# Patient Record
Sex: Female | Born: 1935
Health system: Southern US, Community
[De-identification: ages and names within clinical notes are randomized; demographics above are authoritative.]

## PROBLEM LIST (undated history)

## (undated) DIAGNOSIS — T8149XA Infection following a procedure, other surgical site, initial encounter: Secondary | ICD-10-CM

## (undated) DIAGNOSIS — G309 Alzheimer's disease, unspecified: Secondary | ICD-10-CM

## (undated) DIAGNOSIS — E7800 Pure hypercholesterolemia, unspecified: Secondary | ICD-10-CM

## (undated) DIAGNOSIS — F0280 Dementia in other diseases classified elsewhere without behavioral disturbance: Secondary | ICD-10-CM

## (undated) DIAGNOSIS — T8859XA Other complications of anesthesia, initial encounter: Secondary | ICD-10-CM

## (undated) DIAGNOSIS — T4145XA Adverse effect of unspecified anesthetic, initial encounter: Secondary | ICD-10-CM

## (undated) DIAGNOSIS — Z9289 Personal history of other medical treatment: Secondary | ICD-10-CM

## (undated) DIAGNOSIS — E119 Type 2 diabetes mellitus without complications: Secondary | ICD-10-CM

## (undated) DIAGNOSIS — J449 Chronic obstructive pulmonary disease, unspecified: Secondary | ICD-10-CM

## (undated) HISTORY — PX: DILATION AND CURETTAGE OF UTERUS: SHX78

## (undated) HISTORY — PX: CATARACT EXTRACTION W/ INTRAOCULAR LENS  IMPLANT, BILATERAL: SHX1307

---

## 1976-07-25 HISTORY — PX: CHOLECYSTECTOMY: SHX55

## 1984-07-25 HISTORY — PX: APPENDECTOMY: SHX54

## 1994-07-25 DIAGNOSIS — Z9289 Personal history of other medical treatment: Secondary | ICD-10-CM

## 1994-07-25 HISTORY — DX: Personal history of other medical treatment: Z92.89

## 1996-07-25 DIAGNOSIS — E119 Type 2 diabetes mellitus without complications: Secondary | ICD-10-CM

## 1996-07-25 HISTORY — DX: Type 2 diabetes mellitus without complications: E11.9

## 2016-06-15 ENCOUNTER — Encounter (HOSPITAL_COMMUNITY): Payer: Self-pay | Admitting: Emergency Medicine

## 2016-06-15 ENCOUNTER — Inpatient Hospital Stay (HOSPITAL_COMMUNITY)
Admission: EM | Admit: 2016-06-15 | Discharge: 2016-06-17 | DRG: 638 | Disposition: A | Discharge status: Home / Self Care | Payer: Medicare (Managed Care) | Attending: Internal Medicine | Admitting: Internal Medicine

## 2016-06-15 DIAGNOSIS — R350 Frequency of micturition: Secondary | ICD-10-CM | POA: Diagnosis present

## 2016-06-15 DIAGNOSIS — G309 Alzheimer's disease, unspecified: Secondary | ICD-10-CM | POA: Diagnosis present

## 2016-06-15 DIAGNOSIS — Z9049 Acquired absence of other specified parts of digestive tract: Secondary | ICD-10-CM

## 2016-06-15 DIAGNOSIS — N390 Urinary tract infection, site not specified: Secondary | ICD-10-CM | POA: Diagnosis present

## 2016-06-15 DIAGNOSIS — E1165 Type 2 diabetes mellitus with hyperglycemia: Principal | ICD-10-CM | POA: Diagnosis present

## 2016-06-15 DIAGNOSIS — E11621 Type 2 diabetes mellitus with foot ulcer: Secondary | ICD-10-CM | POA: Diagnosis present

## 2016-06-15 DIAGNOSIS — E7800 Pure hypercholesterolemia, unspecified: Secondary | ICD-10-CM | POA: Diagnosis present

## 2016-06-15 DIAGNOSIS — J449 Chronic obstructive pulmonary disease, unspecified: Secondary | ICD-10-CM | POA: Diagnosis present

## 2016-06-15 DIAGNOSIS — N3000 Acute cystitis without hematuria: Secondary | ICD-10-CM | POA: Diagnosis not present

## 2016-06-15 DIAGNOSIS — Z91013 Allergy to seafood: Secondary | ICD-10-CM

## 2016-06-15 DIAGNOSIS — Z794 Long term (current) use of insulin: Secondary | ICD-10-CM

## 2016-06-15 DIAGNOSIS — Z9842 Cataract extraction status, left eye: Secondary | ICD-10-CM

## 2016-06-15 DIAGNOSIS — L89899 Pressure ulcer of other site, unspecified stage: Secondary | ICD-10-CM | POA: Diagnosis not present

## 2016-06-15 DIAGNOSIS — Z880 Allergy status to penicillin: Secondary | ICD-10-CM

## 2016-06-15 DIAGNOSIS — Z885 Allergy status to narcotic agent status: Secondary | ICD-10-CM

## 2016-06-15 DIAGNOSIS — R262 Difficulty in walking, not elsewhere classified: Secondary | ICD-10-CM

## 2016-06-15 DIAGNOSIS — Z820 Family history of epilepsy and other diseases of the nervous system: Secondary | ICD-10-CM

## 2016-06-15 DIAGNOSIS — Z1623 Resistance to quinolones and fluoroquinolones: Secondary | ICD-10-CM | POA: Diagnosis present

## 2016-06-15 DIAGNOSIS — L89612 Pressure ulcer of right heel, stage 2: Secondary | ICD-10-CM | POA: Diagnosis present

## 2016-06-15 DIAGNOSIS — R32 Unspecified urinary incontinence: Secondary | ICD-10-CM | POA: Diagnosis present

## 2016-06-15 DIAGNOSIS — E119 Type 2 diabetes mellitus without complications: Secondary | ICD-10-CM | POA: Diagnosis present

## 2016-06-15 DIAGNOSIS — L89892 Pressure ulcer of other site, stage 2: Secondary | ICD-10-CM | POA: Diagnosis not present

## 2016-06-15 DIAGNOSIS — F0280 Dementia in other diseases classified elsewhere without behavioral disturbance: Secondary | ICD-10-CM | POA: Diagnosis present

## 2016-06-15 DIAGNOSIS — E878 Other disorders of electrolyte and fluid balance, not elsewhere classified: Secondary | ICD-10-CM | POA: Diagnosis present

## 2016-06-15 DIAGNOSIS — L89891 Pressure ulcer of other site, stage 1: Secondary | ICD-10-CM | POA: Diagnosis present

## 2016-06-15 DIAGNOSIS — Z9841 Cataract extraction status, right eye: Secondary | ICD-10-CM

## 2016-06-15 DIAGNOSIS — L89152 Pressure ulcer of sacral region, stage 2: Secondary | ICD-10-CM | POA: Diagnosis present

## 2016-06-15 DIAGNOSIS — M6281 Muscle weakness (generalized): Secondary | ICD-10-CM

## 2016-06-15 DIAGNOSIS — R739 Hyperglycemia, unspecified: Secondary | ICD-10-CM | POA: Diagnosis not present

## 2016-06-15 DIAGNOSIS — Z961 Presence of intraocular lens: Secondary | ICD-10-CM | POA: Diagnosis present

## 2016-06-15 DIAGNOSIS — E875 Hyperkalemia: Secondary | ICD-10-CM | POA: Diagnosis present

## 2016-06-15 DIAGNOSIS — E871 Hypo-osmolality and hyponatremia: Secondary | ICD-10-CM | POA: Diagnosis present

## 2016-06-15 HISTORY — DX: Personal history of other medical treatment: Z92.89

## 2016-06-15 HISTORY — DX: Type 2 diabetes mellitus without complications: E11.9

## 2016-06-15 HISTORY — DX: Other complications of anesthesia, initial encounter: T88.59XA

## 2016-06-15 HISTORY — DX: Pure hypercholesterolemia, unspecified: E78.00

## 2016-06-15 HISTORY — DX: Adverse effect of unspecified anesthetic, initial encounter: T41.45XA

## 2016-06-15 HISTORY — DX: Chronic obstructive pulmonary disease, unspecified: J44.9

## 2016-06-15 HISTORY — DX: Alzheimer's disease, unspecified: G30.9

## 2016-06-15 HISTORY — DX: Dementia in other diseases classified elsewhere, unspecified severity, without behavioral disturbance, psychotic disturbance, mood disturbance, and anxiety: F02.80

## 2016-06-15 LAB — CBC WITH DIFFERENTIAL/PLATELET
Basophils Absolute: 0 K/uL (ref 0.0–0.1)
Basophils Relative: 0 %
Eosinophils Absolute: 0 K/uL (ref 0.0–0.7)
Eosinophils Relative: 1 %
HCT: 41.8 % (ref 36.0–46.0)
Hemoglobin: 14 g/dL (ref 12.0–15.0)
Lymphocytes Relative: 22 %
Lymphs Abs: 1.9 K/uL (ref 0.7–4.0)
MCH: 30.4 pg (ref 26.0–34.0)
MCHC: 33.5 g/dL (ref 30.0–36.0)
MCV: 90.9 fL (ref 78.0–100.0)
Monocytes Absolute: 1 K/uL (ref 0.1–1.0)
Monocytes Relative: 11 %
Neutro Abs: 5.7 K/uL (ref 1.7–7.7)
Neutrophils Relative %: 66 %
Platelets: 223 K/uL (ref 150–400)
RBC: 4.6 MIL/uL (ref 3.87–5.11)
RDW: 13.9 % (ref 11.5–15.5)
WBC: 8.6 K/uL (ref 4.0–10.5)

## 2016-06-15 LAB — BASIC METABOLIC PANEL WITH GFR
Anion gap: 11 (ref 5–15)
BUN: 22 mg/dL — ABNORMAL HIGH (ref 6–20)
CO2: 25 mmol/L (ref 22–32)
Calcium: 10 mg/dL (ref 8.9–10.3)
Chloride: 97 mmol/L — ABNORMAL LOW (ref 101–111)
Creatinine, Ser: 0.94 mg/dL (ref 0.44–1.00)
GFR calc Af Amer: >60 mL/min
GFR calc non Af Amer: 54 mL/min — ABNORMAL LOW
Glucose, Bld: 503 mg/dL (ref 65–99)
Potassium: 5.4 mmol/L — ABNORMAL HIGH (ref 3.5–5.1)
Sodium: 133 mmol/L — ABNORMAL LOW (ref 135–145)

## 2016-06-15 LAB — URINALYSIS, ROUTINE W REFLEX MICROSCOPIC
Bilirubin Urine: NEGATIVE
Glucose, UA: >1000 mg/dL — AB
Ketones, ur: NEGATIVE mg/dL
LEUKOCYTES UA: NEGATIVE
Nitrite: POSITIVE — AB
PROTEIN: NEGATIVE mg/dL
Specific Gravity, Urine: 1.029 (ref 1.005–1.030)
pH: 5.5 (ref 5.0–8.0)

## 2016-06-15 LAB — URINE MICROSCOPIC-ADD ON

## 2016-06-15 LAB — I-STAT VENOUS BLOOD GAS, ED
Acid-Base Excess: 4 mmol/L — ABNORMAL HIGH (ref 0.0–2.0)
Bicarbonate: 30.3 mmol/L — ABNORMAL HIGH (ref 20.0–28.0)
O2 Saturation: 26 %
TCO2: 32 mmol/L (ref 0–100)
pCO2, Ven: 50.3 mmHg (ref 44.0–60.0)
pH, Ven: 7.387 (ref 7.250–7.430)
pO2, Ven: 18 mmHg — CL (ref 32.0–45.0)

## 2016-06-15 LAB — GLUCOSE, CAPILLARY: Glucose-Capillary: 308 mg/dL — ABNORMAL HIGH (ref 65–99)

## 2016-06-15 LAB — CBG MONITORING, ED
Glucose-Capillary: 509 mg/dL (ref 65–99)
Glucose-Capillary: 346 mg/dL — ABNORMAL HIGH (ref 65–99)

## 2016-06-15 MED ORDER — ACETAMINOPHEN 650 MG RE SUPP: 650.0000 mg | Freq: Four times a day (QID) | RECTAL | Status: DC | PRN

## 2016-06-15 MED ORDER — INSULIN ASPART 100 UNIT/ML ~~LOC~~ SOLN
0.0000 [IU] | SUBCUTANEOUS | Status: DC
  Administered 2016-06-15: 7 [IU] via SUBCUTANEOUS
  Administered 2016-06-16: 3 [IU] via SUBCUTANEOUS
  Administered 2016-06-16: 1 [IU] via SUBCUTANEOUS
  Administered 2016-06-16: 5 [IU] via SUBCUTANEOUS
  Administered 2016-06-16 – 2016-06-17 (×3): 9 [IU] via SUBCUTANEOUS
  Administered 2016-06-17: 5 [IU] via SUBCUTANEOUS
  Administered 2016-06-17: 3 [IU] via SUBCUTANEOUS

## 2016-06-15 MED ORDER — ENSURE ENLIVE PO LIQD
237.0000 mL | Freq: Two times a day (BID) | ORAL | Status: DC
  Administered 2016-06-16: 237 mL via ORAL

## 2016-06-15 MED ORDER — ENOXAPARIN SODIUM 30 MG/0.3ML ~~LOC~~ SOLN
30.0000 mg | SUBCUTANEOUS | Status: DC
Start: 2016-06-15 — End: 2016-06-17
  Administered 2016-06-15: 30 mg via SUBCUTANEOUS
  Filled 2016-06-15 (×2): qty 0.3

## 2016-06-15 MED ORDER — ACETAMINOPHEN 325 MG PO TABS
650.0000 mg | ORAL_TABLET | Freq: Four times a day (QID) | ORAL | Status: DC | PRN
  Administered 2016-06-16: 650 mg via ORAL
  Filled 2016-06-15: qty 2

## 2016-06-15 MED ORDER — ONDANSETRON HCL 4 MG PO TABS: 4.0000 mg | ORAL_TABLET | Freq: Four times a day (QID) | ORAL | Status: DC | PRN

## 2016-06-15 MED ORDER — CLONAZEPAM 1 MG PO TABS
1.0000 mg | ORAL_TABLET | Freq: Every day | ORAL | Status: DC | PRN
  Administered 2016-06-15: 1 mg via ORAL
  Filled 2016-06-15: qty 1

## 2016-06-15 MED ORDER — SODIUM CHLORIDE 0.9 % IV BOLUS (SEPSIS)
1000.0000 mL | Freq: Once | INTRAVENOUS | Status: AC
  Administered 2016-06-15: 1000 mL via INTRAVENOUS

## 2016-06-15 MED ORDER — ONDANSETRON HCL 4 MG/2ML IJ SOLN: 4.0000 mg | Freq: Four times a day (QID) | INTRAMUSCULAR | Status: DC | PRN

## 2016-06-15 MED ORDER — SODIUM CHLORIDE 0.9 % IV SOLN: INTRAVENOUS | Status: AC

## 2016-06-15 MED ORDER — CIPROFLOXACIN HCL 500 MG PO TABS
250.0000 mg | ORAL_TABLET | Freq: Two times a day (BID) | ORAL | Status: DC
  Administered 2016-06-15 – 2016-06-17 (×3): 250 mg via ORAL
  Filled 2016-06-15 (×4): qty 1

## 2016-06-15 MED ORDER — SENNA 8.6 MG PO TABS
1.0000 | ORAL_TABLET | Freq: Two times a day (BID) | ORAL | Status: DC
  Administered 2016-06-15 – 2016-06-17 (×3): 8.6 mg via ORAL
  Filled 2016-06-15 (×4): qty 1
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
## 2016-06-16 DIAGNOSIS — L89152 Pressure ulcer of sacral region, stage 2: Secondary | ICD-10-CM | POA: Diagnosis present

## 2016-06-16 DIAGNOSIS — N3000 Acute cystitis without hematuria: Secondary | ICD-10-CM

## 2016-06-16 DIAGNOSIS — L8992 Pressure ulcer of unspecified site, stage 2: Secondary | ICD-10-CM

## 2016-06-16 DIAGNOSIS — L89899 Pressure ulcer of other site, unspecified stage: Secondary | ICD-10-CM

## 2016-06-16 DIAGNOSIS — L89892 Pressure ulcer of other site, stage 2: Secondary | ICD-10-CM

## 2016-06-16 DIAGNOSIS — L89891 Pressure ulcer of other site, stage 1: Secondary | ICD-10-CM | POA: Diagnosis present

## 2016-06-16 DIAGNOSIS — R739 Hyperglycemia, unspecified: Secondary | ICD-10-CM

## 2016-06-16 DIAGNOSIS — L89109 Pressure ulcer of unspecified part of back, unspecified stage: Secondary | ICD-10-CM

## 2016-06-16 DIAGNOSIS — R7309 Other abnormal glucose: Secondary | ICD-10-CM

## 2016-06-16 DIAGNOSIS — N390 Urinary tract infection, site not specified: Secondary | ICD-10-CM | POA: Diagnosis present

## 2016-06-16 LAB — GLUCOSE, CAPILLARY
GLUCOSE-CAPILLARY: 129 mg/dL — AB (ref 65–99)
GLUCOSE-CAPILLARY: 282 mg/dL — AB (ref 65–99)
Glucose-Capillary: 206 mg/dL — ABNORMAL HIGH (ref 65–99)
Glucose-Capillary: 456 mg/dL — ABNORMAL HIGH (ref 65–99)
Glucose-Capillary: 374 mg/dL — ABNORMAL HIGH (ref 65–99)

## 2016-06-16 LAB — BASIC METABOLIC PANEL
ANION GAP: 8 (ref 5–15)
BUN: 21 mg/dL — ABNORMAL HIGH (ref 6–20)
CALCIUM: 9 mg/dL (ref 8.9–10.3)
CO2: 23 mmol/L (ref 22–32)
Chloride: 105 mmol/L (ref 101–111)
Creatinine, Ser: 1.02 mg/dL — ABNORMAL HIGH (ref 0.44–1.00)
GFR calc non Af Amer: 49 mL/min — ABNORMAL LOW (ref >60)
GFR, EST AFRICAN AMERICAN: 56 mL/min — AB (ref >60)
GLUCOSE: 257 mg/dL — AB (ref 65–99)
POTASSIUM: 3.6 mmol/L (ref 3.5–5.1)
Sodium: 136 mmol/L (ref 135–145)

## 2016-06-16 LAB — CBC
HEMATOCRIT: 37.3 % (ref 36.0–46.0)
HEMOGLOBIN: 12.6 g/dL (ref 12.0–15.0)
MCH: 30.6 pg (ref 26.0–34.0)
MCHC: 33.8 g/dL (ref 30.0–36.0)
MCV: 90.5 fL (ref 78.0–100.0)
Platelets: 177 10*3/uL (ref 150–400)
RBC: 4.12 MIL/uL (ref 3.87–5.11)
RDW: 14.1 % (ref 11.5–15.5)
WBC: 6.6 10*3/uL (ref 4.0–10.5)

## 2016-06-16 MED ORDER — LORAZEPAM 2 MG/ML IJ SOLN: 1.0000 mg | Freq: Once | INTRAMUSCULAR | Status: DC

## 2016-06-16 MED ORDER — HALOPERIDOL LACTATE 5 MG/ML IJ SOLN
2.0000 mg | Freq: Once | INTRAMUSCULAR | Status: AC
  Administered 2016-06-16: 2 mg via INTRAMUSCULAR
  Filled 2016-06-16: qty 1

## 2016-06-16 MED ORDER — GLUCERNA SHAKE PO LIQD
237.0000 mL | Freq: Two times a day (BID) | ORAL | Status: DC
  Administered 2016-06-16 – 2016-06-17 (×2): 237 mL via ORAL
  Filled 2016-06-16: qty 237

## 2016-06-16 MED ORDER — INSULIN ASPART 100 UNIT/ML ~~LOC~~ SOLN
12.0000 [IU] | Freq: Once | SUBCUTANEOUS | Status: AC
Start: 2016-06-16 — End: 2016-06-16
  Administered 2016-06-16: 12 [IU] via SUBCUTANEOUS

## 2016-06-16 NOTE — Progress Notes (Signed)
Paged MD at [phone] and [phone] to inform of CBG of 456.   Dr. Samuella CotaSvalina up on the floor and new orders to be placed.  Will continue to monitor.  Forbes Cellarelcine Jeweline Reif, RN
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
## 2016-06-16 NOTE — Progress Notes (Signed)
   Subjective:  Patient sleeping - information gathered from daughter. She states that Ms. Herzberg has not complained about anything since admission. Daughter had questions about wound care, what to look out for when going home in terms of change in status of wounds, and having the patient sit up and ambulated. We explained that wound care was going to come by and give suggestions for care of her pressure ulcers, but that right now they don't appear infected and are healing well. PT was consulted to evaluate patient - though with holiday scheduling, unsure if they will come today; patient may be up with assistance with nursing and ambulated.  We began discussion on importance of diabetes management and taking insulin on a regular schedule. Daughter understands that we will keep monitoring her sugars to be able to evaluate her insulin need at discharge.   Objective:  Vital signs in last 24 hours: Vitals:   06/15/16 1246 06/15/16 1314 06/15/16 1700 06/16/16 0513  BP: 159/71 161/97 (!) 165/69 (!) 165/56  Pulse: 76 78 61 (!) 55  Resp: 17 17 17 18   Temp:  98.8 F (37.1 C) 99 F (37.2 C) 97.5 F (36.4 C)  TempSrc:  Oral Oral Oral  SpO2: 99% 99% 98% 98%  Weight:   48.4 kg (106 lb 11.2 oz)    Constitutional: asleep, vs reviewed CV: RRR, LUSB systolic murmur, no rubs or gallops appreciated Resp: CTAB, no increased work of breathing, no wheezing or crackles Abd: soft, NDNT, +BS Skin: dressings in place over R heel and sacrum  Assessment/Plan:  Active Problems:   Hyperglycemia   Pressure injury of skin  Hyperglycemia: Checking CBG's --CBG and SSI-S q4hr to establish insulin requirement --started discussion on management of diabetes in this patient with family who administer her meds and care for her; diabetes educator consulted for additional help --f/u AM BMP  UTI:  Patient with increased frequency of urination and a UA positive for nitrites and bacteria (also squamous cells)  consistent with UTI.  --cipro 250mg  BID x 7 days --f/u UCx  Pressure ulcers: Patient with well healing stage 2 pressure ulcers on sacrum and R heel that do not appear to be infected. Patient is afebrile and without leukocytosis.  --wound care consulted for suggestions  Dementia: Patient with dementia x 13 years, thought to be Alzheimer's by family. She has never had a stroke to their knowledge. Patient is dependent on family for ADL's but can assist with feeding. With severe dementia and uncontrolled diabetes likely due to lack of knowledge of management, patient [facility].  --monitor for delirium --PT/OT consult - appreciate their assistance  --will address resources family might need for caring of patient   Dispo: Anticipated discharge in approximately 1 day(s).   Nyra MarketGorica Johnny Latu, MD 06/16/2016, 12:06 PM Pager [phone]

## 2016-06-16 NOTE — Progress Notes (Signed)
Attempted CBG check x2. Pt educated and refused both times.

## 2016-06-16 NOTE — Plan of Care (Signed)
Problem: Activity: Goal: Risk for activity intolerance will decrease Outcome: Progressing Sat up in chair several times today

## 2016-06-17 LAB — BASIC METABOLIC PANEL
ANION GAP: 8 (ref 5–15)
BUN: 25 mg/dL — ABNORMAL HIGH (ref 6–20)
CHLORIDE: 104 mmol/L (ref 101–111)
CO2: 25 mmol/L (ref 22–32)
Calcium: 9.8 mg/dL (ref 8.9–10.3)
Creatinine, Ser: 0.94 mg/dL (ref 0.44–1.00)
GFR calc Af Amer: >60 mL/min (ref >60)
GFR, EST NON AFRICAN AMERICAN: 54 mL/min — AB (ref >60)
Glucose, Bld: 283 mg/dL — ABNORMAL HIGH (ref 65–99)
POTASSIUM: 3.9 mmol/L (ref 3.5–5.1)
SODIUM: 137 mmol/L (ref 135–145)

## 2016-06-17 LAB — URINE CULTURE: Culture: >=100000 — AB

## 2016-06-17 LAB — GLUCOSE, CAPILLARY
GLUCOSE-CAPILLARY: 208 mg/dL — AB (ref 65–99)
GLUCOSE-CAPILLARY: 362 mg/dL — AB (ref 65–99)
GLUCOSE-CAPILLARY: 369 mg/dL — AB (ref 65–99)
Glucose-Capillary: 282 mg/dL — ABNORMAL HIGH (ref 65–99)

## 2016-06-17 MED ORDER — NITROFURANTOIN MONOHYD MACRO 100 MG PO CAPS
100.0000 mg | ORAL_CAPSULE | Freq: Two times a day (BID) | ORAL | Status: DC
  Administered 2016-06-17: 100 mg via ORAL
  Filled 2016-06-17 (×2): qty 1

## 2016-06-17 MED ORDER — INSULIN ASPART PROT & ASPART (70-30 MIX) 100 UNIT/ML ~~LOC~~ SUSP: SUBCUTANEOUS | 11 refills | Status: DC

## 2016-06-17 MED ORDER — BACITRACIN-NEOMYCIN-POLYMYXIN OINTMENT TUBE: 1.0000 "application " | TOPICAL_OINTMENT | Freq: Every day | CUTANEOUS | 6 refills | Status: DC

## 2016-06-17 MED ORDER — BACITRACIN-NEOMYCIN-POLYMYXIN OINTMENT TUBE
TOPICAL_OINTMENT | Freq: Every day | CUTANEOUS | Status: DC
  Filled 2016-06-17: qty 15

## 2016-06-17 MED ORDER — ADULT MULTIVITAMIN W/MINERALS CH: 1.0000 | ORAL_TABLET | Freq: Every day | ORAL | Status: DC

## 2016-06-17 MED ORDER — ADULT MULTIVITAMIN W/MINERALS CH: 1.0000 | ORAL_TABLET | Freq: Every day | ORAL | 5 refills | Status: DC

## 2016-06-17 MED ORDER — NITROFURANTOIN MONOHYD MACRO 100 MG PO CAPS: 100.0000 mg | ORAL_CAPSULE | Freq: Two times a day (BID) | ORAL | 0 refills | Status: AC

## 2016-06-17 MED ORDER — INSULIN GLARGINE 100 UNIT/ML ~~LOC~~ SOLN
20.0000 [IU] | Freq: Every day | SUBCUTANEOUS | Status: DC
  Filled 2016-06-17: qty 0.2

## 2016-06-17 MED ORDER — GLUCERNA SHAKE PO LIQD: 237.0000 mL | Freq: Two times a day (BID) | ORAL | 2 refills | Status: AC

## 2016-06-17 MED ORDER — PRO-STAT SUGAR FREE PO LIQD: 30.0000 mL | Freq: Two times a day (BID) | ORAL | Status: DC

## 2016-06-17 NOTE — Consult Note (Addendum)
WOC Nurse wound consult note Reason for Consult: Consult requested for sacrum and right heel.  Family member states these developed in another hospital prior to admission.  They have been using Neosporin to the right heel and Dermagraph ointment to sacrum Q day to promote healing. This topical treatment is not available in the [facility] formulary; advised family they can bring it in from home for use every day if desired. Pt is very emaciated and has multiple systemic factors which can impair healing. Wound type: Sacrum with stage 3 pressure injury; 5X3.5X.1cm, 90% red and moist, 10% yellow.  Small amt yellow drainage, no odor Right heel with unstageable pressure injury; 5X2cm, 80% red, 20% yellow slough, small amt yellow drainage, no odor. Pressure Ulcer POA: Yes Dressing procedure/placement/frequency: Assessed wounds at the bedside with family member. Continue present plan of care as discussed above.  Float heels to reduce pressure and position off sacrum as often as possible.  Encouraged patient to increase protein intake.  Family denies further questions. Please re-consult if further assistance is needed.  Thank-you,  Cammie Mcgeeawn Kayline Sheer MSN, RN, CWOCN, [place]AP, CNS [phone]

## 2016-06-17 NOTE — Progress Notes (Signed)
   06/17/16 1045  Clinical Encounter Type  Visited With Patient and family together  Visit Type Other (Comment) ([place] consult)  Referral From Nurse  Consult/Referral To Chaplain  Spiritual Encounters  Spiritual Needs Emotional;Prayer  Stress Factors  Patient Stress Factors Health changes  Family Stress Factors Family relationships  Introductions made. Informed Pt and daughter that notary and volunteers unavailable to complete Ad. Provided copy and explained procedures. Offered prayer during visit.

## 2016-06-17 NOTE — Progress Notes (Signed)
Inpatient Diabetes Program Recommendations  AACE/ADA: New Consensus Statement on Inpatient Glycemic Control (2015)  Target Ranges:  Prepandial:   less than 140 mg/dL      Peak postprandial:   less than 180 mg/dL (1-2 hours)      Critically ill patients:  140 - 180 mg/dL   Lab Results  Component Value Date   GLUCAP 282 (H) 06/17/2016   HGBA1C 10.5 (H) 06/15/2016    Review of Glycemic Control:  Results for Sharon BeringGOSTO, Sharon (MRN [id number]) as of 06/17/2016 08:11  Ref. Range 06/16/2016 08:22 06/16/2016 12:25 06/16/2016 16:46 06/17/2016 00:23 06/17/2016 04:51  Glucose-Capillary Latest Ref Range: 65 - 99 mg/dL 409129 (H) 811456 (H) 914374 (H) 369 (H) 282 (H)    Diabetes history: Type 2 diabetes Outpatient Diabetes medications: Lantus 20 units daily-prn when on trips?, Humalog 15 units daily prn? Current orders for Inpatient glycemic control:  Novolog sensitive q 4 hours  Inpatient Diabetes Program Recommendations:   Please consider restarting a portion of Lantus.  Consider Lantus 12 units daily.  Will talk to family/patient today.   Thanks, Beryl MeagerJenny Amberlee Garvey, RN, BC-ADM Inpatient Diabetes Coordinator Pager [phone] (8a-5p)

## 2016-06-17 NOTE — Clinical Social Work Note (Signed)
CSW consulted for "Dispo" PT is not recommending any follow up. CSW updated RNCM. CSW is signing off as no further needs identified.   Dede QuerySarah Danyle Boening, MSW, LCSW Clinical Social Worker  [phone]
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
## 2016-06-17 NOTE — Progress Notes (Signed)
  Date: 06/17/2016  Patient name: Sharon Butler  Medical record number: [id number]  Date of birth: 09/29/1928   I have personally seen and evaluated this patient and the plan of care was discussed with the house staff. Please see Dr. Kandice HamsSvalina's note for complete details. I concur with her findings.  Inez CatalinaEmily B Mullen, MD 06/17/2016, 11:31 AM

## 2016-06-17 NOTE — Care Management Note (Signed)
Case Management Note  Patient Details  Name: Fabio BeringRamona Corradi MRN: [id number] Date of Birth: 09/29/1928  Subjective/Objective:                 Spoke with patients daughter at the bedside. Patient recently moved from [place] to live with daughter. Patient has "[employer] Medicare" per daughter in [place]. Insurance [facility]. Per clinical liaison, insurance may be approved Monday. CM discussed with daughter priorities g=for home health, as she may end up as self pay. Daughter stated she felt like patient only needed a few RN visits to address sacral decub, PT was not necessary. (CM witnessed pt get up OOB to straighten out sheets and return to bed, steady and unassisted.) Daughter states that the walker and rolator they have at home already would be sufficient and declined need for additional DME.  Spoke with resident, after she spoke with daughter in hallway. Plan is for daughter to call FM clinic to schedule appointment. Daughter also given clinical liaison for [facility] direct number to call Monday after she establishes her medicare so that [facility] either private pay or through insurance can be confirmed.   Action/Plan:  DC to home with daughter.   Expected Discharge Date:                  Expected Discharge Plan:  Home/Self Care  In-House Referral:  Clinical Social Work  Discharge planning Services  CM Consult  Post Acute Care Choice:    Choice offered to:  Adult Children  DME Arranged:    DME Agency:     HH Arranged:    HH Agency:     Status of Service:  Completed, signed off  If discussed at Long Length of Stay Meetings, dates discussed:    Additional Comments:  Lawerance SabalDebbie Yuchen Fedor, RN 06/17/2016, 4:48 PM

## 2016-06-17 NOTE — Progress Notes (Signed)
   Subjective:  Patient required 40U insulin over the last 24hrs. Patient is sleeping so history obtained from husband. He states that she has refused some medicines yesterday. This is frustrating to him as he does not think that she is getting anything here compared to at home.  Objective:  Vital signs in last 24 hours: Vitals:   06/15/16 1700 06/16/16 0513 06/16/16 1459 06/17/16 0455  BP: (!) 165/69 (!) 165/56 (!) 138/44 (!) 137/51  Pulse: 61 (!) 55 75 63  Resp: 17 18 20 18   Temp: 99 F (37.2 C) 97.5 F (36.4 C) 98.5 F (36.9 C) 98.1 F (36.7 C)  TempSrc: Oral Oral Oral   SpO2: 98% 98% 97% 96%  Weight: 48.4 kg (106 lb 11.2 oz)   48.2 kg (106 lb 3.2 oz)   Constitutional: asleep, vs reviewed CV: RRR, LUSB systolic murmur, no rubs or gallops appreciated Resp: CTAB, no increased work of breathing, no wheezing or crackles Abd: soft, NDNT, +BS Skin: dressings in place over R heel and sacrum  Assessment/Plan:  Active Problems:   Hyperglycemia   Pressure injury of skin   Decubitus ulcer of right foot, stage 2   Decubitus ulcer of sacral region, stage 2   UTI (urinary tract infection)  Hyperglycemia: Checking CBG's; patient refused a couple of CBG checks, but we were still able to get a good amount of info; She required 40U insulin over the last 24hrs. Will start Lantus 20U qhs; and meal time SSI. At discharge, plan on Lantus and metformin with close f/u with PCP for titration of meds --started discussion on management of diabetes in this patient with family who administer her meds and care for her; diabetes educator consulted for additional help --Lantus 20U, SSI-s TID wc --at discharge, Lantus 20U qhs, metformin 500mg  daily to be titrated up with PCP  UTI:  Patient with increased frequency of urination and a UA positive for nitrites and bacteria (also squamous cells) consistent with UTI. Culture positive for cipro and bactrim resistant E coli; patient had anaphylactic reaction  to penicillin so will start macrobid --macrobid 100mg  BID x 7days  Pressure ulcers: Patient with well healing stage 2 pressure ulcers on sacrum and R heel that do not appear to be infected. Patient is afebrile and without leukocytosis.  --wound care consulted for suggestions  Dementia: Patient with dementia x 13 years, thought to be Alzheimer's by family. She has never had a stroke to their knowledge. Patient is dependent on family for ADL's but can assist with feeding. With severe dementia and uncontrolled diabetes likely due to lack of knowledge of management, patient will likely [facility].  --monitor for delirium --PT/OT consult - appreciate their assistance  --will address resources family might need for caring of patient; they [facility]/RN  Dispo: Anticipated discharge today.   Nyra MarketGorica Joely Losier, MD 06/17/2016, 7:27 AM Pager [phone]

## 2016-06-17 NOTE — Evaluation (Signed)
Occupational Therapy Evaluation Patient Details Name: Sharon Butler MRN: [id number] DOB: 09/29/1928 Today's Date: 06/17/2016    History of Present Illness 82 yo F with a significant PMH of T2DM, currently on insulin, and Alzheimer's that presented to the ED with hyperglycemia.   Clinical Impression   Per family, pt has been receiving assist with sponge bathing and dressing PTA. Currently requires mod assist for functional mobility with cues for sequencing and safety. Pt able to perform ADL at overall min assist. Pt planning to d/c home with 24/7 supervision from family. Pt would benefit from continued skilled OT to address established goals.    Follow Up Recommendations  No OT follow up;Supervision/Assistance - 24 hour    Equipment Recommendations  None recommended by OT    Recommendations for Other Services       Precautions / Restrictions Precautions Precautions: Fall Restrictions Weight Bearing Restrictions: No      Mobility Bed Mobility Overal bed mobility: Needs Assistance Bed Mobility: Supine to Sit     Supine to sit: Min assist     General bed mobility comments: Min hand held assist to pull up into sitting position.  Transfers Overall transfer level: Needs assistance Equipment used: 1 person hand held assist Transfers: Sit to/from UGI CorporationStand;Stand Pivot Transfers Sit to Stand: Min assist Stand pivot transfers: Mod assist       General transfer comment: Cues for seqencing and safety.    Balance Overall balance assessment: Needs assistance Sitting-balance support: Feet supported;No upper extremity supported Sitting balance-Leahy Scale: Good     Standing balance support: Single extremity supported Standing balance-Leahy Scale: Fair                              ADL Overall ADL's : Needs assistance/impaired Eating/Feeding: Set up;Sitting   Grooming: Min guard;Sitting   Upper Body Bathing: Minimal assitance;Sitting   Lower Body Bathing:  Minimal assistance;Sit to/from stand Lower Body Bathing Details (indicate cue type and reason): Pt able to wash LB sitting on BSC then with min assist in standing for balance Upper Body Dressing : Min guard;Sitting Upper Body Dressing Details (indicate cue type and reason): to don gown Lower Body Dressing: Maximal assistance;Bed level Lower Body Dressing Details (indicate cue type and reason): to don/doff socks due to sore on R heel. Toilet Transfer: Moderate assistance;Stand-pivot;BSC;Cueing for sequencing   Toileting- Clothing Manipulation and Hygiene: Moderate assistance;Sit to/from stand Toileting - Clothing Manipulation Details (indicate cue type and reason): pt able to perform peri care in standing with min assist for balance. Assist to start underwear over feet; pt able to pull up in standing.     Functional mobility during ADLs: Moderate assistance;Rolling walker (hand held assist)       Vision Additional Comments: Appears WFL.   Perception     Praxis      Pertinent Vitals/Pain Pain Assessment: No/denies pain     Hand Dominance     Extremity/Trunk Assessment Upper Extremity Assessment Upper Extremity Assessment: Generalized weakness   Lower Extremity Assessment Lower Extremity Assessment: Defer to PT evaluation   Cervical / Trunk Assessment Cervical / Trunk Assessment: Kyphotic   Communication Communication Communication: No difficulties   Cognition Arousal/Alertness: Awake/alert Behavior During Therapy: WFL for tasks assessed/performed Overall Cognitive Status: History of cognitive impairments - at baseline                     General Comments  Exercises       Shoulder Instructions      Home Living Family/patient expects to be discharged to:: Private residence Living Arrangements: Children;Spouse/significant other Available Help at Discharge: Family;Available 24 hours/day Type of Home: House Home Access: Stairs to enter ITT IndustriesEntrance  Stairs-Number of Steps: 3 Entrance Stairs-Rails: None Home Layout: One level     Bathroom Shower/Tub: Chief Strategy OfficerTub/shower unit   Bathroom Toilet: Standard     Home Equipment: Environmental consultantWalker - 4 wheels;Cane - single point;Bedside commode;Shower seat          Prior Functioning/Environment Level of Independence: Needs assistance  Gait / Transfers Assistance Needed: Uses rollator for mobility. ADL's / Homemaking Assistance Needed: Family assists with sponge bathing and dressing. Has fallen when trying to use the commode so now only uses BSC; incontinent PTA.            OT Problem List: Decreased strength;Decreased activity tolerance;Impaired balance (sitting and/or standing);Decreased cognition;Decreased safety awareness;Decreased knowledge of use of DME or AE;Pain   OT Treatment/Interventions: Self-care/ADL training;Energy conservation;DME and/or AE instruction;Therapeutic activities;Patient/family education    OT Goals(Current goals can be found in the care plan section) Acute Rehab OT Goals Patient Stated Goal: none stated by pt OT Goal Formulation: With patient/family Time For Goal Achievement: 07/01/16 Potential to Achieve Goals: Good ADL Goals Pt Will Perform Grooming: with supervision;standing Pt Will Perform Upper Body Bathing: with supervision;sitting Pt Will Perform Lower Body Bathing: with supervision;sit to/from stand Pt Will Perform Upper Body Dressing: with supervision;sitting Pt Will Perform Lower Body Dressing: with supervision;sit to/from stand Pt Will Transfer to Toilet: with supervision;ambulating;bedside commode Pt Will Perform Toileting - Clothing Manipulation and hygiene: with supervision;sit to/from stand  OT Frequency: Min 2X/week   Barriers to D/C:            Co-evaluation PT/OT/SLP Co-Evaluation/Treatment: Yes Reason for Co-Treatment: For patient/therapist safety;Complexity of the patient's impairments (multi-system involvement)   OT goals addressed during  session: ADL's and self-care      End of Session Equipment Utilized During Treatment: Gait belt;Rolling walker Nurse Communication: Mobility status  Activity Tolerance: Patient tolerated treatment well Patient left: in chair;with call bell/phone within reach;with chair alarm set;with family/visitor present   Time: 1025-1055 OT Time Calculation (min): 30 min Charges:  OT General Charges $OT Visit: 1 Procedure OT Evaluation $OT Eval Moderate Complexity: 1 Procedure G-Codes:     Sharon AlkenBailey A Magdelene Butler M.S., OTR/L Pager: [phone]  06/17/2016, 11:21 AM

## 2016-06-17 NOTE — Discharge Instructions (Signed)
The case manager will follow up with you to set up home health, and also to set up with a new primary care doctor. The clinics are closed today so we are unable to schedule an appointment.

## 2016-06-17 NOTE — Progress Notes (Signed)
Initial Nutrition Assessment  DOCUMENTATION CODES:   Not applicable  INTERVENTION:   -Continue Glucerna Shake po BID, each supplement provides 220 kcal and 10 grams of protein -30 ml Prostat BID, each supplement provides 100 kcals and 15 grams protein -MVI daily  NUTRITION DIAGNOSIS:   Increased nutrient needs related to wound healing as evidenced by estimated needs.  GOAL:   Patient will meet greater than or equal to 90% of their needs  MONITOR:   PO intake, Supplement acceptance, Weight trends, Skin, I & O's  REASON FOR ASSESSMENT:   Consult, Malnutrition Screening Tool Assessment of nutrition requirement/status  ASSESSMENT:   Fabio BeringRamona Bow is a 82 yo F with a significant PMH of T2DM, currently on insulin, and Alzheimer's that presented to the ED with hyperglycemia, pressure ulcers and dirty UA.  Pt admitted with hyperglycemia, pressure ulcers, and UTI.  Pt unavailable at time of visit. Unable to complete Nutrition-Focused physical exam at this time.   Per chart review, pt was recently [place] in [place] for FTT and DM management, where she developed pressure ulcers. Pt with hx of weight loss, however, has apparently regained some lost weight. On driver's license, UBW noted as 130#. Pt appeared frail and thin at time of visit. RD suspects malnutrition, however, unable to confirm at this time.   Per RN notes, pt has been refusing some medications, but does better with encouragement from family and prayer.   Per [facility] note, pt with stage II pressure injury on sacrum and UN rt heel. RD will add protein modular and MVI to promote wound healing.   Labs reviewed: CBGS: [id number].  Diet Order:  Diet Carb Modified Fluid consistency: Thin; Room service appropriate? Yes Diet - low sodium heart healthy  Skin:  Wound (see comment) (st II sacrum, UN rt heel)  Last BM:  06/14/16  Height:   Ht Readings from Last 1 Encounters:  06/17/16 5\' 2"  (1.575 m)    Weight:    Wt Readings from Last 1 Encounters:  06/17/16 106 lb 3.2 oz (48.2 kg)    Ideal Body Weight:  50 kg  BMI:  Body mass index is 19.42 kg/m.  Estimated Nutritional Needs:   Kcal:  1450-1650  Protein:  65-80 grams  Fluid:  >1.4 L  EDUCATION NEEDS:   No education needs identified at this time  Javaughn Opdahl A. Mayford KnifeWilliams, RD, LDN, CDE Pager: [phone] After hours Pager: [phone]

## 2016-06-17 NOTE — Evaluation (Signed)
Physical Therapy Evaluation Patient Details Name: Fabio BeringRamona Whetsell MRN: [id number] DOB: 09/29/1928 Today's Date: 06/17/2016   History of Present Illness  82 yo F with a significant PMH of T2DM, currently on insulin, and Alzheimer's that presented to the ED with hyperglycemia, UTI, ongoing pressure ulcer issues.  Clinical Impression  Pt is able to get up and move about the room assisted, which sounds close to her baseline given her dementia.  Daughter is very hands on with her care.  Pt would benefit from PT to follow her acutely, but will likely not need it upon d/c given her significant care at home.   PT to follow acutely for deficits listed below.       Follow Up Recommendations No PT follow up    Equipment Recommendations  None recommended by PT    Recommendations for Other Services   NA    Precautions / Restrictions Precautions Precautions: Fall Precaution Comments: h/o falls PTA, uses rollator Restrictions Weight Bearing Restrictions: No      Mobility  Bed Mobility Overal bed mobility: Needs Assistance Bed Mobility: Supine to Sit     Supine to sit: Min assist     General bed mobility comments: Min hand held assist to give pt something to pull on to get to sitting EOB.    Transfers Overall transfer level: Needs assistance Equipment used: Rolling walker (2 wheeled);1 person hand held assist Transfers: Sit to/from Stand Sit to Stand: Min assist Stand pivot transfers: Mod assist       General transfer comment: Cues for seqencing and safety, assist at trunk for balance and stability as she wants to sit prematurely.  With repeated sit to stands and transfers pt became less assistance.   Ambulation/Gait Ambulation/Gait assistance: Min assist Ambulation Distance (Feet): 25 Feet Assistive device: Rolling walker (2 wheeled);1 person hand held assist Gait Pattern/deviations: Step-through pattern;Shuffle;Staggering left;Staggering right;Trunk flexed Gait velocity:  decreased   General Gait Details: Pt with flexed trunk gait started with RW and min assist and pt switched to hand held assist (we did not have a 4 wheeled walker and this may be why) 1/2 way through gait and Pt required min hand held assist and was reaching out for stability on furniture with free hand.          Balance Overall balance assessment: Needs assistance Sitting-balance support: Feet supported;No upper extremity supported;Bilateral upper extremity supported;Single extremity supported Sitting balance-Leahy Scale: Good Sitting balance - Comments: pt able to reach to her feet while cleaning herself up on BSC   Standing balance support: Single extremity supported;Bilateral upper extremity supported;No upper extremity supported Standing balance-Leahy Scale: Fair Standing balance comment: some imbalance, but with support is even able to stand on one foot (one hand on RW).                              Pertinent Vitals/Pain Pain Assessment: No/denies pain    Home Living Family/patient expects to be discharged to:: Private residence Living Arrangements: Children;Spouse/significant other Available Help at Discharge: Family;Available 24 hours/day Type of Home: Mobile home Home Access: Stairs to enter Entrance Stairs-Rails: None Entrance Stairs-Number of Steps: 3 Home Layout: One level Home Equipment: Walker - 4 wheels;Cane - single point;Bedside commode;Shower seat      Prior Function Level of Independence: Needs assistance   Gait / Transfers Assistance Needed: Uses rollator for mobility.  ADL's / Homemaking Assistance Needed: Family assists with sponge bathing and dressing. Has  fallen when trying to use the commode so now only uses BSC; incontinent PTA.           Extremity/Trunk Assessment   Upper Extremity Assessment: Defer to OT evaluation           Lower Extremity Assessment: Generalized weakness (right heel ulcer)      Cervical / Trunk  Assessment: Kyphotic  Communication   Communication: No difficulties  Cognition Arousal/Alertness: Awake/alert Behavior During Therapy: WFL for tasks assessed/performed Overall Cognitive Status: History of cognitive impairments - at baseline                      General Comments General comments (skin integrity, edema, etc.): Daughter is very helpful, but at times a bit too helpful as the pt can do a lot for herself with guidance and set up.         Assessment/Plan    PT Assessment Patient needs continued PT services  PT Problem List Decreased strength;Decreased balance;Decreased activity tolerance;Decreased mobility;Decreased cognition;Decreased knowledge of use of DME;Decreased safety awareness;Pain          PT Treatment Interventions DME instruction;Stair training;Gait training;Functional mobility training;Therapeutic activities;Therapeutic exercise;Balance training;Patient/family education;Cognitive remediation    PT Goals (Current goals can be found in the Care Plan section)  Acute Rehab PT Goals Patient Stated Goal: daughter would like to take her home at d/c PT Goal Formulation: With patient/family Time For Goal Achievement: 07/01/16 Potential to Achieve Goals: Good    Frequency Min 3X/week        Co-evaluation PT/OT/SLP Co-Evaluation/Treatment: Yes Reason for Co-Treatment: For patient/therapist safety;Necessary to address cognition/behavior during functional activity;Complexity of the patient's impairments (multi-system involvement) PT goals addressed during session: Mobility/safety with mobility;Balance;Proper use of DME;Strengthening/ROM OT goals addressed during session: ADL's and self-care       End of Session Equipment Utilized During Treatment: Gait belt Activity Tolerance: Patient tolerated treatment well Patient left: in chair;with call bell/phone within reach;with chair alarm set;with family/visitor present Nurse Communication: Mobility  status         Time: 1025-1055 PT Time Calculation (min) (ACUTE ONLY): 30 min   Charges:   PT Evaluation $PT Eval Moderate Complexity: 1 Procedure          Aleayah Chico B. Shirline Kendle, PT, DPT [phone]   06/17/2016, 11:43 AM

## 2016-06-17 NOTE — Progress Notes (Addendum)
Inpatient Diabetes Program Recommendations  AACE/ADA: New Consensus Statement on Inpatient Glycemic Control (2015)  Target Ranges:  Prepandial:   less than 140 mg/dL      Peak postprandial:   less than 180 mg/dL (1-2 hours)      Critically ill patients:  140 - 180 mg/dL   Lab Results  Component Value Date   GLUCAP 362 (H) 06/17/2016   HGBA1C 10.5 (H) 06/15/2016    Review of Glycemic ControlResults for Fabio BeringGOSTO, Shayda (MRN [id number]) as of 06/17/2016 13:47  Ref. Range 06/16/2016 08:22 06/16/2016 12:25 06/16/2016 16:46 06/17/2016 00:23 06/17/2016 04:51 06/17/2016 08:49 06/17/2016 11:56  Glucose-Capillary Latest Ref Range: 65 - 99 mg/dL 409129 (H) 811456 (H) 914374 (H) 369 (H) 282 (H) 208 (H) 362 (H)   Witnessed RN attempting to give patient insulin.  Patient adamantly refused stating, "No".  Once her daughter came in she was more agreeable after prayer with her daughter.  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  She has been in [place] less than a week.  ? Whether patient would do better with 70/30 regimen so that patient will only need shot twice a day. Discussed with MD and daughter. MD states that the may just do Lantus once a day?  Patient has not gotten any Lantus since coming to the hospital so unclear if this will be enough?  Explained to daughter that glucose goals are less than 250 and to avoid hypoglycemia.  Daughter very knowledgeable but states that Lantus does not seem to be working at home.    If 70/30 considered, may try 15 units 70/30 in the morning and 10 units 70/30 with supper.  Also encourage insulin injections in abdomen instead of arms due to lack of SQ fat in arms.  Will need f/u with PCP here in [place].   Thanks, Beryl MeagerJenny Akshaj Besancon, RN, BC-ADM Inpatient Diabetes Coordinator Pager [phone] (8a-5p)

## 2016-06-27 ENCOUNTER — Ambulatory Visit (INDEPENDENT_AMBULATORY_CARE_PROVIDER_SITE_OTHER): Payer: Medicare (Managed Care) | Admitting: Pulmonary Disease

## 2016-06-27 VITALS — BP 142/72 | HR 71 | Temp 98.1°F | Wt 108.8 lb

## 2016-06-27 DIAGNOSIS — L89152 Pressure ulcer of sacral region, stage 2: Secondary | ICD-10-CM

## 2016-06-27 DIAGNOSIS — N3000 Acute cystitis without hematuria: Secondary | ICD-10-CM

## 2016-06-27 DIAGNOSIS — F0390 Unspecified dementia without behavioral disturbance: Secondary | ICD-10-CM | POA: Diagnosis not present

## 2016-06-27 DIAGNOSIS — L89892 Pressure ulcer of other site, stage 2: Secondary | ICD-10-CM

## 2016-06-27 DIAGNOSIS — Z87440 Personal history of urinary (tract) infections: Secondary | ICD-10-CM

## 2016-06-27 DIAGNOSIS — E1165 Type 2 diabetes mellitus with hyperglycemia: Secondary | ICD-10-CM

## 2016-06-27 DIAGNOSIS — L89612 Pressure ulcer of right heel, stage 2: Secondary | ICD-10-CM | POA: Diagnosis not present

## 2016-06-27 DIAGNOSIS — E118 Type 2 diabetes mellitus with unspecified complications: Secondary | ICD-10-CM

## 2016-06-27 DIAGNOSIS — Z794 Long term (current) use of insulin: Secondary | ICD-10-CM

## 2016-06-27 MED ORDER — INSULIN ASPART PROT & ASPART (70-30 MIX) 100 UNIT/ML ~~LOC~~ SUSP: 18.0000 [IU] | Freq: Two times a day (BID) | SUBCUTANEOUS | 1 refills | Status: DC

## 2016-06-27 NOTE — Patient Instructions (Signed)
Please take Novolog 70/30 insulin 18 units twice a day with meals. Keep heel dressing on for next 5-7 days Follow up in 1 week.

## 2016-06-27 NOTE — Assessment & Plan Note (Signed)
Assessment: Improving overall since she first developed the pressure ulcer. It has healthy granulation tissue. Some eschar noted. Appreciate evaluation by Dorie RankSharon Powers, RN.  Plan:  -Hydrocellular foam heel dressing applied. Keep on for 5-7 days and follow up in 1 week.

## 2016-06-27 NOTE — Assessment & Plan Note (Signed)
Asymptomatic presently. She does not require more treatment.

## 2016-06-27 NOTE — Assessment & Plan Note (Signed)
Assessment: Healthy granulation tissue noted and no infection.   Plan: Continue current home wound care of neosporin and dry dressings changes daily.

## 2016-06-27 NOTE — Progress Notes (Signed)
Wound care provided for right heel wound. Pt's daughter showed pictures of wound healing over past 2+ months. Currently right heel exhibits approx 3 cm wide x 1 1/2 cm long, 1-572mm deep reddened area with yellow hardened matter around all the edges (approx 1-2 mm edge) and gray/white shallow solid cover approx 1 cm in diameter on right lateral/exterior side of wound. Silicone gel adhesive hydrocellular foam heel dressing applied after cleansing area with sterile saline.Pt and dtr advised to leave dressing in place and dry for next 5-7 days to allow dressing to help pt's own healing process to soften the yellow and white/gray escar and allow the healthy pink tissue to heal. Pt and dtr returned same explanation to one another. Dr. Isabella BowensKrall to have pt to return in one week to see if wound healing progressing. Dorie RankSharon Bryan Omura, RN, 06/27/16, 3:41 P

## 2016-06-27 NOTE — Progress Notes (Signed)
   CC: Follow up of UTI and hyperglycemia.  HPI:  [name] is a 82 y.o. woman with COPD, T2DM, HLD dementia presenting for hospital follow up of hyperglycemia. She is accompanied by her daughter who provides most of her history due to her dementia.  She was hospitalized 11/22 to 11/24. She was discharged with Novolog 70/30 15u BID with meals, Macrobid 100mg  BID. Her blood sugars run between 200s and 300s. Lowest is 224.   She did not complete her antibiotic. She took three days worth. Mental status depends on time of day. It has returned to her baseline however. No dysuria or hematuria. No fevers or chills. They are working on changing her insurance.   Ran out of dermagraph. Sacral wound improving. She is concerned about the right heel wound.   Past Medical History:  Diagnosis Date  . Alzheimer's dementia    "dx'd 05/2015; final stages" (06/15/2016)  . Complication of anesthesia    "problems waking up from gallbladder OR in 1978"  . COPD (chronic obstructive pulmonary disease) (HCC)   . High cholesterol   . History of blood transfusion 1996   "related to appendix OR"  . Type II diabetes mellitus (HCC)     Review of Systems:   No chest pain  No dyspnea No abdominal pain  Physical Exam:  Vitals:   06/27/16 1433  BP: (!) 142/72  Pulse: 71  Temp: 98.1 F (36.7 C)  TempSrc: Oral  SpO2: 100%  Weight: 108 lb 12.8 oz (49.4 kg)   General Apperance: NAD HEENT: Normocephalic, atraumatic, anicteric sclera Neck: Supple, trachea midline Lungs: Clear to auscultation bilaterally. No wheezes, rhonchi or rales. Breathing comfortably Heart: Regular rate and rhythm, no murmur/rub/gallop Abdomen: Soft, nontender, nondistended, no rebound/guarding Extremities: Warm and well perfused, no edema Skin: Sacral wound with red granulation tissue and clean edges, 5 x 3cm. No odor. Right heal cm by 2cm with yellow tissue around edges. Some serous drainage. No odor. Neurologic: Alert and  interactive. No gross deficits.   Assessment & Plan:   See Encounters Tab for problem based charting.  Patient discussed with Dr. Criselda PeachesMullen

## 2016-06-27 NOTE — Assessment & Plan Note (Signed)
Assessment: CBG at home in the 200s-300s with some in the 400s. No hypoglycemic episodes.   Plan:  -Increase Novolog 70/30 to 18 units twice a day with meals.

## 2016-07-04 ENCOUNTER — Telehealth: Payer: Self-pay | Admitting: Internal Medicine

## 2016-07-04 NOTE — Telephone Encounter (Signed)
APT. REMINDER CALL, LMTCB °

## 2016-07-05 ENCOUNTER — Inpatient Hospital Stay (HOSPITAL_COMMUNITY)
Admission: EM | Admit: 2016-07-05 | Discharge: 2016-07-09 | DRG: 470 | Disposition: A | Discharge status: Home Health | Payer: Medicare (Managed Care) | Attending: Internal Medicine | Admitting: Internal Medicine

## 2016-07-05 ENCOUNTER — Encounter (HOSPITAL_COMMUNITY): Payer: Self-pay | Admitting: Emergency Medicine

## 2016-07-05 ENCOUNTER — Emergency Department (HOSPITAL_COMMUNITY): Payer: Medicare (Managed Care)

## 2016-07-05 ENCOUNTER — Encounter: Payer: Self-pay | Admitting: Dietician

## 2016-07-05 ENCOUNTER — Ambulatory Visit (INDEPENDENT_AMBULATORY_CARE_PROVIDER_SITE_OTHER): Payer: Medicare (Managed Care) | Admitting: Dietician

## 2016-07-05 ENCOUNTER — Ambulatory Visit (INDEPENDENT_AMBULATORY_CARE_PROVIDER_SITE_OTHER): Payer: Medicare (Managed Care) | Admitting: Pulmonary Disease

## 2016-07-05 VITALS — BP 139/50 | HR 63 | Temp 98.0°F | Ht 61.0 in | Wt 107.6 lb

## 2016-07-05 DIAGNOSIS — R296 Repeated falls: Secondary | ICD-10-CM | POA: Diagnosis not present

## 2016-07-05 DIAGNOSIS — E118 Type 2 diabetes mellitus with unspecified complications: Secondary | ICD-10-CM

## 2016-07-05 DIAGNOSIS — Z91013 Allergy to seafood: Secondary | ICD-10-CM | POA: Diagnosis not present

## 2016-07-05 DIAGNOSIS — Z713 Dietary counseling and surveillance: Secondary | ICD-10-CM | POA: Diagnosis not present

## 2016-07-05 DIAGNOSIS — Z23 Encounter for immunization: Secondary | ICD-10-CM | POA: Diagnosis not present

## 2016-07-05 DIAGNOSIS — L89612 Pressure ulcer of right heel, stage 2: Secondary | ICD-10-CM

## 2016-07-05 DIAGNOSIS — Z880 Allergy status to penicillin: Secondary | ICD-10-CM

## 2016-07-05 DIAGNOSIS — Z9842 Cataract extraction status, left eye: Secondary | ICD-10-CM

## 2016-07-05 DIAGNOSIS — F0280 Dementia in other diseases classified elsewhere without behavioral disturbance: Secondary | ICD-10-CM | POA: Diagnosis present

## 2016-07-05 DIAGNOSIS — Z888 Allergy status to other drugs, medicaments and biological substances status: Secondary | ICD-10-CM | POA: Diagnosis not present

## 2016-07-05 DIAGNOSIS — L89891 Pressure ulcer of other site, stage 1: Secondary | ICD-10-CM | POA: Diagnosis present

## 2016-07-05 DIAGNOSIS — E7800 Pure hypercholesterolemia, unspecified: Secondary | ICD-10-CM | POA: Diagnosis present

## 2016-07-05 DIAGNOSIS — Z961 Presence of intraocular lens: Secondary | ICD-10-CM | POA: Diagnosis present

## 2016-07-05 DIAGNOSIS — Z9841 Cataract extraction status, right eye: Secondary | ICD-10-CM

## 2016-07-05 DIAGNOSIS — Z09 Encounter for follow-up examination after completed treatment for conditions other than malignant neoplasm: Secondary | ICD-10-CM

## 2016-07-05 DIAGNOSIS — J449 Chronic obstructive pulmonary disease, unspecified: Secondary | ICD-10-CM | POA: Diagnosis present

## 2016-07-05 DIAGNOSIS — M80051D Age-related osteoporosis with current pathological fracture, right femur, subsequent encounter for fracture with routine healing: Secondary | ICD-10-CM | POA: Diagnosis not present

## 2016-07-05 DIAGNOSIS — G309 Alzheimer's disease, unspecified: Secondary | ICD-10-CM | POA: Diagnosis present

## 2016-07-05 DIAGNOSIS — L89159 Pressure ulcer of sacral region, unspecified stage: Secondary | ICD-10-CM | POA: Diagnosis not present

## 2016-07-05 DIAGNOSIS — E11621 Type 2 diabetes mellitus with foot ulcer: Secondary | ICD-10-CM | POA: Diagnosis present

## 2016-07-05 DIAGNOSIS — S72001A Fracture of unspecified part of neck of right femur, initial encounter for closed fracture: Secondary | ICD-10-CM

## 2016-07-05 DIAGNOSIS — Z794 Long term (current) use of insulin: Secondary | ICD-10-CM

## 2016-07-05 DIAGNOSIS — F0390 Unspecified dementia without behavioral disturbance: Secondary | ICD-10-CM

## 2016-07-05 DIAGNOSIS — S72001D Fracture of unspecified part of neck of right femur, subsequent encounter for closed fracture with routine healing: Secondary | ICD-10-CM | POA: Diagnosis not present

## 2016-07-05 DIAGNOSIS — M80851A Other osteoporosis with current pathological fracture, right femur, initial encounter for fracture: Principal | ICD-10-CM | POA: Diagnosis present

## 2016-07-05 DIAGNOSIS — E119 Type 2 diabetes mellitus without complications: Secondary | ICD-10-CM

## 2016-07-05 DIAGNOSIS — Z419 Encounter for procedure for purposes other than remedying health state, unspecified: Secondary | ICD-10-CM

## 2016-07-05 DIAGNOSIS — M25551 Pain in right hip: Secondary | ICD-10-CM | POA: Diagnosis present

## 2016-07-05 DIAGNOSIS — E1165 Type 2 diabetes mellitus with hyperglycemia: Secondary | ICD-10-CM | POA: Diagnosis present

## 2016-07-05 DIAGNOSIS — L97519 Non-pressure chronic ulcer of other part of right foot with unspecified severity: Secondary | ICD-10-CM | POA: Diagnosis present

## 2016-07-05 DIAGNOSIS — Z885 Allergy status to narcotic agent status: Secondary | ICD-10-CM | POA: Diagnosis not present

## 2016-07-05 DIAGNOSIS — Y92009 Unspecified place in unspecified non-institutional (private) residence as the place of occurrence of the external cause: Secondary | ICD-10-CM | POA: Diagnosis not present

## 2016-07-05 DIAGNOSIS — L89152 Pressure ulcer of sacral region, stage 2: Secondary | ICD-10-CM | POA: Diagnosis present

## 2016-07-05 DIAGNOSIS — W08XXXA Fall from other furniture, initial encounter: Secondary | ICD-10-CM | POA: Diagnosis present

## 2016-07-05 DIAGNOSIS — L89892 Pressure ulcer of other site, stage 2: Secondary | ICD-10-CM

## 2016-07-05 DIAGNOSIS — X58XXXD Exposure to other specified factors, subsequent encounter: Secondary | ICD-10-CM | POA: Diagnosis not present

## 2016-07-05 LAB — CBC WITH DIFFERENTIAL/PLATELET
BASOS ABS: 0 10*3/uL (ref 0.0–0.1)
BASOS PCT: 0 %
Eosinophils Absolute: 0.1 10*3/uL (ref 0.0–0.7)
Eosinophils Relative: 1 %
HEMATOCRIT: 42.7 % (ref 36.0–46.0)
HEMOGLOBIN: 14.5 g/dL (ref 12.0–15.0)
LYMPHS PCT: 16 %
Lymphs Abs: 1.3 10*3/uL (ref 0.7–4.0)
MCH: 30.6 pg (ref 26.0–34.0)
MCHC: 34 g/dL (ref 30.0–36.0)
MCV: 90.1 fL (ref 78.0–100.0)
MONO ABS: 0.7 10*3/uL (ref 0.1–1.0)
MONOS PCT: 9 %
NEUTROS ABS: 6.1 10*3/uL (ref 1.7–7.7)
NEUTROS PCT: 74 %
Platelets: 173 10*3/uL (ref 150–400)
RBC: 4.74 MIL/uL (ref 3.87–5.11)
RDW: 13.3 % (ref 11.5–15.5)
WBC: 8.2 10*3/uL (ref 4.0–10.5)

## 2016-07-05 LAB — BASIC METABOLIC PANEL
ANION GAP: 8 (ref 5–15)
BUN: 30 mg/dL — ABNORMAL HIGH (ref 6–20)
CALCIUM: 9.7 mg/dL (ref 8.9–10.3)
CO2: 26 mmol/L (ref 22–32)
Chloride: 101 mmol/L (ref 101–111)
Creatinine, Ser: 0.8 mg/dL (ref 0.44–1.00)
GFR calc non Af Amer: >60 mL/min (ref >60)
Glucose, Bld: 405 mg/dL — ABNORMAL HIGH (ref 65–99)
POTASSIUM: 4.3 mmol/L (ref 3.5–5.1)
Sodium: 135 mmol/L (ref 135–145)

## 2016-07-05 LAB — PROTIME-INR
INR: 0.94
Prothrombin Time: 12.5 seconds (ref 11.4–15.2)

## 2016-07-05 LAB — HEMOGLOBIN A1C
Hgb A1c MFr Bld: 10.5 % — ABNORMAL HIGH (ref 4.8–5.6)
MEAN PLASMA GLUCOSE: 255 mg/dL

## 2016-07-05 LAB — ABO/RH: ABO/RH(D): B POS

## 2016-07-05 LAB — TYPE AND SCREEN
ABO/RH(D): B POS
Antibody Screen: NEGATIVE

## 2016-07-05 LAB — CBG MONITORING, ED: Glucose-Capillary: 371 mg/dL — ABNORMAL HIGH (ref 65–99)

## 2016-07-05 MED ORDER — POLYETHYLENE GLYCOL 3350 17 G PO PACK
17.0000 g | PACK | Freq: Every day | ORAL | Status: DC
  Administered 2016-07-06 – 2016-07-09 (×3): 17 g via ORAL
  Filled 2016-07-05 (×3): qty 1

## 2016-07-05 MED ORDER — SODIUM CHLORIDE 0.9 % IV SOLN
INTRAVENOUS | Status: AC
  Administered 2016-07-06: 02:00:00 via INTRAVENOUS

## 2016-07-05 MED ORDER — INSULIN ASPART 100 UNIT/ML ~~LOC~~ SOLN
10.0000 [IU] | Freq: Once | SUBCUTANEOUS | Status: DC
  Filled 2016-07-05: qty 1

## 2016-07-05 MED ORDER — ONDANSETRON HCL 4 MG/2ML IJ SOLN
4.0000 mg | Freq: Once | INTRAMUSCULAR | Status: AC
  Administered 2016-07-05: 4 mg via INTRAVENOUS
  Filled 2016-07-05: qty 2

## 2016-07-05 MED ORDER — ENOXAPARIN SODIUM 40 MG/0.4ML ~~LOC~~ SOLN
40.0000 mg | Freq: Every day | SUBCUTANEOUS | Status: DC
  Administered 2016-07-06: 40 mg via SUBCUTANEOUS
  Filled 2016-07-05: qty 0.4

## 2016-07-05 MED ORDER — INSULIN ASPART 100 UNIT/ML ~~LOC~~ SOLN: 0.0000 [IU] | Freq: Three times a day (TID) | SUBCUTANEOUS | Status: DC

## 2016-07-05 MED ORDER — TRAMADOL-ACETAMINOPHEN 37.5-325 MG PO TABS
1.0000 | ORAL_TABLET | Freq: Four times a day (QID) | ORAL | Status: DC | PRN
  Administered 2016-07-06: 1 via ORAL
  Filled 2016-07-05 (×2): qty 1

## 2016-07-05 MED ORDER — ACCU-CHEK GUIDE W/DEVICE KIT: 1.0000 | PACK | Freq: Two times a day (BID) | 0 refills | Status: DC

## 2016-07-05 MED ORDER — FENTANYL CITRATE (PF) 100 MCG/2ML IJ SOLN
50.0000 ug | INTRAMUSCULAR | Status: DC | PRN
  Administered 2016-07-05: 50 ug via INTRAVENOUS
  Filled 2016-07-05: qty 2

## 2016-07-05 MED ORDER — ACCU-CHEK FASTCLIX LANCETS MISC: 10 refills | Status: DC

## 2016-07-05 MED ORDER — INSULIN ASPART 100 UNIT/ML ~~LOC~~ SOLN
0.0000 [IU] | Freq: Every day | SUBCUTANEOUS | Status: DC
  Administered 2016-07-05: 5 [IU] via SUBCUTANEOUS

## 2016-07-05 MED ORDER — INSULIN ASPART PROT & ASPART (70-30 MIX) 100 UNIT/ML ~~LOC~~ SUSP
12.0000 [IU] | Freq: Two times a day (BID) | SUBCUTANEOUS | Status: DC
  Administered 2016-07-06: 12 [IU] via SUBCUTANEOUS
  Filled 2016-07-05: qty 10

## 2016-07-05 MED ORDER — GLUCOSE BLOOD VI STRP: ORAL_STRIP | 10 refills | Status: DC

## 2016-07-05 NOTE — ED Notes (Signed)
CBG- 371 

## 2016-07-05 NOTE — Patient Instructions (Signed)
We will refer you to the wound care clinic Keep cleaning the wound with water or saline and cover with dry dressing Follow up in 3-4 weeks

## 2016-07-05 NOTE — ED Triage Notes (Signed)
Pt presents from home with [facility] for fall from recliner tonight and resulting RIGHT hip pain; pt has hx of alzheimers/dementia, family at bedside better historians; EMS reports no deformities, no crepitus; EMS also reports CBG 479
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
## 2016-07-05 NOTE — ED Notes (Signed)
Gavin PoundDeborah [phone]

## 2016-07-05 NOTE — ED Notes (Signed)
MD at bedside. 

## 2016-07-05 NOTE — Progress Notes (Signed)
  Medical Nutrition Therapy:  Appt start time: 1330 end time:  1430. Visit # 1  Assessment:  Primary concerns today: wound healing and diabetes control/meter.  [name] is from [place] and accompanied today by her husband and daughter. She has dementia and two wounds that are slow at healing and diabetes. She has diabetes treated with insulin 18 units two times a day before breakfast and dinner.  The daughter was paying cash for her supplies not realizing that medicare would pay for it with a prescription. [name] has a very good appetite, poor dentition but can shew and swallow softer foods which they are providing for her.  Preferred Learning Style: No preference indicated  Learning Readiness: Ready and Change in progress  ANTHROPOMETRICS: weight-107#, height-61", BMI-20 WEIGHT HISTORY: Highest: 130 Lowest-10 SLEEP:need to assess Denies Food Intolerances,Nausea,Vomiting, Diarrhea:Constipation: or Any hair loss:  Dining Out (times/week):  MEDICATIONS: daughter gives insulin twice daily BLOOD SUGAR:A1C 10.5, would guess goal is ~7.5- 8 DIETARY INTAKE: Usual eating pattern includes 3 meals and 2 snacks per day. Everyday foods include eggs, oatmeal, nuts, banana, coffee, milk, cottage cheese, fruit, juice.   24-hr recall:  B ( AM): 2 eggs, 2 slice toast, activia yogurt with 2 teaspoons nuts Snk ( AM): fruit, yogurt Lunch- peanut butter sandwich, diet v-8 splash Dinner- cauliflower mashed potatoes Snk ( PM): ice cream, yogurt,  Beverages: water, milk, juice, coffee  Usual physical activity: ADLs  Estimated energy needs: 1200-140 calories 150-160 g carbohydrates 70-75 g protein for wound healing  Progress Towards Goal(s):  In progress.   Nutritional Diagnosis:  NB-1.1 Food and nutrition-related knowledge deficit As related to lack of suffient diabetes meal planning inpast.  As evidenced by their report and questions.    Intervention:  Nutrition education about  carbohydrates, nutrient needs, how to use new meter and Medicare coverage opf diabetes supploies. Coordination of care:meter supplies ordered today   Teaching Method Utilized: Visual, Auditory,Hands on Handouts given during visit include:AVS, carb counting Barriers to learning/adherence to lifestyle change: dementia Demonstrated degree of understanding via:  Teach Back   Monitoring/Evaluation:  Dietary intake, exercise, meter, and body weight in 4 week(s). Plyler, Sharon Butler, RD 07/07/2016 3:19 PM.

## 2016-07-05 NOTE — Progress Notes (Signed)
Internal Medicine Clinic Attending  I saw and evaluated the patient.  I personally confirmed the key portions of the history and exam documented by Dr. Krall and I reviewed pertinent patient test results.  The assessment, diagnosis, and plan were formulated together and I agree with the documentation in the resident's note.  

## 2016-07-05 NOTE — ED Provider Notes (Addendum)
[facility]-EMERGENCY DEPT Provider Note   CSN: [id number] Arrival date & time: 07/05/16  2011     History   Chief Complaint Chief Complaint  Patient presents with  . Fall  . Hip Pain    HPI Sharon Butler is a 82 y.o. female.  Pt presents to the ED today with right hip pain.  The pt was admitted from 11/22-24 to the IM resident teaching service because of poorly controlled DM and nonhealing wounds.  She had a follow up appt today in the clinic and had been doing well.  This evening, she fell off the couch and landed on her right hip.  The pt was unable to stand after she fell.      Past Medical History:  Diagnosis Date  . Alzheimer's dementia    "dx'd 05/2015; final stages" (06/15/2016)  . Complication of anesthesia    "problems waking up from gallbladder OR in 1978"  . COPD (chronic obstructive pulmonary disease) (HCC)   . High cholesterol   . History of blood transfusion 1996   "related to appendix OR"  . Type II diabetes mellitus (HCC) 1998    Patient Active Problem List   Diagnosis Date Noted  . Femoral neck fracture (HCC) 07/05/2016  . Decubitus ulcer of right foot, stage 2   . Decubitus ulcer of sacral region, stage 2   . Diabetes mellitus (HCC) 06/15/2016    Past Surgical History:  Procedure Laterality Date  . APPENDECTOMY  1986  . CATARACT EXTRACTION W/ INTRAOCULAR LENS  IMPLANT, BILATERAL Bilateral   . CHOLECYSTECTOMY  1978  . DILATION AND CURETTAGE OF UTERUS      OB History    No data available       Home Medications    Prior to Admission medications   Medication Sig Start Date End Date Taking? Authorizing Provider  Ascorbic Acid (VITA-C PO) Take 1 tablet by mouth daily as needed (for immune).   Yes Historical Provider, MD  CINNAMON PO Take 2 tablets by mouth 2 (two) times daily.    Yes Historical Provider, MD  feeding supplement, GLUCERNA SHAKE, (GLUCERNA SHAKE) LIQD Take 237 mLs by mouth 2 (two) times daily between meals. 06/17/16  Yes Nyra MarketGorica  Svalina, MD  ibuprofen (ADVIL,MOTRIN) 200 MG tablet Take 200-400 mg by mouth every 6 (six) hours as needed for headache or moderate pain.   Yes Historical Provider, MD  insulin aspart protamine- aspart (NOVOLOG MIX 70/30) (70-30) 100 UNIT/ML injection Inject 0.18 mLs (18 Units total) into the skin 2 (two) times daily with a meal. 06/27/16  Yes Lora PaulaJennifer T Krall, MD  nitrofurantoin, macrocrystal-monohydrate, (MACROBID) 100 MG capsule Take 100 mg by mouth 2 (two) times daily.   Yes Historical Provider, MD  Omega 3-6-9 Fatty Acids (OMEGA 3-6-9 COMPLEX) CAPS Take 1 capsule by mouth daily.   Yes Historical Provider, MD  simvastatin (ZOCOR) 10 MG tablet Take 10 mg by mouth daily.   Yes Historical Provider, MD    Family History History reviewed. No pertinent family history.  Social History Social History  Substance Use Topics  . Smoking status: Never Smoker  . Smokeless tobacco: Never Used  . Alcohol use No     Allergies   Codeine; Other; Penicillins; and Shellfish allergy   Review of Systems Review of Systems  Musculoskeletal:       Right hip tenderness  All other systems reviewed and are negative.    Physical Exam Updated Vital Signs BP 193/69 (BP Location: Right Arm)  Pulse 63   Temp 98.1 F (36.7 C) (Oral)   Resp 22   SpO2 100%   Physical Exam  Constitutional: She is oriented to person, place, and time. She appears well-developed and well-nourished.  HENT:  Head: Normocephalic and atraumatic.  Right Ear: External ear normal.  Left Ear: External ear normal.  Nose: Nose normal.  Mouth/Throat: Oropharynx is clear and moist.  Eyes: Conjunctivae and EOM are normal. Pupils are equal, round, and reactive to light.  Neck: Normal range of motion. Neck supple.  Cardiovascular: Normal rate, regular rhythm, normal heart sounds and intact distal pulses.   Pulmonary/Chest: Effort normal and breath sounds normal.  Abdominal: Soft. Bowel sounds are normal.  Musculoskeletal:        Right hip: She exhibits tenderness.  Neurological: She is alert and oriented to person, place, and time.  Skin: Skin is warm and dry.  Psychiatric: She has a normal mood and affect. Her behavior is normal. Judgment and thought content normal.  Nursing note and vitals reviewed.    ED Treatments / Results  Labs (all labs ordered are listed, but only abnormal results are displayed) Labs Reviewed  BASIC METABOLIC PANEL - Abnormal; Notable for the following:       Result Value   Glucose, Bld 405 (*)    BUN 30 (*)    All other components within normal limits  CBG MONITORING, ED - Abnormal; Notable for the following:    Glucose-Capillary 371 (*)    All other components within normal limits  CBC WITH DIFFERENTIAL/PLATELET  PROTIME-INR  URINALYSIS, ROUTINE W REFLEX MICROSCOPIC  TYPE AND SCREEN    EKG  EKG Interpretation None       Radiology Dg Chest 1 View  Result Date: 07/05/2016 CLINICAL DATA:  Status post fall EXAM: CHEST 1 VIEW COMPARISON:  None. FINDINGS: There is atherosclerotic calcification within the aortic arch. Cardiomediastinal contours are otherwise normal. There is no focal airspace consolidation or pulmonary edema. No pneumothorax or sizable pleural effusion. IMPRESSION: No active disease.  Aortic atherosclerosis. Electronically Signed   By: Deatra Robinson M.D.   On: 07/05/2016 21:20   Dg Knee Complete 4 Views Right  Result Date: 07/05/2016 CLINICAL DATA:  Right knee pain after fall. EXAM: RIGHT KNEE - COMPLETE 4+ VIEW COMPARISON:  None. FINDINGS: No evidence of fracture, dislocation, or joint effusion. Mild narrowing of medial joint space is noted. Vascular calcifications are noted. IMPRESSION: Mild degenerative joint disease is noted medially. No acute abnormality seen in the right knee. Electronically Signed   By: Lupita Raider, M.D.   On: 07/05/2016 21:21   Dg Hip Unilat With Pelvis 2-3 Views Right  Result Date: 07/05/2016 CLINICAL DATA:  Fall. History of  Alzheimer's disease/dementia. RIGHT hip deformity. Hyperglycemia. EXAM: DG HIP (WITH OR WITHOUT PELVIS) 2-3V RIGHT COMPARISON:  None. FINDINGS: Acute RIGHT femoral neck fracture with impaction, fracture fragments in alignment. Femoral head is located. Osteopenia without destructive bony lesions. Severe vascular calcifications. IMPRESSION: Acute RIGHT femoral neck fracture without dislocation. Electronically Signed   By: Awilda Metro M.D.   On: 07/05/2016 21:20    Procedures Procedures (including critical care time)  Medications Ordered in ED Medications  fentaNYL (SUBLIMAZE) injection 50 mcg (50 mcg Intravenous Given 07/05/16 2040)  ondansetron (ZOFRAN) injection 4 mg (4 mg Intravenous Given 07/05/16 2040)     Initial Impression / Assessment and Plan / ED Course  I have reviewed the triage vital signs and the nursing notes.  Pertinent labs & imaging  results that were available during my care of the patient were reviewed by me and considered in my medical decision making (see chart for details).  Clinical Course    Pt d/w Dr. Linna CapriceSwinteck (ortho).  He will fix her hip when her sugars are consistently below 200.  Pt will be given 10 units insulin here.  Pt d/w IM resident who will admit patient to the hospital.  Final Clinical Impressions(s) / ED Diagnoses   Final diagnoses:  Closed fracture of right hip, initial encounter [facility](HCC)  Poorly controlled type 2 diabetes mellitus [facility](HCC)    New Prescriptions New Prescriptions   No medications on file     Jacalyn LefevreJulie Moyses Pavey, MD 07/05/16 2145    Jacalyn LefevreJulie Nyxon Strupp, MD 07/05/16 2233

## 2016-07-05 NOTE — Patient Instructions (Addendum)
[name] needs about 70-75 grams of protein each day for wound healing.   It will help stabilize her blood sugar if she eats the same amount of carbohydrate at meals each day She needs about 155-175 grams of carbohydrate each day. Carbohydrate = carb             Protein in grams Breakfast and dinner- suggest about 45-60 grams carb     10-20 Snack-10-20 grams carb         5-8  Lunch 35-50 grams carb         20-25 Snack 10-20 grams carb         5-8 Dinner 45-60 grams carb         20-25 Bedtime snack 15-25 grams carb        5-10  It would be good to get a multivitamin in once a day- consider Childrens chewables check the label but I think she will need two a day.   Please make a follow up in 3-4 weeks

## 2016-07-05 NOTE — ED Notes (Signed)
Patient transported to X-ray via Doctor, general practicestretcher with tranporter

## 2016-07-05 NOTE — Progress Notes (Signed)
   CC: Right heel wound  HPI:  Sharon Butler is a 82 y.o. woman with COPD, T2DM, HLD dementia presenting for hospital follow up of hyperglycemia. She is accompanied by her daughter who provides most of her history due to her dementia.  The heel wound is slightly improved. She was unable to keep the Allevyn dressing on more than a few hours.  The sacral wound is improved.   Past Medical History:  Diagnosis Date  . Alzheimer's dementia    "dx'd 05/2015; final stages" (06/15/2016)  . Complication of anesthesia    "problems waking up from gallbladder OR in 1978"  . COPD (chronic obstructive pulmonary disease) (HCC)   . High cholesterol   . History of blood transfusion 1996   "related to appendix OR"  . Type II diabetes mellitus (HCC)     Review of Systems:   No fevers or chills No dysuria  Physical Exam:  Vitals:   07/05/16 1349  BP: (!) 139/50  Pulse: 63  Temp: 98 F (36.7 C)  TempSrc: Oral  Weight: 107 lb 9.6 oz (48.8 kg)   General Apperance: NAD HEENT: Normocephalic, atraumatic, anicteric sclera Neck: Supple, trachea midline Lungs: Clear to auscultation bilaterally. No wheezes, rhonchi or rales. Breathing comfortably Heart: Regular rate and rhythm, no murmur/rub/gallop Abdomen: Soft, nontender, nondistended, no rebound/guarding Extremities: Warm and well perfused, no edema Skin: Sacral wound with red granulation tissue and clean edges, 3 x 2cm. No odor. Right heal 3cm by 2cm with yellow tissue around edges. Some serous drainage. No odor. Neurologic: Alert and interactive. No gross deficits.  Assessment & Plan:   See Encounters Tab for problem based charting.  Patient discussed with Dr. Oswaldo DoneVincent

## 2016-07-06 DIAGNOSIS — Y92009 Unspecified place in unspecified non-institutional (private) residence as the place of occurrence of the external cause: Secondary | ICD-10-CM

## 2016-07-06 DIAGNOSIS — L89613 Pressure ulcer of right heel, stage 3: Secondary | ICD-10-CM

## 2016-07-06 DIAGNOSIS — E11622 Type 2 diabetes mellitus with other skin ulcer: Secondary | ICD-10-CM

## 2016-07-06 DIAGNOSIS — R296 Repeated falls: Secondary | ICD-10-CM

## 2016-07-06 DIAGNOSIS — E1165 Type 2 diabetes mellitus with hyperglycemia: Secondary | ICD-10-CM

## 2016-07-06 DIAGNOSIS — Z9181 History of falling: Secondary | ICD-10-CM

## 2016-07-06 DIAGNOSIS — W08XXXA Fall from other furniture, initial encounter: Secondary | ICD-10-CM

## 2016-07-06 DIAGNOSIS — Z833 Family history of diabetes mellitus: Secondary | ICD-10-CM

## 2016-07-06 DIAGNOSIS — S72001A Fracture of unspecified part of neck of right femur, initial encounter for closed fracture: Secondary | ICD-10-CM

## 2016-07-06 DIAGNOSIS — M810 Age-related osteoporosis without current pathological fracture: Secondary | ICD-10-CM

## 2016-07-06 DIAGNOSIS — Z9109 Other allergy status, other than to drugs and biological substances: Secondary | ICD-10-CM

## 2016-07-06 DIAGNOSIS — R32 Unspecified urinary incontinence: Secondary | ICD-10-CM

## 2016-07-06 DIAGNOSIS — L89159 Pressure ulcer of sacral region, unspecified stage: Secondary | ICD-10-CM

## 2016-07-06 LAB — GLUCOSE, CAPILLARY
GLUCOSE-CAPILLARY: 253 mg/dL — AB (ref 65–99)
GLUCOSE-CAPILLARY: 283 mg/dL — AB (ref 65–99)
GLUCOSE-CAPILLARY: 178 mg/dL — AB (ref 65–99)

## 2016-07-06 MED ORDER — INSULIN ASPART 100 UNIT/ML ~~LOC~~ SOLN
0.0000 [IU] | Freq: Three times a day (TID) | SUBCUTANEOUS | Status: DC
  Administered 2016-07-06: 5 [IU] via SUBCUTANEOUS
  Administered 2016-07-07 (×2): 3 [IU] via SUBCUTANEOUS
  Administered 2016-07-07 – 2016-07-08 (×2): 2 [IU] via SUBCUTANEOUS
  Administered 2016-07-08: 9 [IU] via SUBCUTANEOUS
  Administered 2016-07-08: 3 [IU] via SUBCUTANEOUS
  Administered 2016-07-09 (×2): 7 [IU] via SUBCUTANEOUS
  Administered 2016-07-09: 9 [IU] via SUBCUTANEOUS

## 2016-07-06 MED ORDER — MUPIROCIN CALCIUM 2 % EX CREA
TOPICAL_CREAM | Freq: Every day | CUTANEOUS | Status: DC
  Administered 2016-07-06 – 2016-07-09 (×4): via TOPICAL
  Filled 2016-07-06: qty 15

## 2016-07-06 MED ORDER — INSULIN ASPART PROT & ASPART (70-30 MIX) 100 UNIT/ML ~~LOC~~ SUSP
18.0000 [IU] | Freq: Two times a day (BID) | SUBCUTANEOUS | Status: DC
  Administered 2016-07-06 – 2016-07-09 (×8): 18 [IU] via SUBCUTANEOUS
  Filled 2016-07-06 (×2): qty 10

## 2016-07-06 MED ORDER — GLUCERNA SHAKE PO LIQD
237.0000 mL | Freq: Two times a day (BID) | ORAL | Status: DC
Start: 2016-07-06 — End: 2016-07-09
  Administered 2016-07-06 – 2016-07-09 (×4): 237 mL via ORAL

## 2016-07-06 NOTE — Consult Note (Addendum)
WOC Nurse wound consult note Reason for Consult: Consult requested for sacrum and right heel.  [facility], wounds have improved in appearance and decreased in size since previous assessment. Pt is very emaciated and has multiple systemic factors which can impair healing. Wound type: Sacrum- 2 areas with stage 3 pressure injuries;1X.5X.2cm and 3X.4X.2cm;  100% red and moist,  small amt yellow drainage, no odor Right heel with previous unstageable pressure injury which has evolved into stage 3 pressure injury at this time; 3X2cm, 85% red, 15% yellow slough, dry red wound bed, no odor. Pressure Ulcer POA: Yes Dressing procedure/placement/frequency: Float heels to reduce pressure and position off sacrum as often as possible.  Foam dressing to protect and promote healing to sacrum wound.  Bactroban to promote moist healing to right heel, protect from further injury with foam dressing. Daughter is not present to discuss plan of care. Please re-consult if further assistance is needed. Thank-you,  Cammie Mcgeeawn Elhadji Pecore MSN, RN, CWOCN, ChenowethWCN-AP, CNS [phone]

## 2016-07-06 NOTE — Plan of Care (Signed)
Problem: Education: Goal: Knowledge of [facility] Education information/materials will improve Outcome: Progressing POC reviewed with pt. and husband; unsure how much pt. understands.

## 2016-07-06 NOTE — Progress Notes (Signed)
Brought pt's husband copy of New Testament/Psalms, which he appreciated. Chaplain available for f/u.   07/06/16 1300  Clinical Encounter Type  Visited With Patient and family together  Visit Type Follow-up  Referral From Chaplain  Spiritual Encounters  Spiritual Needs Sacred text   Ephraim Hamburgerynthia A Rondi Ivy, Chaplain

## 2016-07-06 NOTE — Progress Notes (Addendum)
Initial Nutrition Assessment  DOCUMENTATION CODES:   Not applicable  INTERVENTION:  Downgrade diet to dysphagia 3 diet due to no dentition.  Provide Glucerna Shake po BID, each supplement provides 220 kcal and 10 grams of protein.  Encourage adequate PO intake.   NUTRITION DIAGNOSIS:   Increased nutrient needs related to wound healing as evidenced by estimated needs.  GOAL:   Patient will meet greater than or equal to 90% of their needs  MONITOR:   PO intake, Supplement acceptance, Labs, Weight trends, Skin, I & O's  REASON FOR ASSESSMENT:   Malnutrition Screening Tool, Low Braden    ASSESSMENT:   82 y.o. female with a PMH of uncontrolled type 2 DM wiith nonhealing wounds and alzheimer's dementia who presents with hip fracture.  Pt was asleep during time of visit and did not wake up to RD assessment. No family at bedside. Noted pt with dementia. Nurse tech at bedside reports pt is poor historian. RD unable to obtain most recent nutrition history. Meal completion 95-100%. Nurse tech reports pt has been having trouble chewing food at meals as pt with no teeth as family had reported to nurse tech dentures do not fit anymore as pt with weight loss. Noted pt with wounds. RD to order nutritional supplements to aid in caloric and protein needs as well as in wound healing.   Per MD note, possible plans for surgery tomorrow depending on blood sugar status.   Limited nutrition-focused physical exam completed. Findings are no fat depletion, moderate muscle depletion, and mild edema.   Labs and medications reviewed. CBG 208-362 mg/dL.  Diet Order:  Diet NPO time specified DIET DYS 3 Room service appropriate? Yes; Fluid consistency: Thin  Skin:  Wound (see comment) (Stage III on R heel, sacrum)  Last BM:  Unknown  Height:   Ht Readings from Last 1 Encounters:  07/05/16 5\' 1"  (1.549 m)    Weight:   Wt Readings from Last 1 Encounters:  07/05/16 107 lb 9.6 oz (48.8 kg)     Ideal Body Weight:  47.7 kg  BMI:  There is no height or weight on file to calculate BMI.  Estimated Nutritional Needs:   Kcal:  1450-1650  Protein:  65-75 grams  Fluid:  >/= 1.5 L/day  EDUCATION NEEDS:   No education needs identified at this time  Roslyn SmilingStephanie Khloee Garza, MS, RD, LDN Pager # [phone] After hours/ weekend pager # [phone]

## 2016-07-06 NOTE — Consult Note (Signed)
 ORTHOPAEDIC CONSULTATION  REQUESTING PHYSICIAN: Nischal Narendra, MD  PCP:  [name], DO  Chief Complaint: Displaced right femoral neck fracture  HPI: Sharon Butler is a 82 y.o. female who was previously hospitalized about a month ago for severe hyperglycemia, uncontrolled diabetes. She was found to have stage II pressure sores to her sacrum and right heel. She has severe dementia and is dependent on her family for ADLs. She is a household ambulator. She tripped and fell yesterday and injured her right hip. She was admitted by the hospitalist for right femoral neck fracture. Orthopedic consultation was placed for management of her fracture. Of note, in the emergency department her blood sugar was over 400.  Past Medical History:  Diagnosis Date  . Alzheimer's dementia    "dx'd 05/2015; final stages" (06/15/2016)  . Complication of anesthesia    "problems waking up from gallbladder OR in 1978"  . COPD (chronic obstructive pulmonary disease) (HCC)   . High cholesterol   . History of blood transfusion 1996   "related to appendix OR"  . Type II diabetes mellitus (HCC) 1998   Past Surgical History:  Procedure Laterality Date  . APPENDECTOMY  1986  . CATARACT EXTRACTION W/ INTRAOCULAR LENS  IMPLANT, BILATERAL Bilateral   . CHOLECYSTECTOMY  1978  . DILATION AND CURETTAGE OF UTERUS     Social History   Social History  . Marital status: Divorced    Spouse name: N/A  . Number of children: N/A  . Years of education: N/A   Social History Main Topics  . Smoking status: Never Smoker  . Smokeless tobacco: Never Used  . Alcohol use No  . Drug use: No  . Sexual activity: Not Asked   Other Topics Concern  . None   Social History Narrative  . None   History reviewed. No pertinent family history. Allergies  Allergen Reactions  . Codeine Hives and Itching    Hives itching  . Other Other (See Comments)    Anesthesia-trouble waking up  . Penicillins Other (See Comments)   Anaphylactic shock Has patient had a PCN reaction causing immediate rash, facial/tongue/throat swelling, SOB or lightheadedness with hypotension:YES Has patient had a PCN reaction causing severe rash involving mucus membranes or skin necrosis: NO Has patient had a PCN reaction that required hospitalization NO Has patient had a PCN reaction occurring within the last 10 years:NO If all of the above answers are "NO", then may proceed with Cephalosporin use.  . Shellfish Allergy Hives    hives   Prior to Admission medications   Medication Sig Start Date End Date Taking? Authorizing Provider  Ascorbic Acid (VITA-C PO) Take 1 tablet by mouth daily as needed (for immune).   Yes Historical Provider, MD  CINNAMON PO Take 2 tablets by mouth 2 (two) times daily.    Yes Historical Provider, MD  feeding supplement, GLUCERNA SHAKE, (GLUCERNA SHAKE) LIQD Take 237 mLs by mouth 2 (two) times daily between meals. 06/17/16  Yes Gorica Svalina, MD  ibuprofen (ADVIL,MOTRIN) 200 MG tablet Take 200-400 mg by mouth every 6 (six) hours as needed for headache or moderate pain.   Yes Historical Provider, MD  insulin aspart protamine- aspart (NOVOLOG MIX 70/30) (70-30) 100 UNIT/ML injection Inject 0.18 mLs (18 Units total) into the skin 2 (two) times daily with a meal. 06/27/16  Yes Jennifer T Krall, MD  nitrofurantoin, macrocrystal-monohydrate, (MACROBID) 100 MG capsule Take 100 mg by mouth 2 (two) times daily.   Yes Historical Provider, MD    Omega 3-6-9 Fatty Acids (OMEGA 3-6-9 COMPLEX) CAPS Take 1 capsule by mouth daily.   Yes Historical Provider, MD  simvastatin (ZOCOR) 10 MG tablet Take 10 mg by mouth daily.   Yes Historical Provider, MD   Dg Chest 1 View  Result Date: 07/05/2016 CLINICAL DATA:  Status post fall EXAM: CHEST 1 VIEW COMPARISON:  None. FINDINGS: There is atherosclerotic calcification within the aortic arch. Cardiomediastinal contours are otherwise normal. There is no focal airspace consolidation or  pulmonary edema. No pneumothorax or sizable pleural effusion. IMPRESSION: No active disease.  Aortic atherosclerosis. Electronically Signed   By: Deatra RobinsonKevin  Herman M.D.   On: 07/05/2016 21:20   Dg Knee Complete 4 Views Right  Result Date: 07/05/2016 CLINICAL DATA:  Right knee pain after fall. EXAM: RIGHT KNEE - COMPLETE 4+ VIEW COMPARISON:  None. FINDINGS: No evidence of fracture, dislocation, or joint effusion. Mild narrowing of medial joint space is noted. Vascular calcifications are noted. IMPRESSION: Mild degenerative joint disease is noted medially. No acute abnormality seen in the right knee. Electronically Signed   By: Lupita RaiderJames  Green Jr, M.D.   On: 07/05/2016 21:21   Dg Hip Unilat With Pelvis 2-3 Views Right  Result Date: 07/05/2016 CLINICAL DATA:  Fall. History of Alzheimer's disease/dementia. RIGHT hip deformity. Hyperglycemia. EXAM: DG HIP (WITH OR WITHOUT PELVIS) 2-3V RIGHT COMPARISON:  None. FINDINGS: Acute RIGHT femoral neck fracture with impaction, fracture fragments in alignment. Femoral head is located. Osteopenia without destructive bony lesions. Severe vascular calcifications. IMPRESSION: Acute RIGHT femoral neck fracture without dislocation. Electronically Signed   By: Awilda Metroourtnay  Bloomer M.D.   On: 07/05/2016 21:20    Positive ROS: All other systems have been reviewed and were otherwise negative with the exception of those mentioned in the HPI and as above.  Physical Exam: General: Alert, no acute distress Cardiovascular: No pedal edema Respiratory: No cyanosis, no use of accessory musculature GI: No organomegaly, abdomen is soft and non-tender Skin: No lesions in the area of chief complaint Neurologic: Sensation intact distally Psychiatric: Patient is competent for consent with normal mood and affect Lymphatic: No axillary or cervical lymphadenopathy  MUSCULOSKELETAL: Examination of the right hip reveals no skin wounds or lesions the lower extremity shortened and externally  rotated. She does have a pressure sore over the right heel. She also has a pressure sore over the sacrum that is healing well, no overt evidence of infection. She has pain with attempted logrolling of the hip.  Assessment: Displaced right femoral neck fracture Uncontrolled diabetes Pressure sores to right heel and sacrum  Plan: I discussed the finding with the patient and her family. They understand that she is in a difficult situation. The risk of surgery and her would be higher than normal due to her elevated blood sugar and her decubitus ulcers. On the other hand, if we do not fix her hip, she will be bedridden and suffer the complications associated with this. We will plan for right hip hemiarthroplasty once her sugars are consistently below 200. Surgery possibly tomorrow depending on blood sugar status. She may have an oral diet today; nothing by mouth after midnight.    Xavious Sharrar, Cloyde ReamsBrian James, MD Cell [phone]    07/06/2016 7:54 AM

## 2016-07-06 NOTE — Progress Notes (Signed)
Pt. transported from ER via stretcher; staff moved pt. from stretcher to bed - 5N- 19; husband with pt. Pt. speaks little [place] and husband speaks [place] well. Pt. Combative, hollering, and incontinent with wounds on buttocks. Paged Dr. Obie DredgeBlum and informed pt. being combative and incontinent. Pt. pulling at IV and IV wrapped with cling.

## 2016-07-06 NOTE — Progress Notes (Signed)
Responded to consult for advanced directive and prayer. After reviewing physicians' notes as to pt's physical/mental status, was unsure pt would have capacity to complete advanced directive. Consulted w/ nurse, who said pt was confused. After speaking with pr and her husband in rm, I concurred (and I gathered husband did by his facial expression/communication to me after pt said something I didn't understand). Consequently, I did not ask pt if she wished to complete an advanced directive. Spiritual/emotional support was offered, and, after pt seemed to ask for a "plain" prayer and I asked to if she wished to say the Lord's prayer, she assented and jher husband and sitter joined us. I added a simple prayer for healing. The husband was also glad to pray, saying only in the name of Jesus are we saved. Chaplain available for f/u.    07/06/16 1000  Clinical Encounter Type  Visited With Patient and family together;Health care provider  Visit Type Initial;Psychological support;Spiritual support;Social support;Pre-op  Referral From Nurse  Spiritual Encounters  Spiritual Needs Prayer;Emotional  Stress Factors  Patient Stress Factors Health changes;Loss of control;Major life changes  Family Stress Factors Family relationships;Health changes;Loss of control;Major life changes   Sharon Butler, [address]

## 2016-07-06 NOTE — Progress Notes (Signed)
Results for Fabio BeringGOSTO, Christol (MRN [id number]) as of 07/06/2016 12:38  Ref. Range 06/17/2016 04:51 06/17/2016 08:49 06/17/2016 11:56 07/05/2016 20:20 07/06/2016 06:45  Glucose-Capillary Latest Ref Range: 65 - 99 mg/dL 409282 (H) 811208 (H) 914362 (H) 371 (H) 253 (H)   Noted that blood sugars have been greater than 200 mg/dl.  Recommend adding Novolog SENSITIVE correction scale TID & HS along with 70/30 insulin. Will continue to monitor blood sugars while in the hospital. Smith MinceKendra Arnetta Odeh RN BSN CDE

## 2016-07-06 NOTE — Progress Notes (Signed)
   Subjective: Patient was evaluated this morning on rounds. Husband was at the bedside and helped provide history as patient has Alzheimer. Patient was sitting up in bed eating breakfast and had no questions or concerns when asked.  Patient denies pain.  Objective:  Vital signs in last 24 hours: Vitals:   07/05/16 2030 07/05/16 2225 07/05/16 2347 07/06/16 0650  BP: 154/76 140/71 (!) 133/97 (!) 178/76  Pulse: 68 70 (!) 106 76  Resp:    18  Temp:   98.5 F (36.9 C) 98 F (36.7 C)  TempSrc:   Axillary Axillary  SpO2: 90% 96% 100% 100%   Physical Exam  Constitutional:  Frail appearing   Cardiovascular: Normal rate, regular rhythm and normal heart sounds.  Exam reveals no gallop and no friction rub.   No murmur heard. Pulmonary/Chest: Effort normal and breath sounds normal. No respiratory distress. She has no wheezes. She has no rales.  Breath sounds auscultated on anterior chest wall  Abdominal: Soft. Bowel sounds are normal. She exhibits no distension. There is no tenderness.  Musculoskeletal: She exhibits no edema.  No ecchymosis or hematoma noted over the right greater trochanter. Tenderness noted over the right greater trochanter  Skin:  Ulcer on right heel, healing and wrapped    Assessment/Plan:  Principal Problem:   Closed displaced fracture of right femoral neck (HCC) Active Problems:   Diabetes mellitus (HCC)   Decubitus ulcer of right foot, stage 2   Decubitus ulcer of sacral region, stage 2   Dementia  Femoral neck fracture  Her pain is currently controlled, Orthopedic surgery has evaluated the patient and plans for surgery once her blood sugars are controlled and have been consistently <200.  She will be NPO at midnight in anticipation of surgery tomorrow.   -Orthopedic surgery is following -Foley catheter  -Miralax 17 g daily  -Tramadol- acetaminophel 37.5 mg q6h PRN   Alzheimer's dementia  Patient has a history of alzheimer's dementia which her family  feels is at baseline. She is at increased risk for hospital delirium in the setting of her hip fracture and immobilization.    - Continue Delirium precautions  -Ordered sitter at bedside  -Foley catheter in place for incontinence   Type 2 Diabetes  Last HbA1c 05/2016 10.5. Home medication is novolog 70/30 18 U BID.  Yesterday patient received novolog 70/30 12 U and blood sugar was in the 200s.  Patient received home dose of 18U this morning.  Will reassess blood sugar this afternoon to determine any change in insulin regimen. - Re-started on home dose insulin  Osteoporosis  Fragility fracture in this elder lady qualifies as a diagnosis of osteoporosis. She will need evaluation for vitamin D deficiency and need to start bisphosphonate therapy after surgery inpatient.   Chronic nonhealing wounds  She has a history of non healing wounds over her sacrum and right heal since hospitalization in [place] in [place]. Wounds were evaluated in clinic yesterday afternoon and showed no signs of infection.  -Wound care consult placed today    F NS 125 cc/hr  DVT Ppx subq lovenox  Code Status FULL   Dispo: Anticipated discharge pending clinical improvement.   Camelia PhenesJessica Ratliff Cherrill Scrima, DO 07/06/2016, 1:34 PM Pager: [phone]

## 2016-07-07 ENCOUNTER — Encounter (HOSPITAL_COMMUNITY): Payer: Self-pay | Admitting: Certified Registered Nurse Anesthetist

## 2016-07-07 ENCOUNTER — Inpatient Hospital Stay (HOSPITAL_COMMUNITY): Payer: Medicare (Managed Care) | Admitting: Certified Registered Nurse Anesthetist

## 2016-07-07 ENCOUNTER — Other Ambulatory Visit: Payer: Self-pay

## 2016-07-07 ENCOUNTER — Inpatient Hospital Stay: Admit: 2016-07-07 | Payer: Medicare (Managed Care) | Admitting: Orthopedic Surgery

## 2016-07-07 ENCOUNTER — Encounter (HOSPITAL_COMMUNITY)
Admission: EM | Disposition: A | Discharge status: Home Health | Payer: Self-pay | Source: Home / Self Care | Attending: Internal Medicine

## 2016-07-07 ENCOUNTER — Inpatient Hospital Stay (HOSPITAL_COMMUNITY): Payer: Medicare (Managed Care)

## 2016-07-07 DIAGNOSIS — S72001A Fracture of unspecified part of neck of right femur, initial encounter for closed fracture: Secondary | ICD-10-CM | POA: Diagnosis present

## 2016-07-07 HISTORY — PX: ANTERIOR APPROACH HEMI HIP ARTHROPLASTY: SHX6690

## 2016-07-07 LAB — GLUCOSE, CAPILLARY
GLUCOSE-CAPILLARY: 242 mg/dL — AB (ref 65–99)
GLUCOSE-CAPILLARY: 170 mg/dL — AB (ref 65–99)
GLUCOSE-CAPILLARY: 143 mg/dL — AB (ref 65–99)
Glucose-Capillary: 151 mg/dL — ABNORMAL HIGH (ref 65–99)
Glucose-Capillary: 201 mg/dL — ABNORMAL HIGH (ref 65–99)
Glucose-Capillary: 176 mg/dL — ABNORMAL HIGH (ref 65–99)
Glucose-Capillary: 203 mg/dL — ABNORMAL HIGH (ref 65–99)

## 2016-07-07 LAB — CBC
HEMATOCRIT: 36.3 % (ref 36.0–46.0)
HEMOGLOBIN: 12.3 g/dL (ref 12.0–15.0)
MCH: 30.6 pg (ref 26.0–34.0)
MCHC: 33.9 g/dL (ref 30.0–36.0)
MCV: 90.3 fL (ref 78.0–100.0)
Platelets: 154 10*3/uL (ref 150–400)
RBC: 4.02 MIL/uL (ref 3.87–5.11)
RDW: 13.3 % (ref 11.5–15.5)
WBC: 11.5 10*3/uL — ABNORMAL HIGH (ref 4.0–10.5)

## 2016-07-07 LAB — MRSA PCR SCREENING: MRSA BY PCR: NEGATIVE

## 2016-07-07 LAB — CREATININE, SERUM
Creatinine, Ser: 0.76 mg/dL (ref 0.44–1.00)
GFR calc Af Amer: >60 mL/min (ref >60)
GFR calc non Af Amer: >60 mL/min (ref >60)

## 2016-07-07 SURGERY — HEMIARTHROPLASTY, HIP, DIRECT ANTERIOR APPROACH, FOR FRACTURE
Anesthesia: General | Laterality: Right

## 2016-07-07 MED ORDER — LACTATED RINGERS IV SOLN
INTRAVENOUS | Status: DC | PRN
  Administered 2016-07-07 (×2): via INTRAVENOUS

## 2016-07-07 MED ORDER — METOCLOPRAMIDE HCL 5 MG/ML IJ SOLN: 5.0000 mg | Freq: Three times a day (TID) | INTRAMUSCULAR | Status: DC | PRN

## 2016-07-07 MED ORDER — 0.9 % SODIUM CHLORIDE (POUR BTL) OPTIME
TOPICAL | Status: DC | PRN
  Administered 2016-07-07: 1000 mL

## 2016-07-07 MED ORDER — TRAMADOL-ACETAMINOPHEN 37.5-325 MG PO TABS
1.0000 | ORAL_TABLET | ORAL | Status: DC | PRN
  Administered 2016-07-07 – 2016-07-08 (×2): 1 via ORAL
  Administered 2016-07-08 (×2): 2 via ORAL
  Filled 2016-07-07 (×2): qty 2
  Filled 2016-07-07 (×2): qty 1

## 2016-07-07 MED ORDER — METHOCARBAMOL 1000 MG/10ML IJ SOLN
500.0000 mg | Freq: Four times a day (QID) | INTRAVENOUS | Status: DC | PRN
  Filled 2016-07-07: qty 5

## 2016-07-07 MED ORDER — FENTANYL CITRATE (PF) 100 MCG/2ML IJ SOLN
INTRAMUSCULAR | Status: AC
  Filled 2016-07-07: qty 2

## 2016-07-07 MED ORDER — EPHEDRINE 5 MG/ML INJ
INTRAVENOUS | Status: AC
  Filled 2016-07-07: qty 10

## 2016-07-07 MED ORDER — SUCCINYLCHOLINE CHLORIDE 200 MG/10ML IV SOSY
PREFILLED_SYRINGE | INTRAVENOUS | Status: AC
  Filled 2016-07-07: qty 10

## 2016-07-07 MED ORDER — ENOXAPARIN SODIUM 30 MG/0.3ML ~~LOC~~ SOLN
30.0000 mg | SUBCUTANEOUS | Status: DC
  Administered 2016-07-08 – 2016-07-09 (×2): 30 mg via SUBCUTANEOUS
  Filled 2016-07-07 (×2): qty 0.3

## 2016-07-07 MED ORDER — HYDRALAZINE HCL 20 MG/ML IJ SOLN
10.0000 mg | Freq: Once | INTRAMUSCULAR | Status: AC
  Administered 2016-07-07: 10 mg via INTRAVENOUS

## 2016-07-07 MED ORDER — ONDANSETRON HCL 4 MG/2ML IJ SOLN: 4.0000 mg | Freq: Four times a day (QID) | INTRAMUSCULAR | Status: DC | PRN

## 2016-07-07 MED ORDER — ONDANSETRON HCL 4 MG/2ML IJ SOLN
INTRAMUSCULAR | Status: DC | PRN
  Administered 2016-07-07: 4 mg via INTRAVENOUS

## 2016-07-07 MED ORDER — ETOMIDATE 2 MG/ML IV SOLN
INTRAVENOUS | Status: DC | PRN
  Administered 2016-07-07: 8 mg via INTRAVENOUS

## 2016-07-07 MED ORDER — LIDOCAINE HCL (CARDIAC) 20 MG/ML IV SOLN
INTRAVENOUS | Status: DC | PRN
  Administered 2016-07-07: 40 mg via INTRAVENOUS

## 2016-07-07 MED ORDER — CHLORHEXIDINE GLUCONATE 4 % EX LIQD
60.0000 mL | Freq: Once | CUTANEOUS | Status: AC
  Administered 2016-07-07: 4 via TOPICAL

## 2016-07-07 MED ORDER — FENTANYL CITRATE (PF) 100 MCG/2ML IJ SOLN
INTRAMUSCULAR | Status: DC | PRN
  Administered 2016-07-07 (×5): 50 ug via INTRAVENOUS

## 2016-07-07 MED ORDER — LABETALOL HCL 5 MG/ML IV SOLN
INTRAVENOUS | Status: DC | PRN
  Administered 2016-07-07: 5 mg via INTRAVENOUS

## 2016-07-07 MED ORDER — SUGAMMADEX SODIUM 200 MG/2ML IV SOLN
INTRAVENOUS | Status: DC | PRN
  Administered 2016-07-07: 200 mg via INTRAVENOUS

## 2016-07-07 MED ORDER — BUPIVACAINE HCL (PF) 0.5 % IJ SOLN
INTRAMUSCULAR | Status: AC
  Filled 2016-07-07: qty 30

## 2016-07-07 MED ORDER — EPINEPHRINE PF 1 MG/ML IJ SOLN
INTRAMUSCULAR | Status: AC
  Filled 2016-07-07: qty 1

## 2016-07-07 MED ORDER — SODIUM CHLORIDE 0.9 % IR SOLN
Status: DC | PRN
  Administered 2016-07-07: 3000 mL

## 2016-07-07 MED ORDER — FENTANYL CITRATE (PF) 100 MCG/2ML IJ SOLN
INTRAMUSCULAR | Status: AC
  Filled 2016-07-07: qty 4

## 2016-07-07 MED ORDER — ONDANSETRON HCL 4 MG PO TABS: 4.0000 mg | ORAL_TABLET | Freq: Four times a day (QID) | ORAL | Status: DC | PRN

## 2016-07-07 MED ORDER — SODIUM CHLORIDE 0.9 % IV SOLN
INTRAVENOUS | Status: DC | PRN
  Administered 2016-07-07: 500 mL

## 2016-07-07 MED ORDER — KETOROLAC TROMETHAMINE 30 MG/ML IJ SOLN
INTRAMUSCULAR | Status: DC | PRN
  Administered 2016-07-07: 30 mg

## 2016-07-07 MED ORDER — ONDANSETRON HCL 4 MG/2ML IJ SOLN
INTRAMUSCULAR | Status: AC
  Filled 2016-07-07: qty 2

## 2016-07-07 MED ORDER — EPHEDRINE SULFATE 50 MG/ML IJ SOLN
INTRAMUSCULAR | Status: DC | PRN
  Administered 2016-07-07: 15 mg via INTRAVENOUS

## 2016-07-07 MED ORDER — METOCLOPRAMIDE HCL 5 MG PO TABS: 5.0000 mg | ORAL_TABLET | Freq: Three times a day (TID) | ORAL | Status: DC | PRN

## 2016-07-07 MED ORDER — PROPOFOL 10 MG/ML IV BOLUS
INTRAVENOUS | Status: AC
  Filled 2016-07-07: qty 20

## 2016-07-07 MED ORDER — POVIDONE-IODINE 10 % EX SWAB
2.0000 "application " | Freq: Once | CUTANEOUS | Status: AC
  Administered 2016-07-07: 2 via TOPICAL

## 2016-07-07 MED ORDER — PHENOL 1.4 % MT LIQD: 1.0000 | OROMUCOSAL | Status: DC | PRN

## 2016-07-07 MED ORDER — METHOCARBAMOL 500 MG PO TABS
500.0000 mg | ORAL_TABLET | Freq: Four times a day (QID) | ORAL | Status: DC | PRN
  Administered 2016-07-08: 500 mg via ORAL
  Filled 2016-07-07: qty 1

## 2016-07-07 MED ORDER — TRANEXAMIC ACID 1000 MG/10ML IV SOLN
1000.0000 mg | INTRAVENOUS | Status: AC
  Administered 2016-07-07: 1000 mg via INTRAVENOUS
  Filled 2016-07-07: qty 10

## 2016-07-07 MED ORDER — VANCOMYCIN HCL 500 MG IV SOLR
500.0000 mg | Freq: Two times a day (BID) | INTRAVENOUS | Status: AC
  Administered 2016-07-08: 500 mg via INTRAVENOUS
  Filled 2016-07-07: qty 500

## 2016-07-07 MED ORDER — ACETAMINOPHEN 325 MG PO TABS: 650.0000 mg | ORAL_TABLET | Freq: Four times a day (QID) | ORAL | Status: DC | PRN

## 2016-07-07 MED ORDER — ACETAMINOPHEN 650 MG RE SUPP: 650.0000 mg | Freq: Four times a day (QID) | RECTAL | Status: DC | PRN

## 2016-07-07 MED ORDER — BUPIVACAINE-EPINEPHRINE (PF) 0.5% -1:200000 IJ SOLN
INTRAMUSCULAR | Status: DC | PRN
  Administered 2016-07-07: 30 mL via PERINEURAL

## 2016-07-07 MED ORDER — SODIUM CHLORIDE 0.9 % IJ SOLN
INTRAMUSCULAR | Status: DC | PRN
  Administered 2016-07-07 (×2): 10 mL

## 2016-07-07 MED ORDER — ROCURONIUM BROMIDE 100 MG/10ML IV SOLN
INTRAVENOUS | Status: DC | PRN
  Administered 2016-07-07: 20 mg via INTRAVENOUS
  Administered 2016-07-07: 30 mg via INTRAVENOUS

## 2016-07-07 MED ORDER — MENTHOL 3 MG MT LOZG: 1.0000 | LOZENGE | OROMUCOSAL | Status: DC | PRN

## 2016-07-07 MED ORDER — VANCOMYCIN HCL IN DEXTROSE 1-5 GM/200ML-% IV SOLN
1000.0000 mg | INTRAVENOUS | Status: AC
  Administered 2016-07-07: 1000 mg via INTRAVENOUS
  Filled 2016-07-07: qty 200

## 2016-07-07 MED ORDER — ACETAMINOPHEN 10 MG/ML IV SOLN
1000.0000 mg | INTRAVENOUS | Status: AC
  Administered 2016-07-07: 1000 mg via INTRAVENOUS
  Filled 2016-07-07: qty 100

## 2016-07-07 MED ORDER — HYDRALAZINE HCL 20 MG/ML IJ SOLN
INTRAMUSCULAR | Status: AC
  Administered 2016-07-07: 10 mg via INTRAVENOUS
  Filled 2016-07-07: qty 1

## 2016-07-07 SURGICAL SUPPLY — 51 items
BLADE SAW SGTL 18X1.27X75 (BLADE) ×2 IMPLANT
BLADE SURG ROTATE 9660 (MISCELLANEOUS) IMPLANT
CAPT HIP HEMI 2B ×2 IMPLANT
CHLORAPREP W/TINT 26ML (MISCELLANEOUS) ×2 IMPLANT
COVER SURGICAL LIGHT HANDLE (MISCELLANEOUS) ×2 IMPLANT
DERMABOND ADVANCED (GAUZE/BANDAGES/DRESSINGS) ×2
DERMABOND ADVANCED .7 DNX12 (GAUZE/BANDAGES/DRESSINGS) ×2 IMPLANT
DRAPE C-ARM 42X72 X-RAY (DRAPES) ×2 IMPLANT
DRAPE IMP U-DRAPE 54X76 (DRAPES) ×4 IMPLANT
DRAPE STERI IOBAN 125X83 (DRAPES) ×2 IMPLANT
DRAPE U-SHAPE 47X51 STRL (DRAPES) ×6 IMPLANT
DRSG AQUACEL AG ADV 3.5X10 (GAUZE/BANDAGES/DRESSINGS) ×2 IMPLANT
ELECT BLADE 4.0 EZ CLEAN MEGAD (MISCELLANEOUS) ×2
ELECT REM PT RETURN 9FT ADLT (ELECTROSURGICAL) ×2
ELECTRODE BLDE 4.0 EZ CLN MEGD (MISCELLANEOUS) ×1 IMPLANT
ELECTRODE REM PT RTRN 9FT ADLT (ELECTROSURGICAL) ×1 IMPLANT
EVACUATOR 1/8 PVC DRAIN (DRAIN) IMPLANT
GLOVE BIO SURGEON STRL SZ8.5 (GLOVE) ×4 IMPLANT
GLOVE BIOGEL PI IND STRL 8.5 (GLOVE) ×1 IMPLANT
GLOVE BIOGEL PI INDICATOR 8.5 (GLOVE) ×1
GOWN STRL REUS W/ TWL LRG LVL3 (GOWN DISPOSABLE) ×2 IMPLANT
GOWN STRL REUS W/TWL 2XL LVL3 (GOWN DISPOSABLE) ×2 IMPLANT
GOWN STRL REUS W/TWL LRG LVL3 (GOWN DISPOSABLE) ×2
HANDPIECE INTERPULSE COAX TIP (DISPOSABLE) ×1
HOOD PEEL AWAY FACE SHEILD DIS (HOOD) ×4 IMPLANT
KIT BASIN OR (CUSTOM PROCEDURE TRAY) ×2 IMPLANT
KIT ROOM TURNOVER OR (KITS) ×2 IMPLANT
MANIFOLD NEPTUNE II (INSTRUMENTS) ×2 IMPLANT
MARKER SKIN DUAL TIP RULER LAB (MISCELLANEOUS) ×2 IMPLANT
NEEDLE SPNL 18GX3.5 QUINCKE PK (NEEDLE) ×2 IMPLANT
NS IRRIG 1000ML POUR BTL (IV SOLUTION) ×2 IMPLANT
PACK TOTAL JOINT (CUSTOM PROCEDURE TRAY) ×2 IMPLANT
PACK UNIVERSAL I (CUSTOM PROCEDURE TRAY) ×2 IMPLANT
PAD ARMBOARD 7.5X6 YLW CONV (MISCELLANEOUS) ×4 IMPLANT
SEALER BIPOLAR AQUA 6.0 (INSTRUMENTS) ×2 IMPLANT
SET HNDPC FAN SPRY TIP SCT (DISPOSABLE) ×1 IMPLANT
SUCTION FRAZIER HANDLE 10FR (MISCELLANEOUS) ×1
SUCTION TUBE FRAZIER 10FR DISP (MISCELLANEOUS) ×1 IMPLANT
SUT ETHIBOND NAB CT1 #1 30IN (SUTURE) ×4 IMPLANT
SUT MNCRL AB 3-0 PS2 18 (SUTURE) ×2 IMPLANT
SUT MON AB 2-0 CT1 36 (SUTURE) ×2 IMPLANT
SUT VIC AB 1 CT1 27 (SUTURE) ×1
SUT VIC AB 1 CT1 27XBRD ANBCTR (SUTURE) ×1 IMPLANT
SUT VIC AB 2-0 CT1 27 (SUTURE) ×1
SUT VIC AB 2-0 CT1 TAPERPNT 27 (SUTURE) ×1 IMPLANT
SUT VLOC 180 0 24IN GS25 (SUTURE) ×2 IMPLANT
SYR 50ML LL SCALE MARK (SYRINGE) ×2 IMPLANT
TOWEL OR 17X24 6PK STRL BLUE (TOWEL DISPOSABLE) ×2 IMPLANT
TOWEL OR 17X26 10 PK STRL BLUE (TOWEL DISPOSABLE) ×2 IMPLANT
TRAY FOLEY CATH 16FR SILVER (SET/KITS/TRAYS/PACK) IMPLANT
WATER STERILE IRR 1000ML POUR (IV SOLUTION) IMPLANT

## 2016-07-07 NOTE — H&P (View-Only) (Signed)
ORTHOPAEDIC CONSULTATION  REQUESTING PHYSICIAN: Earl LagosNischal Narendra, MD  PCP:  Noemi ChapelBethany Molt, DO  Chief Complaint: Displaced right femoral neck fracture  HPI: Sharon [name] is a 82 y.o. female who was previously hospitalized about a month ago for severe hyperglycemia, uncontrolled diabetes. She was found to have stage II pressure sores to her sacrum and right heel. She has severe dementia and is dependent on her family for ADLs. She is a Engineer, technical saleshousehold ambulator. She tripped and fell yesterday and injured her right hip. She was admitted by the hospitalist for right femoral neck fracture. Orthopedic consultation was placed for management of her fracture. Of note, in the emergency department her blood sugar was over 400.  Past Medical History:  Diagnosis Date  . Alzheimer's dementia    "dx'd 05/2015; final stages" (06/15/2016)  . Complication of anesthesia    "problems waking up from gallbladder OR in 1978"  . COPD (chronic obstructive pulmonary disease) (HCC)   . High cholesterol   . History of blood transfusion 1996   "related to appendix OR"  . Type II diabetes mellitus (HCC) 1998   Past Surgical History:  Procedure Laterality Date  . APPENDECTOMY  1986  . CATARACT EXTRACTION W/ INTRAOCULAR LENS  IMPLANT, BILATERAL Bilateral   . CHOLECYSTECTOMY  1978  . DILATION AND CURETTAGE OF UTERUS     Social History   Social History  . Marital status: Divorced    Spouse name: N/A  . Number of children: N/A  . Years of education: N/A   Social History Main Topics  . Smoking status: Never Smoker  . Smokeless tobacco: Never Used  . Alcohol use No  . Drug use: No  . Sexual activity: Not Asked   Other Topics Concern  . None   Social History Narrative  . None   History reviewed. No pertinent family history. Allergies  Allergen Reactions  . Codeine Hives and Itching    Hives itching  . Other Other (See Comments)    Anesthesia-trouble waking up  . Penicillins Other (See Comments)   Anaphylactic shock Has patient had a PCN reaction causing immediate rash, facial/tongue/throat swelling, SOB or lightheadedness with hypotension:YES Has patient had a PCN reaction causing severe rash involving mucus membranes or skin necrosis: NO Has patient had a PCN reaction that required hospitalization NO Has patient had a PCN reaction occurring within the last 10 years:NO If all of the above answers are "NO", then may proceed with Cephalosporin use.  . Shellfish Allergy Hives    hives   Prior to Admission medications   Medication Sig Start Date End Date Taking? Authorizing Provider  Ascorbic Acid (VITA-C PO) Take 1 tablet by mouth daily as needed (for immune).   Yes Historical Provider, MD  CINNAMON PO Take 2 tablets by mouth 2 (two) times daily.    Yes Historical Provider, MD  feeding supplement, GLUCERNA SHAKE, (GLUCERNA SHAKE) LIQD Take 237 mLs by mouth 2 (two) times daily between meals. 06/17/16  Yes Nyra MarketGorica Svalina, MD  ibuprofen (ADVIL,MOTRIN) 200 MG tablet Take 200-400 mg by mouth every 6 (six) hours as needed for headache or moderate pain.   Yes Historical Provider, MD  insulin aspart protamine- aspart (NOVOLOG MIX 70/30) (70-30) 100 UNIT/ML injection Inject 0.18 mLs (18 Units total) into the skin 2 (two) times daily with a meal. 06/27/16  Yes Lora PaulaJennifer T Krall, MD  nitrofurantoin, macrocrystal-monohydrate, (MACROBID) 100 MG capsule Take 100 mg by mouth 2 (two) times daily.   Yes Historical Provider, MD  Omega 3-6-9 Fatty Acids (OMEGA 3-6-9 COMPLEX) CAPS Take 1 capsule by mouth daily.   Yes Historical Provider, MD  simvastatin (ZOCOR) 10 MG tablet Take 10 mg by mouth daily.   Yes Historical Provider, MD   Dg Chest 1 View  Result Date: 07/05/2016 CLINICAL DATA:  Status post fall EXAM: CHEST 1 VIEW COMPARISON:  None. FINDINGS: There is atherosclerotic calcification within the aortic arch. Cardiomediastinal contours are otherwise normal. There is no focal airspace consolidation or  pulmonary edema. No pneumothorax or sizable pleural effusion. IMPRESSION: No active disease.  Aortic atherosclerosis. Electronically Signed   By: Deatra RobinsonKevin  Herman M.D.   On: 07/05/2016 21:20   Dg Knee Complete 4 Views Right  Result Date: 07/05/2016 CLINICAL DATA:  Right knee pain after fall. EXAM: RIGHT KNEE - COMPLETE 4+ VIEW COMPARISON:  None. FINDINGS: No evidence of fracture, dislocation, or joint effusion. Mild narrowing of medial joint space is noted. Vascular calcifications are noted. IMPRESSION: Mild degenerative joint disease is noted medially. No acute abnormality seen in the right knee. Electronically Signed   By: Lupita RaiderJames  Green Jr, M.D.   On: 07/05/2016 21:21   Dg Hip Unilat With Pelvis 2-3 Views Right  Result Date: 07/05/2016 CLINICAL DATA:  Fall. History of Alzheimer's disease/dementia. RIGHT hip deformity. Hyperglycemia. EXAM: DG HIP (WITH OR WITHOUT PELVIS) 2-3V RIGHT COMPARISON:  None. FINDINGS: Acute RIGHT femoral neck fracture with impaction, fracture fragments in alignment. Femoral head is located. Osteopenia without destructive bony lesions. Severe vascular calcifications. IMPRESSION: Acute RIGHT femoral neck fracture without dislocation. Electronically Signed   By: Awilda Metroourtnay  Bloomer M.D.   On: 07/05/2016 21:20    Positive ROS: All other systems have been reviewed and were otherwise negative with the exception of those mentioned in the HPI and as above.  Physical Exam: General: Alert, no acute distress Cardiovascular: No pedal edema Respiratory: No cyanosis, no use of accessory musculature GI: No organomegaly, abdomen is soft and non-tender Skin: No lesions in the area of chief complaint Neurologic: Sensation intact distally Psychiatric: Patient is competent for consent with normal mood and affect Lymphatic: No axillary or cervical lymphadenopathy  MUSCULOSKELETAL: Examination of the right hip reveals no skin wounds or lesions the lower extremity shortened and externally  rotated. She does have a pressure sore over the right heel. She also has a pressure sore over the sacrum that is healing well, no overt evidence of infection. She has pain with attempted logrolling of the hip.  Assessment: Displaced right femoral neck fracture Uncontrolled diabetes Pressure sores to right heel and sacrum  Plan: I discussed the finding with the patient and her family. They understand that she is in a difficult situation. The risk of surgery and her would be higher than normal due to her elevated blood sugar and her decubitus ulcers. On the other hand, if we do not fix her hip, she will be bedridden and suffer the complications associated with this. We will plan for right hip hemiarthroplasty once her sugars are consistently below 200. Surgery possibly tomorrow depending on blood sugar status. She may have an oral diet today; nothing by mouth after midnight.    Sharon Butler, Cloyde ReamsBrian James, MD Cell [phone]    07/06/2016 7:54 AM

## 2016-07-07 NOTE — Progress Notes (Signed)
Internal Medicine Attending:   I saw and examined the patient. I reviewed the resident's note and I agree with the resident's findings and plan as documented in the resident's note.  Patient denies any new complaints today. She presented to the ED status post fall and was found to have a displaced right femoral neck fracture. Patient is now status post right hip replacement. She'll need PT follow-up and possible discharge to SNF once cleared for discharge. Continue with pain control for now. This right hip fracture from a fall from sitting height qualifies for osteoporosis. She will need to be started on a bisphosphonate and be evaluated for vitamin D deficiency.  Patient is also noted to be hyperglycemic on admission. She is restarted on her home dose of NovoLog as well as a sensitive insulin sliding scale. Blood sugars have improved. We will continue with current regimen for now.

## 2016-07-07 NOTE — Progress Notes (Signed)
   Subjective: Patient was evaluated this morning. She was resting comfortably in bed. She denied pain and shortness of breath. Per husband, patient had a good night and slept well.    Objective:  Vital signs in last 24 hours: Vitals:   07/06/16 2108 07/07/16 0649 07/07/16 0752 07/07/16 0756  BP: (!) 137/48 (!) 194/85 (!) 167/69   Pulse: 67 73 74   Resp: 16 16 18    Temp: 97.9 F (36.6 C) 98.6 F (37 C) 98.6 F (37 C)   TempSrc: Oral Tympanic Oral   SpO2: 98% 95% 93% 95%   Physical Exam  Constitutional: No distress.  Cardiovascular: Normal rate, regular rhythm and normal heart sounds.  Exam reveals no gallop and no friction rub.   No murmur heard. Pulmonary/Chest: Effort normal and breath sounds normal. No respiratory distress. She has no wheezes.  Abdominal: Soft. Bowel sounds are normal. She exhibits no distension. There is no tenderness.  Musculoskeletal: She exhibits no edema.  Right heel ulcer, bandaged     Assessment/Plan:  Principal Problem:   Closed displaced fracture of right femoral neck (HCC) Active Problems:   Diabetes mellitus (HCC)   Decubitus ulcer of right foot, stage 2   Decubitus ulcer of sacral region, stage 2   Dementia  Femoral neck fracture  Her pain is currently controlled, Orthopedic surgery has evaluated the patient and plans for surgery today.  She was NPO at midnight.   -Orthopedic surgery is following, plan for surgery today -Foley catheter  -Miralax 17 g daily  -Tramadol- acetaminophel 37.5 mg q6h PRN  - PT after surgery - OT - Social work for SNF placement  Alzheimer's dementia  Patient has a history of alzheimer's dementia which her family feels is at baseline. She is at increased risk for hospital delirium in the setting of her hip fracture and immobilization.  - Continue Delirium precautions  -Ordered sitter at bedside  -Foley catheter in place for incontinence   Type 2 Diabetes  Last HbA1c 05/2016 10.5. Home medication is  novolog 70/30 18 U BID.  Yesterday was restarted on home dose of 18U and sensitive SSI.  CBG last night was 179.   - continue insulin regimen after surgery  Osteoporosis  Fragility fracture in this elder lady qualifies as a diagnosis of osteoporosis. She will need evaluation for vitamin D deficiency and need to start bisphosphonate therapy after surgery inpatient.   Chronic nonhealing wounds  She has a history of non healing wounds over her sacrum and right heal since hospitalization in November in [place]. Wound care was consulted yesterday.  Patient's right heal is well bandaged. - appreciate wound care recommendations   Diet Heart healthy after surgery  DVT Ppxsubq lovenox  Code StatusFULL   Dispo: Anticipated discharge pending clinical improvement.   Sharon PhenesJessica Ratliff Sedrick Tober, DO 07/07/2016, 10:05 AM Pager: [phone]

## 2016-07-07 NOTE — Anesthesia Procedure Notes (Signed)
Procedure Name: Intubation Date/Time: 07/07/2016 12:53 PM Performed by: Rogelia BogaMUELLER, Noa Constante P Pre-anesthesia Checklist: Patient identified, Emergency Drugs available, Suction available, Patient being monitored and Timeout performed Patient Re-evaluated:Patient Re-evaluated prior to inductionOxygen Delivery Method: Circle system utilized Preoxygenation: Pre-oxygenation with 100% oxygen Intubation Type: IV induction Ventilation: Mask ventilation without difficulty Laryngoscope Size: Mac and 4 Grade View: Grade I Tube type: Oral Tube size: 7.0 mm Number of attempts: 1 Airway Equipment and Method: Stylet Placement Confirmation: ETT inserted through vocal cords under direct vision,  positive ETCO2 and breath sounds checked- equal and bilateral Secured at: 20 cm Tube secured with: Tape Dental Injury: Teeth and Oropharynx as per pre-operative assessment

## 2016-07-07 NOTE — Progress Notes (Signed)
Internal Medicine Clinic Attending  Case discussed with Dr. Krall at the time of the visit.  We reviewed the resident's history and exam and pertinent patient test results.  I agree with the assessment, diagnosis, and plan of care documented in the resident's note.  

## 2016-07-07 NOTE — Assessment & Plan Note (Addendum)
Assessment: Tolerating higher dose of Novolog 70/30 without episodes of hypoglycemia.  Plan:  Continue Novolog 70/30 18 units BID Referral to Diabetes Educator Strips and lancets refilled

## 2016-07-07 NOTE — Progress Notes (Signed)
Patient's blood glucose is improving. She was below 200 yesterday. I discussed the patient's situation with her family including her daughter Gavin PoundDeborah. She is in somewhat of a difficult position, because as we are waiting for her blood sugars to improve, she is confined to bed. Her sugar is improving, and therefore I recommend we proceed with surgery to allow her to mobilize. She is at an increased risk of perioperative wound complications due to her uncontrolled diabetes and decubitus ulcers on her sacrum and right heel. The family understands. We will plan for surgery today. Continue nothing by mouth. Hold chemical DVT prophylaxis.

## 2016-07-07 NOTE — Assessment & Plan Note (Signed)
Assessment: She has redeveloped the yellow tissue around the edges. Unable to tolerate hydrocellular foam.  Plan:  Family agreeable to visiting the wound care center Discussed high protein diet to aid in wound healing

## 2016-07-07 NOTE — Anesthesia Preprocedure Evaluation (Addendum)
Anesthesia Evaluation  Patient identified by MRN, date of birth, ID band Patient awake    Reviewed: Allergy & Precautions, NPO status , Patient's Chart, lab work & pertinent test results  Airway Mallampati: II  TM Distance: >3 FB Neck ROM: Full    Dental  (+) Poor Dentition, Missing, Loose, Dental Advisory Given   Pulmonary COPD,    breath sounds clear to auscultation       Cardiovascular negative cardio ROS   Rhythm:Regular Rate:Normal     Neuro/Psych PSYCHIATRIC DISORDERS    GI/Hepatic negative GI ROS, Neg liver ROS,   Endo/Other  diabetes, Poorly Controlled, Type 2, Insulin Dependent  Renal/GU negative Renal ROS  negative genitourinary   Musculoskeletal negative musculoskeletal ROS (+)   Abdominal   Peds negative pediatric ROS (+)  Hematology negative hematology ROS (+)   Anesthesia Other Findings   Reproductive/Obstetrics negative OB ROS                           Lab Results  Component Value Date   WBC 8.2 07/05/2016   HGB 14.5 07/05/2016   HCT 42.7 07/05/2016   MCV 90.1 07/05/2016   PLT 173 07/05/2016   Lab Results  Component Value Date   CREATININE 0.80 07/05/2016   BUN 30 (H) 07/05/2016   NA 135 07/05/2016   K 4.3 07/05/2016   CL 101 07/05/2016   CO2 26 07/05/2016   Lab Results  Component Value Date   INR 0.94 07/05/2016   EKG: NSR, RBBB  Anesthesia Physical Anesthesia Plan  ASA: III  Anesthesia Plan: General   Post-op Pain Management:    Induction: Intravenous  Airway Management Planned: Oral ETT  Additional Equipment:   Intra-op Plan:   Post-operative Plan: Extubation in OR  Informed Consent: I have reviewed the patients History and Physical, chart, labs and discussed the procedure including the risks, benefits and alternatives for the proposed anesthesia with the patient or authorized representative who has indicated his/her understanding and  acceptance.   Dental advisory given  Plan Discussed with: CRNA, Anesthesiologist and Surgeon  Anesthesia Plan Comments: (No cardiac workup on file. No significant history per family. No active cardiac complaints per family. EKG ordered. )     Anesthesia Quick Evaluation

## 2016-07-07 NOTE — Interval H&P Note (Signed)
History and Physical Interval Note:  07/07/2016 12:07 PM  Sharon Butler  has presented today for surgery, with the diagnosis of hip fracture right  The various methods of treatment have been discussed with the patient and family. After consideration of risks, benefits and other options for treatment, the patient has consented to  Procedure(s): ANTERIOR APPROACH HEMI HIP ARTHROPLASTY (Right) as a surgical intervention .  The patient's history has been reviewed, patient examined, no change in status, stable for surgery.  I have reviewed the patient's chart and labs.  Questions were answered to the patient's satisfaction.     Sharon Butler, Cloyde ReamsBrian James

## 2016-07-07 NOTE — Anesthesia Postprocedure Evaluation (Signed)
Anesthesia Post Note  Patient: Sharon Butler  Procedure(s) Performed: Procedure(s) (LRB): ANTERIOR APPROACH HEMI HIP ARTHROPLASTY (Right)  Patient location during evaluation: PACU Anesthesia Type: General Level of consciousness: awake and alert and patient cooperative Pain management: pain level controlled Vital Signs Assessment: post-procedure vital signs reviewed and stable Respiratory status: spontaneous breathing and respiratory function stable Cardiovascular status: stable Anesthetic complications: no    Last Vitals:  Vitals:   07/07/16 1515 07/07/16 1547  BP: (!) 170/68 (!) 117/48  Pulse: 67 75  Resp: 13 15  Temp:      Last Pain:  Vitals:   07/07/16 1515  TempSrc:   PainSc: Asleep                 Eily Louvier S

## 2016-07-07 NOTE — Op Note (Signed)
OPERATIVE REPORT  SURGEON: Samson FredericBrian Aryiana Klinkner, MD   ASSISTANT: April Green, RNFA  PREOPERATIVE DIAGNOSIS: Displaced Right femoral neck fracture.   POSTOPERATIVE DIAGNOSIS: Displaced Right femoral neck fracture.   PROCEDURE: Right hip hemiarthroplasty, anterior approach.   IMPLANTS: DePuy Tri Lock stem, size 5, standard offset, with a -3 mm spacer and a 44 mm monopolar head ball.  ANESTHESIA:  General  ANTIBIOTICS: 1 g vancomycin.  ESTIMATED BLOOD LOSS: 200 mL.  DRAINS: None.  COMPLICATIONS: None   CONDITION: PACU - hemodynamically stable.   BRIEF CLINICAL NOTE: Sharon Butler is a 82 y.o. female with a displaced Right femoral neck fracture. The patient was admitted to the hospitalist service and underwent perioperative risk stratification and medical optimization. The risks, benefits, and alternatives to hemiarthroplasty were explained, and the patient elected to proceed.  PROCEDURE IN DETAIL: The patient was taken to the operating room and general anesthesia was induced on the hospital bed. The patient was then positioned on the Hana table. All bony prominences were well padded. The hip was prepped and draped in the normal sterile surgical fashion. A time-out was called verifying side and site of surgery. Antibiotics were given within 60 minutes of beginning the procedure.  The direct anterior approach to the hip was performed through the Hueter interval. Lateral femoral circumflex vessels were treated with the Auqumantys. The anterior capsule was exposed and an inverted T capsulotomy was made. Fracture hematoma was encountered and evacuated. The patient was found to have a comminuted Right subcapital femoral neck fracture. I freshened the femoral neck cut with a saw. I removed the femoral neck fragment. A corkscrew was placed into the head and the head was removed. This was passed to the back table and was measured.  Acetabular exposure was achieved. I examined the  articular cartilage which was intact. The labrum was intact. A 44 mm trial head was placed and found to have excellent fit.  I then gained femoral exposure taking care to protect the abductors and greater trochanter. This was performed using standard external rotation, extension, and adduction. The capsule was peeled off the inner aspect of the greater trochanter, taking care to preserve the short external rotators. A cookie cutter was used to enter the femoral canal, and then the femoral canal finder was used to confirm location. I then sequentially broached up to a size 5. Calcar planer was used on the femoral neck remnant. I paced a std neck and a 36+ 1.5 mm head ball.The hip was reduced. Leg lengths were checked fluoroscopically. The hip was dislocated and trial components were removed. I placed the real stem followed by the real spacer and head ball. A single reduction maneuver was performed and the hip was reduced. Fluoroscopy was used to confirm component position and leg lengths. At 90 degrees of external rotation and extension, the hip was stable to an anterior directed force.  The wound was copiously irrigated with normal saline solution. Marcaine solution was injected into the periarticular soft tissue. The wound was closed in layers using #1 Vicryl and V-Loc for the fascia, 2-0 Vicryl for the subcutaneous fat, 2-0 Monocryl for the deep dermal layer, 3-0 running Monocryl subcuticular stitch and glue for the skin. Once the glue was fully dried, an Aquacell Ag dressing was applied. The patient was then awakened from anesthesia and transported to the recovery room in stable condition. Sponge, needle, and instrument counts were correct at the end of the case x2. The patient tolerated the procedure well and there were  no known complications.

## 2016-07-07 NOTE — Transfer of Care (Signed)
Immediate Anesthesia Transfer of Care Note  Patient: Sharon Butler  Procedure(s) Performed: Procedure(s): ANTERIOR APPROACH HEMI HIP ARTHROPLASTY (Right)  Patient Location: PACU  Anesthesia Type:General  Level of Consciousness: awake, alert , oriented and patient cooperative  Airway & Oxygen Therapy: Patient Spontanous Breathing and Patient connected to nasal cannula oxygen  Post-op Assessment: Report given to RN, Post -op Vital signs reviewed and stable and Patient moving all extremities X 4  Post vital signs: Reviewed and stable  Last Vitals:  Vitals:   07/07/16 0752 07/07/16 1445  BP: (!) 167/69   Pulse: 74   Resp: 18   Temp: 37 C 36.4 C    Last Pain:  Vitals:   07/07/16 1445  TempSrc:   PainSc: (P) 0-No pain         Complications: No apparent anesthesia complications

## 2016-07-07 NOTE — Progress Notes (Signed)
Report called to RN Kendal HymenBonnie in pre-op. CHG bath completed, MRSA swab sent

## 2016-07-08 ENCOUNTER — Encounter (HOSPITAL_COMMUNITY): Payer: Self-pay | Admitting: Orthopedic Surgery

## 2016-07-08 ENCOUNTER — Telehealth: Payer: Self-pay | Admitting: Dietician

## 2016-07-08 DIAGNOSIS — F0280 Dementia in other diseases classified elsewhere without behavioral disturbance: Secondary | ICD-10-CM

## 2016-07-08 DIAGNOSIS — Z960 Presence of urogenital implants: Secondary | ICD-10-CM

## 2016-07-08 DIAGNOSIS — E11621 Type 2 diabetes mellitus with foot ulcer: Secondary | ICD-10-CM

## 2016-07-08 DIAGNOSIS — M80051D Age-related osteoporosis with current pathological fracture, right femur, subsequent encounter for fracture with routine healing: Secondary | ICD-10-CM

## 2016-07-08 DIAGNOSIS — L97419 Non-pressure chronic ulcer of right heel and midfoot with unspecified severity: Secondary | ICD-10-CM

## 2016-07-08 DIAGNOSIS — Z794 Long term (current) use of insulin: Secondary | ICD-10-CM

## 2016-07-08 DIAGNOSIS — X58XXXD Exposure to other specified factors, subsequent encounter: Secondary | ICD-10-CM

## 2016-07-08 DIAGNOSIS — Z79899 Other long term (current) drug therapy: Secondary | ICD-10-CM

## 2016-07-08 DIAGNOSIS — S72001D Fracture of unspecified part of neck of right femur, subsequent encounter for closed fracture with routine healing: Secondary | ICD-10-CM

## 2016-07-08 DIAGNOSIS — G309 Alzheimer's disease, unspecified: Secondary | ICD-10-CM

## 2016-07-08 LAB — CBC
HCT: 35.9 % — ABNORMAL LOW (ref 36.0–46.0)
HEMOGLOBIN: 12 g/dL (ref 12.0–15.0)
MCH: 30.2 pg (ref 26.0–34.0)
MCHC: 33.4 g/dL (ref 30.0–36.0)
MCV: 90.2 fL (ref 78.0–100.0)
PLATELETS: 139 10*3/uL — AB (ref 150–400)
RBC: 3.98 MIL/uL (ref 3.87–5.11)
RDW: 13.3 % (ref 11.5–15.5)
WBC: 8 10*3/uL (ref 4.0–10.5)

## 2016-07-08 LAB — GLUCOSE, CAPILLARY
Glucose-Capillary: 171 mg/dL — ABNORMAL HIGH (ref 65–99)
Glucose-Capillary: 230 mg/dL — ABNORMAL HIGH (ref 65–99)

## 2016-07-08 LAB — BASIC METABOLIC PANEL
Anion gap: 6 (ref 5–15)
BUN: 12 mg/dL (ref 6–20)
CO2: 28 mmol/L (ref 22–32)
CREATININE: 0.73 mg/dL (ref 0.44–1.00)
Calcium: 8.9 mg/dL (ref 8.9–10.3)
Chloride: 103 mmol/L (ref 101–111)
GFR calc Af Amer: >60 mL/min (ref >60)
GFR calc non Af Amer: >60 mL/min (ref >60)
GLUCOSE: 165 mg/dL — AB (ref 65–99)
POTASSIUM: 3.9 mmol/L (ref 3.5–5.1)
Sodium: 137 mmol/L (ref 135–145)

## 2016-07-08 MED ORDER — FENTANYL CITRATE (PF) 100 MCG/2ML IJ SOLN
50.0000 ug | Freq: Once | INTRAMUSCULAR | Status: AC
  Administered 2016-07-08: 50 ug via INTRAVENOUS
  Filled 2016-07-08: qty 2

## 2016-07-08 MED ORDER — ALENDRONATE SODIUM 10 MG PO TABS: 70.0000 mg | ORAL_TABLET | ORAL | Status: DC

## 2016-07-08 MED ORDER — CALCIUM CITRATE-VITAMIN D 500-400 MG-UNIT PO CHEW
2.0000 | CHEWABLE_TABLET | Freq: Every day | ORAL | Status: DC
  Filled 2016-07-08 (×2): qty 2

## 2016-07-08 NOTE — Progress Notes (Signed)
Physical Therapy Evaluation Patient Details Name: Sharon [name] MRN: [id number] DOB: 09/29/1928 Today's Date: 07/08/2016   History of Present Illness  82 yo F who presents with closed displaced fracture of right femur. s/p R hip hemiarthroplasty with anterior approach. Pt with a significant PMH of T2DM, currently on insulin, and Alzheimer's that presented to the ED with hyperglycemia, UTI, ongoing pressure ulcer issues.  Clinical Impression  History was taken from husband. PTA, pt was transferring to all surfaces with assist from husband/daughter. Pt currently lives in mobile home with 3 steps to enter. Pt with difficulty maintaining standing balance today and demonstrated significant posterior lean upon standing. Pt required mod assist +2 to stand and pivot to chair. Pt limited by decreased cognition and demonstrated difficulty understanding cues during transfer from bed to stand to chair.  Husband confirmed that pt is close to her baseline function and feels confident in ability to transfer her as needed. Both husband and daughter would be available to supervise 24/7; however, upon calling, pt's daughter notes that she is unsure of where she would like her mother to go (SNF vs home) and will be discussing with her father today at lunch. Pt would be appropriate for home with HHPT pending family's ability/confidence to provide care; however, this PT recommends SNF 2/2 pt's mobility and strength deficits. PT will continue to follow acutely to educate pt and family on stair navigation and progress pt independence before d/c to next venue.      Follow Up Recommendations  (pt family is undecided; PT recommends SNF)    Equipment Recommendations  None recommended by PT    Recommendations for Other Services       Precautions / Restrictions Precautions Precautions: Fall Precaution Comments: h/o falls PTA, uses rollator Restrictions Weight Bearing Restrictions: Yes RLE Weight Bearing: Weight bearing  as tolerated      Mobility  Bed Mobility Overal bed mobility: Needs Assistance Bed Mobility: Supine to Sit     Supine to sit: Min assist;+2 for physical assistance     General bed mobility comments: Min hand held assist to give pt something to pull on to get to sitting EOB. Assist for LE mobility out of bed. Pt with decreased understanding of cues to get to the EOB.    Transfers Overall transfer level: Needs assistance Equipment used: Rolling walker (2 wheeled) Transfers: Sit to/from UGI CorporationStand;Stand Pivot Transfers Sit to Stand: +2 physical assistance;Min assist Stand pivot transfers: +2 safety/equipment;Mod assist       General transfer comment: Pt with signifiant posterior lean on standing, likely 2/2 anxiety with standing. Pt unable to get trunk upright but able to pivot with +2 mod assist from bed to chair. Cues for hand placement, trunk upright, and sequencing  Ambulation/Gait                Stairs            Wheelchair Mobility    Modified Rankin (Stroke Patients Only)       Balance Overall balance assessment: Needs assistance Sitting-balance support: Feet supported;No upper extremity supported;Bilateral upper extremity supported;Single extremity supported Sitting balance-Leahy Scale: Good     Standing balance support: Single extremity supported;Bilateral upper extremity supported Standing balance-Leahy Scale: Poor Standing balance comment: Pt requires B UE support and +2 mod assist to maintain standing balance 2/2 significant posteriorlean  Pertinent Vitals/Pain Pain Assessment: No/denies pain    Home Living Family/patient expects to be discharged to:: Private residence Living Arrangements: Children;Spouse/significant other Available Help at Discharge: Family;Available 24 hours/day Type of Home: Mobile home Home Access: Stairs to enter Entrance Stairs-Rails: None Entrance Stairs-Number of Steps: 3 Home  Layout: One level Home Equipment: Walker - 4 wheels;Cane - single point;Bedside commode;Shower seat;Wheelchair - manual Additional Comments: History given by husband. Husband confirms that they have all the equipment they need    Prior Function Level of Independence: Needs assistance   Gait / Transfers Assistance Needed: Uses rollator for mobility. Husband/daughter would typically transfer her to wheelchair/BSC/rollator  ADL's / Homemaking Assistance Needed: Husband/daughter assists 24/7 with transfers/dressing/bathroom        Hand Dominance        Extremity/Trunk Assessment   Upper Extremity Assessment Upper Extremity Assessment: Defer to OT evaluation    Lower Extremity Assessment Lower Extremity Assessment: Generalized weakness    Cervical / Trunk Assessment Cervical / Trunk Assessment: Kyphotic  Communication   Communication: No difficulties  Cognition Arousal/Alertness: Awake/alert Behavior During Therapy: WFL for tasks assessed/performed Overall Cognitive Status: History of cognitive impairments - at baseline                      General Comments      Exercises     Assessment/Plan    PT Assessment Patient needs continued PT services  PT Problem List Decreased strength;Decreased balance;Decreased activity tolerance;Decreased mobility;Decreased knowledge of use of DME;Decreased safety awareness;Decreased knowledge of precautions;Decreased cognition          PT Treatment Interventions DME instruction;Gait training;Stair training;Therapeutic activities;Therapeutic exercise;Patient/family education;Cognitive remediation    PT Goals (Current goals can be found in the Care Plan section)  Acute Rehab PT Goals Patient Stated Goal: none stated by pt PT Goal Formulation: With patient/family Time For Goal Achievement: 07/01/16 Potential to Achieve Goals: Good    Frequency Min 3X/week   Barriers to discharge        Co-evaluation                End of Session Equipment Utilized During Treatment: Gait belt Activity Tolerance: Patient tolerated treatment well Patient left: in chair;with call bell/phone within reach;with chair alarm set;with family/visitor present Nurse Communication: Mobility status         Time: 1045-1101 PT Time Calculation (min) (ACUTE ONLY): 16 min   Charges:   PT Evaluation $PT Eval Moderate Complexity: 1 Procedure     PT G Codes:        Laquashia Mergenthaler F Milus Fritze 07/08/2016, 12:18 PM  Gaye PollackRebecca Kim, SPT [phone]  I have read, reviewed and agree with student's note.   [facility] Patients Medical CenterDawn Gaynel Schaafsma,PT Acute Rehabilitation [phone] (pager)

## 2016-07-08 NOTE — Progress Notes (Signed)
   Subjective:  Patient reports pain as mild to moderate.  No c/o.  Objective:   VITALS:   Vitals:   07/07/16 1547 07/07/16 1600 07/07/16 2038 07/08/16 0614  BP: (!) 117/48  139/60 (!) 141/58  Pulse: 75  73 77  Resp: 15  16 16   Temp:  97.8 F (36.6 C) 98.4 F (36.9 C) 98 F (36.7 C)  TempSrc:   Oral Oral  SpO2: 98%  96% 94%   NAD ABD soft Sensation intact distally Intact pulses distally Dorsiflexion/Plantar flexion intact Incision: dressing C/D/I Compartment soft   Lab Results  Component Value Date   WBC 8.0 07/08/2016   HGB 12.0 07/08/2016   HCT 35.9 (L) 07/08/2016   MCV 90.2 07/08/2016   PLT 139 (L) 07/08/2016   BMET    Component Value Date/Time   NA 137 07/08/2016 0509   K 3.9 07/08/2016 0509   CL 103 07/08/2016 0509   CO2 28 07/08/2016 0509   GLUCOSE 165 (H) 07/08/2016 0509   BUN 12 07/08/2016 0509   CREATININE 0.73 07/08/2016 0509   CALCIUM 8.9 07/08/2016 0509   GFRNONAA >60 07/08/2016 0509   GFRAA >60 07/08/2016 0509     Assessment/Plan: 1 Day Post-Op   Principal Problem:   Closed displaced fracture of right femoral neck (HCC) Active Problems:   Diabetes mellitus (HCC)   Decubitus ulcer of right foot, stage 2   Decubitus ulcer of sacral region, stage 2   Dementia   Displaced fracture of right femoral neck (HCC)   WBAT with walker DVT ppx: lovenox in house - home on ASA, SCDs, TEDs PO pain control PT/OT D/C planning    Rawley Harju, Cloyde ReamsBrian James 07/08/2016, 1:52 PM   Samson FredericBrian Aariel Ems, MD Cell [phone]

## 2016-07-08 NOTE — Progress Notes (Signed)
   Subjective: Patient was evaluated this morning on rounds. She reports mild pain to her leg. She denies any shortness of breath or chest pain.  Objective:  Vital signs in last 24 hours: Vitals:   07/07/16 2038 07/08/16 0614 07/08/16 1215 07/08/16 1426  BP: 139/60 (!) 141/58 (!) 149/70   Pulse: 73 77 79   Resp: _0 Temp: 98.4 F (36.9 C) 98 F (36.7 C)  99.1 F (37.3 C)  TempSrc: Oral Oral  Oral  SpO2: 96% 94% 98%    Physical Exam  Constitutional: No distress.  Cardiovascular: Normal rate, regular rhythm and normal heart sounds.  Exam reveals no gallop and no friction rub.   No murmur heard. Pulmonary/Chest: Effort normal. No respiratory distress. She has no wheezes. She has no rales.  Abdominal: Soft. She exhibits no distension. There is no tenderness.  Musculoskeletal: She exhibits no edema.  Skin:  No ecchymosis or hematoma present at surgical site.     Assessment/Plan:  Principal Problem:   Closed displaced fracture of right femoral neck (HCC) Active Problems:   Diabetes mellitus ([place])   Decubitus ulcer of right foot, stage 2   Decubitus ulcer of sacral region, stage 2   Dementia   Displaced fracture of right femoral neck (HCC)  Femoral neck fracture  Patient has tramadol-acetaminophen available and was counseled on asking for pain medication when needed.  PT evaluated and recommended SNF.  Social worker met with family for possible SNF placement. Family has declined SNF placement and states they will take care of of the patient at home.  Family is agreeable with patient receiving home health PT.     -Miralax 17 g daily  -Tramadol- acetaminophel 37.5 mg q6h PRN  - OT - Home health face to face  Alzheimer's dementia  Patient has a history of alzheimer's dementia which her family feels is at baseline. She is at increased risk for hospital delirium in the setting of her hip fracture and immobilization.  - Continue Delirium precautions  -Ordered sitter at  bedside  -Foley catheter in place for incontinence   Type 2 Diabetes  Last HbA1c 05/2016 10.5. Home medication is novolog 70/30 18 U BID.  - continue home insulin regimen   Osteoporosis  Fragility fracture in this elder lady qualifies as a diagnosis of osteoporosis. Started on alendronate -alendronate 57m  Chronic nonhealing wounds  Patient's right heal is well bandaged. - may need wound care outpatient  Dispo: Anticipated discharge tomorrow   JHartly DO 07/08/2016, 4:23 PM Pager: [phone]

## 2016-07-08 NOTE — Clinical Social Work Note (Signed)
Per Pt's daughter family does not want to send pt to facility as it is "againts their culture." Pt's daughter provides 24/7 care for pt. Pt lives with ex husband, daughter, and daughters ex husband. Pt's daughter reports she checks pt's blood sugar and helps with all her health care needs. Pt's daughter is in agreeance to pt going home with family and pt receiving home health PT.   Clinical Social Worker will sign off for now as social work intervention is no longer needed. Please consult us again if new need arises.   [address]Sharon Butler, [place] [phone]

## 2016-07-08 NOTE — Progress Notes (Signed)
OT Cancellation Note  Patient Details Name: Sharon BeringRamona Delman MRN: [id number] DOB: 09/29/1928   Cancelled Treatment:    Reason Eval/Treat Not Completed: Other (comment): Attempted to see. Immediately prior to OT arrival, pt agitated and aggressive with nursing staff as they assisted. OT hearing screams from outside room. RN reports that pt likely upset that family has left for lunch and requesting OT return at later time when family is present for evaluation. OT will check back as able.  [address]Edda Orea A Zadia Uhde, OTR/L [phone] 07/08/2016, 12:58 PM

## 2016-07-08 NOTE — Telephone Encounter (Signed)
Patient's daughter Lucy ChrisDebroah called to let us know that Sharon Butler fell off her couch, broke her hip and has had hip replacement surgery. Her room number is 5N-19

## 2016-07-08 NOTE — [place] FL2 (Signed)
[place] MEDICAID FL2 LEVEL OF CARE SCREENING TOOL     IDENTIFICATION  Patient Name: Sharon Butler Birthdate: 09/29/1928 Sex: female Admission Date (Current Location): 07/05/2016  [facility] [place]   Facility and Address:  [facility] [place], [address], [place], [place] [address]      Provider Number: [id number]  Attending Physician Name and Address:  Earl LagosNischal Narendra, MD  Relative Name and Phone Number:       Current Level of Care: Hospital Recommended Level of Care: Skilled Nursing Facility Prior Approval Number:    Date Approved/Denied: 07/08/16 PASRR Number: [phone] [id number]   Discharge Plan: SNF    Current Diagnoses: Patient Active Problem List   Diagnosis Date Noted  . Displaced fracture of right femoral neck (HCC) 07/07/2016  . Closed displaced fracture of right femoral neck (HCC) 07/05/2016  . Dementia 07/05/2016  . Decubitus ulcer of right foot, stage 2   . Decubitus ulcer of sacral region, stage 2   . Diabetes mellitus (HCC) 06/15/2016    Orientation RESPIRATION BLADDER Height & Weight     Self  Normal Incontinent Weight:   Height:     BEHAVIORAL SYMPTOMS/MOOD NEUROLOGICAL BOWEL NUTRITION STATUS      Continent  (Please see discharge summary)  AMBULATORY STATUS COMMUNICATION OF NEEDS Skin   Extensive Assist Verbally PU Stage and Appropriate Care (Stage III: Right; Posterior and Sacrum. Closed incision right hip.)     PU Stage 3 Dressing: TID (Foam Dressing)                 Personal Care Assistance Level of Assistance  Bathing, Feeding, Dressing Bathing Assistance: Maximum assistance Feeding assistance: Limited assistance Dressing Assistance: Maximum assistance     Functional Limitations Info  Sight, Hearing, Speech Sight Info: Adequate Hearing Info: Adequate Speech Info: Adequate    SPECIAL CARE FACTORS FREQUENCY  PT (By licensed PT), OT (By licensed OT)     PT Frequency: 5x week OT Frequency: 5x  week            Contractures Contractures Info: Not present    Additional Factors Info  Code Status, Allergies Code Status Info: Full Allergies Info: Codeine, Other, Penicillins, Shellfish Allergy           Current Medications (07/08/2016):  This is the current hospital active medication list Current Facility-Administered Medications  Medication Dose Route Frequency Provider Last Rate Last Dose  . acetaminophen (TYLENOL) tablet 650 mg  650 mg Oral Q6H PRN Samson FredericBrian Swinteck, MD       Or  . acetaminophen (TYLENOL) suppository 650 mg  650 mg Rectal Q6H PRN Samson FredericBrian Swinteck, MD      . enoxaparin (LOVENOX) injection 30 mg  30 mg Subcutaneous Q24H Samson FredericBrian Swinteck, MD   30 mg at 07/08/16 0834  . feeding supplement (GLUCERNA SHAKE) (GLUCERNA SHAKE) liquid 237 mL  237 mL Oral BID BM Nischal Narendra, MD   237 mL at 07/08/16 0834  . insulin aspart (novoLOG) injection 0-9 Units  0-9 Units Subcutaneous TID WC Valentino NoseNathan Boswell, MD   2 Units at 07/08/16 [phone]  . insulin aspart protamine- aspart (NOVOLOG MIX 70/30) injection 18 Units  18 Units Subcutaneous BID WC Valentino NoseNathan Boswell, MD   18 Units at 07/08/16 [phone]  . menthol-cetylpyridinium (CEPACOL) lozenge 3 mg  1 lozenge Oral PRN Samson FredericBrian Swinteck, MD       Or  . phenol (CHLORASEPTIC) mouth spray 1 spray  1 spray Mouth/Throat PRN Samson FredericBrian Swinteck, MD      .  methocarbamol (ROBAXIN) tablet 500 mg  500 mg Oral Q6H PRN Samson FredericBrian Swinteck, MD       Or  . methocarbamol (ROBAXIN) 500 mg in dextrose 5 % 50 mL IVPB  500 mg Intravenous Q6H PRN Samson FredericBrian Swinteck, MD      . metoCLOPramide (REGLAN) tablet 5-10 mg  5-10 mg Oral Q8H PRN Samson FredericBrian Swinteck, MD       Or  . metoCLOPramide (REGLAN) injection 5-10 mg  5-10 mg Intravenous Q8H PRN Samson FredericBrian Swinteck, MD      . mupirocin cream (BACTROBAN) 2 %   Topical Daily Nischal Narendra, MD      . ondansetron (ZOFRAN) tablet 4 mg  4 mg Oral Q6H PRN Samson FredericBrian Swinteck, MD       Or  . ondansetron (ZOFRAN) injection 4 mg  4 mg Intravenous Q6H PRN  Samson FredericBrian Swinteck, MD      . polyethylene glycol (MIRALAX / GLYCOLAX) packet 17 g  17 g Oral Daily Beather Arbourushil V Patel, MD   17 g at 07/08/16 0835  . traMADol-acetaminophen (ULTRACET) 37.5-325 MG per tablet 1-2 tablet  1-2 tablet Oral Q4H PRN Samson FredericBrian Swinteck, MD   1 tablet at 07/07/16 1849     Discharge Medications: Please see discharge summary for a list of discharge medications.  Relevant Imaging Results:  Relevant Lab Results:   Additional Information SSN: [id number]  Volney AmericanBridget A Mayton, LCSW

## 2016-07-08 NOTE — Progress Notes (Signed)
Internal Medicine Attending  Date: 07/08/2016  Patient name: Fabio BeringRamona Mijangos Medical record number: [id number] Date of birth: 09/29/1928 Age: 82 y.o. Gender: female  I saw and evaluated the patient. I reviewed the resident's note by Dr. Glenard Haringatliff-Hoffman and I agree with the resident's findings and plans as documented in her progress note.  When seen on rounds this morning Ms. Solinger noted mild pain at the site of the surgery. Otherwise she was without any complaints. The family has apparently decided against skilled nursing facility placement and prefers home health physical therapy. We will therefore discharge her home tomorrow with home health physical therapy. We will follow the recommendations provided by orthopedic surgery regarding DVT prophylaxis.

## 2016-07-09 DIAGNOSIS — Z885 Allergy status to narcotic agent status: Secondary | ICD-10-CM

## 2016-07-09 DIAGNOSIS — E1165 Type 2 diabetes mellitus with hyperglycemia: Secondary | ICD-10-CM

## 2016-07-09 DIAGNOSIS — Z888 Allergy status to other drugs, medicaments and biological substances status: Secondary | ICD-10-CM

## 2016-07-09 DIAGNOSIS — Z880 Allergy status to penicillin: Secondary | ICD-10-CM

## 2016-07-09 DIAGNOSIS — Z91013 Allergy to seafood: Secondary | ICD-10-CM

## 2016-07-09 LAB — CBC
HCT: 33.5 % — ABNORMAL LOW (ref 36.0–46.0)
Hemoglobin: 11.4 g/dL — ABNORMAL LOW (ref 12.0–15.0)
MCH: 30.6 pg (ref 26.0–34.0)
MCHC: 34 g/dL (ref 30.0–36.0)
MCV: 90.1 fL (ref 78.0–100.0)
PLATELETS: 145 10*3/uL — AB (ref 150–400)
RBC: 3.72 MIL/uL — ABNORMAL LOW (ref 3.87–5.11)
RDW: 13.2 % (ref 11.5–15.5)
WBC: 7.4 10*3/uL (ref 4.0–10.5)

## 2016-07-09 LAB — BASIC METABOLIC PANEL
ANION GAP: 7 (ref 5–15)
BUN: 18 mg/dL (ref 6–20)
CALCIUM: 8.9 mg/dL (ref 8.9–10.3)
CO2: 27 mmol/L (ref 22–32)
CREATININE: 0.93 mg/dL (ref 0.44–1.00)
Chloride: 97 mmol/L — ABNORMAL LOW (ref 101–111)
GFR, EST NON AFRICAN AMERICAN: 54 mL/min — AB (ref >60)
Glucose, Bld: 349 mg/dL — ABNORMAL HIGH (ref 65–99)
Potassium: 4.1 mmol/L (ref 3.5–5.1)
SODIUM: 131 mmol/L — AB (ref 135–145)

## 2016-07-09 LAB — GLUCOSE, CAPILLARY
GLUCOSE-CAPILLARY: 379 mg/dL — AB (ref 65–99)
GLUCOSE-CAPILLARY: 351 mg/dL — AB (ref 65–99)
Glucose-Capillary: 215 mg/dL — ABNORMAL HIGH (ref 65–99)

## 2016-07-09 MED ORDER — MUPIROCIN 2 % EX OINT: 1.0000 "application " | TOPICAL_OINTMENT | Freq: Two times a day (BID) | CUTANEOUS | 0 refills | Status: DC

## 2016-07-09 MED ORDER — METHOCARBAMOL 500 MG PO TABS: 500.0000 mg | ORAL_TABLET | Freq: Four times a day (QID) | ORAL | 0 refills | Status: DC | PRN

## 2016-07-09 MED ORDER — TRAMADOL-ACETAMINOPHEN 37.5-325 MG PO TABS: 1.0000 | ORAL_TABLET | Freq: Four times a day (QID) | ORAL | 0 refills | Status: DC | PRN

## 2016-07-09 MED ORDER — MUPIROCIN CALCIUM 2 % EX CREA: TOPICAL_CREAM | Freq: Every day | CUTANEOUS | 0 refills | Status: DC

## 2016-07-09 MED ORDER — CALCIUM CARBONATE-VITAMIN D 500-200 MG-UNIT PO TABS
2.0000 | ORAL_TABLET | Freq: Every day | ORAL | Status: DC
  Administered 2016-07-09: 2 via ORAL
  Filled 2016-07-09: qty 1

## 2016-07-09 MED ORDER — CALCIUM CARBONATE-VITAMIN D 500-200 MG-UNIT PO TABS: 2.0000 | ORAL_TABLET | Freq: Every day | ORAL | 2 refills | Status: DC

## 2016-07-09 MED ORDER — ENOXAPARIN SODIUM 30 MG/0.3ML ~~LOC~~ SOLN: 30.0000 mg | SUBCUTANEOUS | 0 refills | Status: DC

## 2016-07-09 MED ORDER — POLYETHYLENE GLYCOL 3350 17 G PO PACK: 17.0000 g | PACK | Freq: Every day | ORAL | 0 refills | Status: DC

## 2016-07-09 MED ORDER — ALENDRONATE SODIUM 70 MG PO TABS: 70.0000 mg | ORAL_TABLET | ORAL | 0 refills | Status: DC

## 2016-07-09 NOTE — Progress Notes (Signed)
   Subjective:  Patient report some pain this morning but says that it is not too bad. No complaints today. Denies any shortness of breath or chest pain. Husband at bedside reports she is in her usual state of health today. Did have some confusion and mild agitation overnight, trying to get out of bed but otherwise no issues. Would like to get her home and out of the hospital as soon as possible. Requesting to have us speak with his daughter prior to any discharge.   Objective:  Vital signs in last 24 hours: Vitals:   07/08/16 1215 07/08/16 1426 07/08/16 1931 07/09/16 0344  BP: (!) 149/70  (!) 143/61 (!) 144/62  Pulse: 79  73 79  Resp: 16  18 18   Temp:  99.1 F (37.3 C) 98.2 F (36.8 C) 97.6 F (36.4 C)  TempSrc:  Oral Oral Oral  SpO2: 98%  95% 90%   Physical Exam  Constitutional: No distress.  Thin, frail elderly woman sitting up in bed eating breakfast.  Cardiovascular: Normal rate, regular rhythm and normal heart sounds.  Exam reveals no gallop and no friction rub.   No murmur heard. Pulmonary/Chest: Effort normal. No respiratory distress. She has no wheezes. She has no rales.  Abdominal: Soft. She exhibits no distension. There is no tenderness.  Musculoskeletal: She exhibits no edema.  2+ pedal pulses, intact motor function  Skin:  No ecchymosis or hematoma present at surgical site.   Assessment/Plan:  Femoral neck fracture  S/p right hip hemiarthroplasty on 12/14. Patient has tramadol-acetaminophen available and was counseled on asking for pain medication when needed. PT recommended SNF but family declined and [facility]. Case manager consulted. Ortho recommending home with ASA, SCDs and TED hose. Hemoglobin stable since surgery.  Will need follow up in clinic and with ortho following discharge.  -Miralax 17 g daily  -Tramadol- acetaminophel 37.5 mg q6h PRN  - PT/OT - Appreciate ortho recommendations - Appreciate case manager assistance    Alzheimer's dementia  Patient has a history of alzheimer's dementia which her family feels is at baseline. She is at increased risk for hospital delirium in the setting of her hip fracture and immobilization.  -Continue Delirium precautions  -Sitter at bedside  -D/C foley catheter   Type 2 Diabetes  Last HbA1c 05/2016 10.5. Home medication is novolog 70/30 18 U BID. CBGs have been labile. Ranged form 165-230 yesterday. Up to 349 this morning. Will need further titration outpatient.  - continue home insulin regimen  - SSI-s  Osteoporosis  Fragility fracture in this elder lady qualifies as a diagnosis of osteoporosis. Started on alendronate 12/15.  -alendronate 70mg   Chronic nonhealing wounds  Patient's right heal is well bandaged. - may need wound care outpatient  Dispo: Anticipated discharge today  Valentino NoseNathan Talbert Trembath, MD 07/09/2016, 7:54 AM Pager: [phone]

## 2016-07-09 NOTE — Plan of Care (Signed)
Problem: Pain Managment: Goal: General experience of comfort will improve Outcome: Progressing Medicated once for pain with full relief  Problem: Bowel/Gastric: Goal: Will not experience complications related to bowel motility Outcome: Progressing No complications noted  Problem: Physical Regulation: Goal: Will remain free from infection Outcome: Progressing No s/s of infection, VS WNL Goal: Postoperative complications will be avoided or minimized Outcome: Progressing No post op complications noted

## 2016-07-09 NOTE — Care Management Note (Addendum)
Case Management Note  Patient Details  Name: Sharon [name] MRN: [id number] Date of Birth: 09/29/1928  Subjective/Objective:   Femoral Neck Fracture,   Right Hip Arthroplasty               Action/Plan: Discharge Planning: AVS reviewed:  Chart reviewed. Declining SNF rehab. NCM spoke to pt's husband and [facility]. Contacted [facility] Liaison for HHPT and RW for home.  Pt has insurance for Lovenox. If she has to pay out of pocket, [facility] will [facility]. They need her insurance info before they can give price on meds. Family to take insurance card to pharmacy.   PCP Noemi ChapelMOLT, BETHANY MD   Expected Discharge Date:  07/09/2016               Expected Discharge Plan:  Home w Home Health Services  In-House Referral:  NA  Discharge planning Services  CM Consult  Post Acute Care Choice:  Home Health Choice offered to:  Adult Children  DME Arranged:  [place] rolling DME Agency:  [facility]  HH Arranged:  PT HH Agency:  [facility]  Status of Service:  Completed, signed off  If discussed at Long Length of Stay Meetings, dates discussed:    Additional Comments:  Elliot CousinShavis, Keefer Soulliere Ellen, RN 07/09/2016, 2:53 PM

## 2016-07-09 NOTE — Progress Notes (Signed)
Physical Therapy Treatment Patient Details Name: Sharon BeringRamona Magowan MRN: [id number] DOB: 09/29/1928 Today's Date: 07/09/2016    History of Present Illness 82 yo F who presents with closed displaced fracture of right femur. s/p R hip replacement. Pt with a significant PMH of T2DM, currently on insulin, and Alzheimer's that presented to the ED with hyperglycemia, UTI, ongoing pressure ulcer issues.    PT Comments    Pt presents with improved cooperation, but is very limited with mobility. Pt's mobility is not different from baseline per spouse who has been caring for her for many years. Family still decided on HHPT vs SNF placement at this time. Spouse feels he can handle client with assistance once returning home. This visit was performed in conjunction with OT and mainly focused on sit to stand transfers to change depends and standing tolerance. Pt with significant posterior lean which limits ability to stand without assistance.    Follow Up Recommendations  Home health PT     Equipment Recommendations  None recommended by PT    Recommendations for Other Services       Precautions / Restrictions Precautions Precautions: Fall Precaution Comments: h/o falls PTA, uses rollator Restrictions Weight Bearing Restrictions: Yes RLE Weight Bearing: Weight bearing as tolerated    Mobility  Bed Mobility               General bed mobility comments: revieved in recliner  Transfers Overall transfer level: Needs assistance Equipment used: Rolling walker (2 wheeled) Transfers: Sit to/from Stand Sit to Stand: +2 physical assistance;Mod assist;+2 safety/equipment         General transfer comment: pt conitnues to have posterior lean with standing. Requires Mod-Max A to maintain standing even at RW to change depend andt transfer back to chair  Ambulation/Gait                 Stairs            Wheelchair Mobility    Modified Rankin (Stroke Patients Only)        Balance Overall balance assessment: Needs assistance Sitting-balance support: Feet supported;No upper extremity supported;Bilateral upper extremity supported;Single extremity supported Sitting balance-Leahy Scale: Good   Postural control: Posterior lean Standing balance support: Bilateral upper extremity supported Standing balance-Leahy Scale: Poor Standing balance comment: Pt requires B UE support and +2 mod assist to maintain standing balance 2/2 significant posteriorlean                    Cognition Arousal/Alertness: Awake/alert Behavior During Therapy: WFL for tasks assessed/performed Overall Cognitive Status: History of cognitive impairments - at baseline                      Exercises Total Joint Exercises Long Arc QuadBarbaraann Boys: AAROM;Right;10 reps;Seated    General Comments        Pertinent Vitals/Pain Pain Assessment: Faces Faces Pain Scale: Hurts little more Pain Location: right hip Pain Descriptors / Indicators: Grimacing;Guarding Pain Intervention(s): Monitored during session;Repositioned    Home Living                      Prior Function            PT Goals (current goals can now be found in the care plan section) Acute Rehab PT Goals Patient Stated Goal: none stated by pt Progress towards PT goals: Progressing toward goals    Frequency    Min 3X/week      PT Plan  Current plan remains appropriate    Co-evaluation PT/OT/SLP Co-Evaluation/Treatment: Yes Reason for Co-Treatment: Complexity of the patient's impairments (multi-system involvement);For patient/therapist safety PT goals addressed during session: Mobility/safety with mobility       End of Session Equipment Utilized During Treatment: Gait belt Activity Tolerance: Patient tolerated treatment well Patient left: in chair;with call bell/phone within reach;with chair alarm set;with family/visitor present     Time: [id number] PT Time Calculation (min) (ACUTE ONLY): 25  min  Charges:  $Therapeutic Activity: 8-22 mins                    G Codes:      Colin BroachSabra M. Christalynn Boise PT, DPT  [phone]  07/09/2016, 1:46 PM

## 2016-07-09 NOTE — Discharge Instructions (Signed)
°Dr. Brian Swinteck °Joint Replacement Specialist °[facility] °[address] °[place], [place] [address] °[phone] ° ° °TOTAL HIP REPLACEMENT POSTOPERATIVE DIRECTIONS ° ° ° °Hip Rehabilitation, Guidelines Following Surgery  ° °WEIGHT BEARING °Weight bearing as tolerated with assist device (walker, cane, etc) as directed, use it as long as suggested by your surgeon or therapist, typically at least 4-6 weeks. ° °The results of a hip operation are greatly improved after range of motion and muscle strengthening exercises. Follow all safety measures which are given to protect your hip. If any of these exercises cause increased pain or swelling in your joint, decrease the amount until you are comfortable again. Then slowly increase the exercises. Call your caregiver if you have problems or questions.  ° °HOME CARE INSTRUCTIONS  °Most of the following instructions are designed to prevent the dislocation of your new hip.  °Remove items at home which could result in a fall. This includes throw rugs or furniture in walking pathways.  °Continue medications as instructed at time of discharge. °· You may have some home medications which will be placed on hold until you complete the course of blood thinner medication. °· You may start showering once you are discharged home. Do not remove your dressing. °Do not put on socks or shoes without following the instructions of your caregivers.   °Sit on chairs with arms. Use the chair arms to help push yourself up when arising.  °Arrange for the use of a toilet seat elevator so you are not sitting low.  °· Walk with walker as instructed.  °You may resume a sexual relationship in one month or when given the OK by your caregiver.  °Use walker as long as suggested by your caregivers.  °You may put full weight on your legs and walk as much as is comfortable. °Avoid periods of inactivity such as sitting longer than an hour when not asleep. This helps prevent  blood clots.  °You may return to work once you are cleared by your surgeon.  °Do not drive a car for 6 weeks or until released by your surgeon.  °Do not drive while taking narcotics.  °Wear elastic stockings for two weeks following surgery during the day but you may remove then at night.  °Make sure you keep all of your appointments after your operation with all of your doctors and caregivers. You should call the office at the above phone number and make an appointment for approximately two weeks after the date of your surgery. °Please pick up a stool softener and laxative for home use as long as you are requiring pain medications. °· ICE to the affected hip every three hours for 30 minutes at a time and then as needed for pain and swelling. Continue to use ice on the hip for pain and swelling from surgery. You may notice swelling that will progress down to the foot and ankle.  This is normal after surgery.  Elevate the leg when you are not up walking on it.   °It is important for you to complete the blood thinner medication as prescribed by your doctor. °· Continue to use the breathing machine which will help keep your temperature down.  It is common for your temperature to cycle up and down following surgery, especially at night when you are not up moving around and exerting yourself.  The breathing machine keeps your lungs expanded and your temperature down. ° °RANGE OF MOTION AND STRENGTHENING EXERCISES  °These exercises are   designed to help you keep full movement of your hip joint. Follow your caregiver's or physical therapist's instructions. Perform all exercises about fifteen times, three times per day or as directed. Exercise both hips, even if you have had only one joint replacement. These exercises can be done on a training (exercise) mat, on the floor, on a table or on a bed. Use whatever works the best and is most comfortable for you. Use music or television while you are exercising so that the exercises  are a pleasant break in your day. This will make your life better with the exercises acting as a break in routine you can look forward to.  °Lying on your back, slowly slide your foot toward your buttocks, raising your knee up off the floor. Then slowly slide your foot back down until your leg is straight again.  °Lying on your back spread your legs as far apart as you can without causing discomfort.  °Lying on your side, raise your upper leg and foot straight up from the floor as far as is comfortable. Slowly lower the leg and repeat.  °Lying on your back, tighten up the muscle in the front of your thigh (quadriceps muscles). You can do this by keeping your leg straight and trying to raise your heel off the floor. This helps strengthen the largest muscle supporting your knee.  °Lying on your back, tighten up the muscles of your buttocks both with the legs straight and with the knee bent at a comfortable angle while keeping your heel on the floor.  ° °SKILLED REHAB INSTRUCTIONS: °If the patient is transferred to a skilled rehab facility following release from the hospital, a list of the current medications will be sent to the facility for the patient to continue.  When discharged from the skilled rehab facility, please have the facility set up the patient's Home Health Physical Therapy prior to being released. Also, the skilled facility will be responsible for providing the patient with their medications at time of release from the facility to include their pain medication and their blood thinner medication. If the patient is still at the rehab facility at time of the two week follow up appointment, the skilled rehab facility will also need to assist the patient in arranging follow up appointment in our office and any transportation needs. ° °MAKE SURE YOU:  °Understand these instructions.  °Will watch your condition.  °Will get help right away if you are not doing well or get worse. ° °Pick up stool softner and  laxative for home use following surgery while on pain medications. °Do not remove your dressing. °The dressing is waterproof--it is OK to take showers. °Continue to use ice for pain and swelling after surgery. °Do not use any lotions or creams on the incision until instructed by your surgeon. °Total Hip Protocol. ° ° °

## 2016-07-09 NOTE — Progress Notes (Signed)
Internal Medicine Attending  Date: 07/09/2016  Patient name: Sharon Butler Medical record number: [id number] Date of birth: 09/29/1928 Age: 82 y.o. Gender: female  I saw and evaluated the patient. I reviewed the resident's note by Dr. Karma GreaserBoswell and I agree with the resident's findings and plans as documented in his progress note.  When seen on rounds this morning she had no complaints including denying any pain. The family has decided on taking her home with home physical therapy. We are currently trying to arrange this as well as her DVT prophylaxis. If this can be arranged today she will be discharged home today.

## 2016-07-09 NOTE — Progress Notes (Signed)
Orthopedics Progress Note  Subjective: Confused this morning  Objective:  Vitals:   07/08/16 1931 07/09/16 0344  BP: (!) 143/61 (!) 144/62  Pulse: 73 79  Resp: 18 18  Temp: 98.2 F (36.8 C) 97.6 F (36.4 C)    General: Awake and alert  Musculoskeletal: right hip incision benign with Silver dressing in place, no cords and no leg swelling, NVI distally Neurovascularly intact  Lab Results  Component Value Date   WBC 7.4 07/09/2016   HGB 11.4 (L) 07/09/2016   HCT 33.5 (L) 07/09/2016   MCV 90.1 07/09/2016   PLT 145 (L) 07/09/2016       Component Value Date/Time   NA 131 (L) 07/09/2016 0450   K 4.1 07/09/2016 0450   CL 97 (L) 07/09/2016 0450   CO2 27 07/09/2016 0450   GLUCOSE 349 (H) 07/09/2016 0450   BUN 18 07/09/2016 0450   CREATININE 0.93 07/09/2016 0450   CALCIUM 8.9 07/09/2016 0450   GFRNONAA 54 (L) 07/09/2016 0450   GFRAA >60 07/09/2016 0450    Lab Results  Component Value Date   INR 0.94 07/05/2016    Assessment/Plan: POD #3 s/p Procedure(s): ANTERIOR APPROACH HEMI HIP ARTHROPLASTY D/C planning DVT prophylaxis - Lovenox and mechanical Follow up with Dr Linna CapriceSwinteck in two weeks in the office  Viviann SpareSteven R. Ranell PatrickNorris, MD 07/09/2016 9:17 AM

## 2016-07-09 NOTE — Evaluation (Signed)
Occupational Therapy Evaluation Patient Details Name: Fabio BeringRamona Haddon MRN: [id number] DOB: 09/29/1928 Today's Date: 07/09/2016    History of Present Illness 82 yo F who presents with closed displaced fracture of right femur. s/p R hip replacement. Pt with a significant PMH of T2DM, currently on insulin, and Alzheimer's that presented to the ED with hyperglycemia, UTI, ongoing pressure ulcer issues.   Clinical Impression   PTA Pt performed grooming seated, and was getting assistance from husband and daughter in ADL, dependent in IADL. Pt ambulating with RW. Pt currently max assist for ADL and +2 for sit to stand. Please see performance below. Upon consulting with RN prior to session, Pt has history of kicking, hitting, and biting (due to Alzheimer's). But Pt very pleasant and willing to work with therapy this session. Pt will benefit from skilled OT in the acute care setting prior to d/c home with HHOT to maximize independence in ADL and functional transfers and also for caregiver burden and education.      Follow Up Recommendations  Supervision/Assistance - 24 hour;Home health OT    Equipment Recommendations  None recommended by OT    Recommendations for Other Services       Precautions / Restrictions Precautions Precautions: Fall Precaution Comments: h/o falls PTA, uses rollator Restrictions Weight Bearing Restrictions: Yes RLE Weight Bearing: Weight bearing as tolerated      Mobility Bed Mobility               General bed mobility comments: revieved in recliner  Transfers Overall transfer level: Needs assistance Equipment used: Rolling walker (2 wheeled) Transfers: Sit to/from Stand Sit to Stand: +2 physical assistance;Mod assist;+2 safety/equipment         General transfer comment: pt conitnues to have posterior lean with standing. Requires Mod-Max A to maintain standing even at RW to change depend andt transfer back to chair    Balance Overall balance  assessment: Needs assistance Sitting-balance support: Feet supported;No upper extremity supported;Bilateral upper extremity supported;Single extremity supported Sitting balance-Leahy Scale: Good   Postural control: Posterior lean Standing balance support: Bilateral upper extremity supported Standing balance-Leahy Scale: Poor Standing balance comment: Pt requires B UE support and +2 mod assist to maintain standing balance 2/2 significant posteriorlean                            ADL Overall ADL's : Needs assistance/impaired Eating/Feeding: Set up;Sitting   Grooming: Sitting;Minimal assistance;Wash/dry face;Oral care;Set up Grooming Details (indicate cue type and reason): wash face and oral care with tactile and verbal cues Upper Body Bathing: Minimal assistance;Sitting   Lower Body Bathing: Maximal assistance;Sitting/lateral leans   Upper Body Dressing : Min guard;Sitting Upper Body Dressing Details (indicate cue type and reason): to don gown Lower Body Dressing: Total assistance;Sit to/from stand Lower Body Dressing Details (indicate cue type and reason): don/doff depends             Functional mobility during ADLs:  (no mobility this session, sit <> stand x 2 only) General ADL Comments: Per husband, Pt is pretty much at baseline, and is extremely tired this session from lack of sleep during the night     Vision     Perception     Praxis      Pertinent Vitals/Pain Pain Assessment: Faces Faces Pain Scale: Hurts little more Pain Location: right hip Pain Descriptors / Indicators: Grimacing;Guarding Pain Intervention(s): Monitored during session;Repositioned     Hand Dominance  Extremity/Trunk Assessment Upper Extremity Assessment Upper Extremity Assessment: Generalized weakness   Lower Extremity Assessment Lower Extremity Assessment: RLE deficits/detail RLE Deficits / Details: decreased strength and ROM post-op   Cervical / Trunk  Assessment Cervical / Trunk Assessment: Kyphotic   Communication Communication Communication: Other (comment) (delayed response, bilingual, so slow processing sometimes)   Cognition Arousal/Alertness: Awake/alert Behavior During Therapy: WFL for tasks assessed/performed Overall Cognitive Status: History of cognitive impairments - at baseline                     General Comments       Exercises      Shoulder Instructions      Home Living Family/patient expects to be discharged to:: Private residence Living Arrangements: Children;Spouse/significant other Available Help at Discharge: Family;Available 24 hours/day Type of Home: Mobile home Home Access: Stairs to enter Entrance Stairs-Number of Steps: 3 Entrance Stairs-Rails: None Home Layout: One level     Bathroom Shower/Tub: Producer, television/film/videoWalk-in shower   Bathroom Toilet: Standard     Home Equipment: Environmental consultantWalker - 4 wheels;Cane - single point;Bedside commode;Shower seat;Wheelchair - manual   Additional Comments: History given by husband. Husband confirms that they have all the equipment they need      Prior Functioning/Environment Level of Independence: Needs assistance  Gait / Transfers Assistance Needed: Uses rollator for mobility. Husband/daughter would typically transfer her to wheelchair/BSC/rollator ADL's / Homemaking Assistance Needed: Husband/daughter assists 24/7 with transfers/dressing/bathroom            OT Problem List: Decreased strength;Decreased range of motion;Decreased activity tolerance;Impaired balance (sitting and/or standing);Decreased knowledge of use of DME or AE;Decreased knowledge of precautions;Pain   OT Treatment/Interventions: Self-care/ADL training;Energy conservation;DME and/or AE instruction;Therapeutic activities;Patient/family education    OT Goals(Current goals can be found in the care plan section) Acute Rehab OT Goals Patient Stated Goal: none stated by pt OT Goal Formulation: With  patient/family Time For Goal Achievement: 07/16/16 Potential to Achieve Goals: Good ADL Goals Pt Will Perform Grooming: sitting;with supervision Pt Will Transfer to Toilet: with mod assist;stand pivot transfer;bedside commode Pt Will Perform Toileting - Clothing Manipulation and hygiene: with supervision;sit to/from stand  OT Frequency: Min 2X/week   Barriers to D/C:            Co-evaluation   Reason for Co-Treatment: Complexity of the patient's impairments (multi-system involvement);For patient/therapist safety PT goals addressed during session: Mobility/safety with mobility        End of Session Equipment Utilized During Treatment: Gait belt;Rolling walker Nurse Communication: Mobility status  Activity Tolerance: Patient tolerated treatment well Patient left: in chair;with call bell/phone within reach;with chair alarm set;with family/visitor present   Time: [id number] OT Time Calculation (min): 25 min Charges:  OT General Charges $OT Visit: 1 Procedure OT Evaluation $OT Eval Moderate Complexity: 1 Procedure G-Codes:    Evern BioLaura J Sandee Bernath 07/09/2016, 2:50 PM  Sherryl MangesLaura Teyonna Plaisted OTR/L [phone]

## 2016-07-09 NOTE — Discharge Summary (Signed)
Name: Sharon Butler MRN: [id number] DOB: 09/29/1928 82 y.o. PCP: Noemi ChapelBethany Molt, DO  Date of Admission: 07/05/2016  8:11 PM Date of Discharge: 07/09/2016 Attending Physician: Doneen PoissonLawrence Klima, MD  Discharge Diagnosis: Principal Problem:   Closed displaced fracture of right femoral neck (HCC) Active Problems:   Diabetes mellitus (HCC)   Decubitus ulcer of right foot, stage 2   Decubitus ulcer of sacral region, stage 2   Dementia   Displaced fracture of right femoral neck (HCC)   Poorly controlled type 2 diabetes mellitus (HCC)   Discharge Medications: Allergies as of 07/09/2016      Reactions   Codeine Hives, Itching   Hives itching   Other Other (See Comments)   Anesthesia-trouble waking up   Penicillins Other (See Comments)   Anaphylactic shock Has patient had a PCN reaction causing immediate rash, facial/tongue/throat swelling, SOB or lightheadedness with hypotension:YES Has patient had a PCN reaction causing severe rash involving mucus membranes or skin necrosis: NO Has patient had a PCN reaction that required hospitalization NO Has patient had a PCN reaction occurring within the last 10 years:NO If all of the above answers are "NO", then may proceed with Cephalosporin use.   Shellfish Allergy Hives   hives      Medication List    STOP taking these medications   nitrofurantoin (macrocrystal-monohydrate) 100 MG capsule Commonly known as:  MACROBID     TAKE these medications   alendronate 70 MG tablet Commonly known as:  FOSAMAX Take 1 tablet (70 mg total) by mouth once a week. Take with a full glass of water on an empty stomach.   calcium-vitamin D 500-200 MG-UNIT tablet Commonly known as:  OSCAL WITH D Take 2 tablets by mouth daily.   CINNAMON PO Take 2 tablets by mouth 2 (two) times daily.   enoxaparin 30 MG/0.3ML injection Commonly known as:  LOVENOX Inject 0.3 mLs (30 mg total) into the skin daily.   feeding supplement (GLUCERNA SHAKE) Liqd Take 237  mLs by mouth 2 (two) times daily between meals.   ibuprofen 200 MG tablet Commonly known as:  ADVIL,MOTRIN Take 200-400 mg by mouth every 6 (six) hours as needed for headache or moderate pain.   insulin aspart protamine- aspart (70-30) 100 UNIT/ML injection Commonly known as:  NOVOLOG MIX 70/30 Inject 0.18 mLs (18 Units total) into the skin 2 (two) times daily with a meal.   methocarbamol 500 MG tablet Commonly known as:  ROBAXIN Take 1 tablet (500 mg total) by mouth every 6 (six) hours as needed for muscle spasms.   mupirocin ointment 2 % Commonly known as:  BACTROBAN Place 1 application into the nose 2 (two) times daily. For 5 days   OMEGA 3-6-9 COMPLEX Caps Take 1 capsule by mouth daily.   polyethylene glycol packet Commonly known as:  MIRALAX / GLYCOLAX Take 17 g by mouth daily.   simvastatin 10 MG tablet Commonly known as:  ZOCOR Take 10 mg by mouth daily.   traMADol-acetaminophen 37.5-325 MG tablet Commonly known as:  ULTRACET Take 1-2 tablets by mouth every 6 (six) hours as needed for moderate pain or severe pain.   VITA-C PO Take 1 tablet by mouth daily as needed (for immune).       Disposition and follow-up:   [facility].  At the hospital follow up visit please address:  1.  Right hip fracture: s/p right hemiarthroplasty on 12/14. Discharged with home health PT.  Started on alendronate for fragility fracture.  2.  Labs / imaging needed at time of follow-up: none  3.  Pending labs/ test needing follow-up: none  Follow-up Appointments: Follow-up Information    Swinteck, Cloyde ReamsBrian James, MD. Schedule an appointment as soon as possible for a visit in 2 week(s).   Specialty:  Orthopedic Surgery Why:  For wound re-check Contact information: [address] [place] [address] [phone]        Sharon Molt, DO. Schedule an appointment as soon as possible for a visit  in 2 week(s).   Specialty:  Internal Medicine Contact information: [address] [place] [address] [phone]        [facility] Follow up.   Why:  Home Health Physical Therapy Contact information: [address] [place] [address] [phone]        [facility] Follow up.   Contact information: [address] [place] [address] [phone]           Hospital Course by problem list:   1. Acute right hip fracture: Patient is an 82 year old female who presented with a fall from her couch and was found to have an acute right hip fracture which is non-dislocated. Patient to be taken for right hip hemiarthroplasty on 12/14. Physical therapy recommended skilled nursing facility placement at discharge; however, family declined and preferred to take her home with home healthy physical therapy. Given that this was a fall from sitting height it is likely the patient has osteoporosis and was started on vitamin D and alendronate prior to discharge. Has follow up with orthopedic surgery in 2 weeks. Discharged on lovenox until follow up.   2. Uncontrolled diabetes: Resumed her home dose NovoLog 70/30 which is 18 units twice a day with good control of her blood sugars.   Discharge Vitals:   BP (!) 144/62 (BP Location: Left Arm)   Pulse 79   Temp 97.6 F (36.4 C) (Oral)   Resp 18   SpO2 90%   Pertinent Labs, Studies, and Procedures:  DG HIP (WITH OR WITHOUT PELVIS) 2-3V RIGHT  COMPARISON:  None.  FINDINGS: Acute RIGHT femoral neck fracture with impaction, fracture fragments in alignment. Femoral head is located. Osteopenia without destructive bony lesions. Severe vascular calcifications.  IMPRESSION: Acute RIGHT femoral neck fracture without dislocation.   Discharge Instructions: Discharge Instructions    Call MD for:  redness, tenderness, or signs of infection (pain, swelling, redness, odor or  green/yellow discharge around incision site)    Complete by:  As directed    Call MD for:  severe uncontrolled pain    Complete by:  As directed    Call MD for:  temperature >100.4    Complete by:  As directed    Diet - low sodium heart healthy    Complete by:  As directed    Increase activity slowly    Complete by:  As directed    Place and maintain sequential compression device    Complete by:  As directed       Signed: Valentino NoseNathan Macarthur Lorusso, MD 07/11/2016, 10:00 PM   Pager: [phone]

## 2016-07-09 NOTE — Progress Notes (Signed)
Reviewed discharge instructions/medications with patient and husband.  Answered their questions.  Talked to the case manager about their Xarelto and if they can get help to pay for it.  Patient is waiting for daughter to pick them up.

## 2016-07-11 LAB — GLUCOSE, CAPILLARY
GLUCOSE-CAPILLARY: 314 mg/dL — AB (ref 65–99)
Glucose-Capillary: 313 mg/dL — ABNORMAL HIGH (ref 65–99)

## 2016-07-14 ENCOUNTER — Telehealth: Payer: Self-pay | Admitting: *Deleted

## 2016-07-14 NOTE — Telephone Encounter (Signed)
Received call from pt's daugher Eunice BlaseDebbie Rena-states patient was d/c from hospital on 07/09/16  s/p fall and closed displaced fracture of right femoral neck.  Per pt's electronic medical record, she was to received home health PT.

## 2016-07-14 NOTE — Telephone Encounter (Signed)
Chart review, [facility] with [facility] management prior to hospital discharge.  [facility] do not have [facility] on file.  CSW placed call to case management.  Case Manager, Mena PaulsS. Brady to follow [facility] with [facility].  Mena PaulsS. Brady aware to [facility] should add'l agency require add'l information for services.

## 2016-07-25 DIAGNOSIS — T8149XA Infection following a procedure, other surgical site, initial encounter: Secondary | ICD-10-CM

## 2016-07-25 HISTORY — DX: Infection following a procedure, other surgical site, initial encounter: T81.49XA

## 2016-07-26 ENCOUNTER — Telehealth: Payer: Self-pay | Admitting: Internal Medicine

## 2016-07-26 DIAGNOSIS — S72001D Fracture of unspecified part of neck of right femur, subsequent encounter for closed fracture with routine healing: Secondary | ICD-10-CM | POA: Diagnosis not present

## 2016-07-26 NOTE — Telephone Encounter (Signed)
APT. REMINDER CALL, LMTCB °

## 2016-07-27 ENCOUNTER — Encounter: Payer: Self-pay | Admitting: Internal Medicine

## 2016-07-27 ENCOUNTER — Ambulatory Visit: Payer: Medicare (Managed Care)

## 2016-07-27 ENCOUNTER — Telehealth: Payer: Self-pay | Admitting: Dietician

## 2016-07-27 ENCOUNTER — Encounter: Payer: Medicare (Managed Care) | Admitting: Dietician

## 2016-07-27 NOTE — Telephone Encounter (Signed)
Left message for daughter offering assistance with her mother's care as needed.

## 2016-07-28 ENCOUNTER — Encounter (HOSPITAL_BASED_OUTPATIENT_CLINIC_OR_DEPARTMENT_OTHER): Payer: Medicare (Managed Care) | Attending: Internal Medicine

## 2016-08-02 NOTE — Addendum Note (Signed)
Addended by: Neomia DearPOWERS, Tamela Elsayed E on: 08/02/2016 07:13 PM   Modules accepted: Orders

## 2016-08-03 ENCOUNTER — Inpatient Hospital Stay (HOSPITAL_COMMUNITY)
Admission: EM | Admit: 2016-08-03 | Discharge: 2016-08-12 | DRG: 856 | Disposition: A | Discharge status: Home / Self Care | Payer: Medicare HMO | Attending: Internal Medicine | Admitting: Internal Medicine

## 2016-08-03 ENCOUNTER — Encounter (HOSPITAL_COMMUNITY): Payer: Self-pay | Admitting: *Deleted

## 2016-08-03 DIAGNOSIS — E11649 Type 2 diabetes mellitus with hypoglycemia without coma: Secondary | ICD-10-CM | POA: Diagnosis not present

## 2016-08-03 DIAGNOSIS — Z8614 Personal history of Methicillin resistant Staphylococcus aureus infection: Secondary | ICD-10-CM | POA: Diagnosis not present

## 2016-08-03 DIAGNOSIS — E1165 Type 2 diabetes mellitus with hyperglycemia: Secondary | ICD-10-CM | POA: Diagnosis present

## 2016-08-03 DIAGNOSIS — L89153 Pressure ulcer of sacral region, stage 3: Secondary | ICD-10-CM | POA: Diagnosis present

## 2016-08-03 DIAGNOSIS — R262 Difficulty in walking, not elsewhere classified: Secondary | ICD-10-CM | POA: Diagnosis not present

## 2016-08-03 DIAGNOSIS — E860 Dehydration: Secondary | ICD-10-CM | POA: Diagnosis present

## 2016-08-03 DIAGNOSIS — K5900 Constipation, unspecified: Secondary | ICD-10-CM | POA: Diagnosis not present

## 2016-08-03 DIAGNOSIS — Z961 Presence of intraocular lens: Secondary | ICD-10-CM | POA: Diagnosis present

## 2016-08-03 DIAGNOSIS — T8459XA Infection and inflammatory reaction due to other internal joint prosthesis, initial encounter: Secondary | ICD-10-CM

## 2016-08-03 DIAGNOSIS — L89610 Pressure ulcer of right heel, unstageable: Secondary | ICD-10-CM | POA: Diagnosis present

## 2016-08-03 DIAGNOSIS — Z9842 Cataract extraction status, left eye: Secondary | ICD-10-CM | POA: Diagnosis not present

## 2016-08-03 DIAGNOSIS — T8132XD Disruption of internal operation (surgical) wound, not elsewhere classified, subsequent encounter: Secondary | ICD-10-CM | POA: Diagnosis not present

## 2016-08-03 DIAGNOSIS — N764 Abscess of vulva: Secondary | ICD-10-CM

## 2016-08-03 DIAGNOSIS — Z880 Allergy status to penicillin: Secondary | ICD-10-CM | POA: Diagnosis not present

## 2016-08-03 DIAGNOSIS — Z794 Long term (current) use of insulin: Secondary | ICD-10-CM | POA: Diagnosis not present

## 2016-08-03 DIAGNOSIS — Y792 Prosthetic and other implants, materials and accessory orthopedic devices associated with adverse incidents: Secondary | ICD-10-CM | POA: Diagnosis not present

## 2016-08-03 DIAGNOSIS — M25559 Pain in unspecified hip: Secondary | ICD-10-CM | POA: Diagnosis not present

## 2016-08-03 DIAGNOSIS — T8451XA Infection and inflammatory reaction due to internal right hip prosthesis, initial encounter: Secondary | ICD-10-CM | POA: Diagnosis not present

## 2016-08-03 DIAGNOSIS — Z79899 Other long term (current) drug therapy: Secondary | ICD-10-CM | POA: Diagnosis not present

## 2016-08-03 DIAGNOSIS — T8149XA Infection following a procedure, other surgical site, initial encounter: Secondary | ICD-10-CM | POA: Diagnosis present

## 2016-08-03 DIAGNOSIS — E7800 Pure hypercholesterolemia, unspecified: Secondary | ICD-10-CM | POA: Diagnosis present

## 2016-08-03 DIAGNOSIS — T814XXD Infection following a procedure, subsequent encounter: Secondary | ICD-10-CM | POA: Diagnosis not present

## 2016-08-03 DIAGNOSIS — G309 Alzheimer's disease, unspecified: Secondary | ICD-10-CM | POA: Diagnosis present

## 2016-08-03 DIAGNOSIS — B999 Unspecified infectious disease: Secondary | ICD-10-CM | POA: Diagnosis not present

## 2016-08-03 DIAGNOSIS — S71001A Unspecified open wound, right hip, initial encounter: Secondary | ICD-10-CM | POA: Diagnosis not present

## 2016-08-03 DIAGNOSIS — Z96641 Presence of right artificial hip joint: Secondary | ICD-10-CM | POA: Diagnosis present

## 2016-08-03 DIAGNOSIS — J449 Chronic obstructive pulmonary disease, unspecified: Secondary | ICD-10-CM | POA: Diagnosis present

## 2016-08-03 DIAGNOSIS — M25551 Pain in right hip: Secondary | ICD-10-CM | POA: Diagnosis not present

## 2016-08-03 DIAGNOSIS — L8990 Pressure ulcer of unspecified site, unspecified stage: Secondary | ICD-10-CM | POA: Insufficient documentation

## 2016-08-03 DIAGNOSIS — T8451XD Infection and inflammatory reaction due to internal right hip prosthesis, subsequent encounter: Secondary | ICD-10-CM | POA: Diagnosis not present

## 2016-08-03 DIAGNOSIS — F0390 Unspecified dementia without behavioral disturbance: Secondary | ICD-10-CM | POA: Diagnosis present

## 2016-08-03 DIAGNOSIS — T814XXA Infection following a procedure, initial encounter: Secondary | ICD-10-CM | POA: Diagnosis not present

## 2016-08-03 DIAGNOSIS — Z6820 Body mass index (BMI) 20.0-20.9, adult: Secondary | ICD-10-CM | POA: Diagnosis not present

## 2016-08-03 DIAGNOSIS — L03115 Cellulitis of right lower limb: Secondary | ICD-10-CM | POA: Diagnosis not present

## 2016-08-03 DIAGNOSIS — M810 Age-related osteoporosis without current pathological fracture: Secondary | ICD-10-CM | POA: Diagnosis present

## 2016-08-03 DIAGNOSIS — B9561 Methicillin susceptible Staphylococcus aureus infection as the cause of diseases classified elsewhere: Secondary | ICD-10-CM | POA: Diagnosis not present

## 2016-08-03 DIAGNOSIS — E440 Moderate protein-calorie malnutrition: Secondary | ICD-10-CM | POA: Diagnosis not present

## 2016-08-03 DIAGNOSIS — M00851 Arthritis due to other bacteria, right hip: Secondary | ICD-10-CM | POA: Diagnosis not present

## 2016-08-03 DIAGNOSIS — B961 Klebsiella pneumoniae [K. pneumoniae] as the cause of diseases classified elsewhere: Secondary | ICD-10-CM | POA: Diagnosis not present

## 2016-08-03 DIAGNOSIS — F0391 Unspecified dementia with behavioral disturbance: Secondary | ICD-10-CM | POA: Diagnosis not present

## 2016-08-03 DIAGNOSIS — T8130XA Disruption of wound, unspecified, initial encounter: Secondary | ICD-10-CM | POA: Diagnosis present

## 2016-08-03 DIAGNOSIS — F0280 Dementia in other diseases classified elsewhere without behavioral disturbance: Secondary | ICD-10-CM | POA: Diagnosis present

## 2016-08-03 DIAGNOSIS — L97519 Non-pressure chronic ulcer of other part of right foot with unspecified severity: Secondary | ICD-10-CM | POA: Diagnosis not present

## 2016-08-03 DIAGNOSIS — S72041A Displaced fracture of base of neck of right femur, initial encounter for closed fracture: Secondary | ICD-10-CM | POA: Diagnosis not present

## 2016-08-03 DIAGNOSIS — Z9841 Cataract extraction status, right eye: Secondary | ICD-10-CM | POA: Diagnosis not present

## 2016-08-03 DIAGNOSIS — R69 Illness, unspecified: Secondary | ICD-10-CM | POA: Diagnosis not present

## 2016-08-03 DIAGNOSIS — T8131XA Disruption of external operation (surgical) wound, not elsewhere classified, initial encounter: Secondary | ICD-10-CM | POA: Diagnosis not present

## 2016-08-03 DIAGNOSIS — E11621 Type 2 diabetes mellitus with foot ulcer: Secondary | ICD-10-CM | POA: Diagnosis not present

## 2016-08-03 DIAGNOSIS — Z96649 Presence of unspecified artificial hip joint: Secondary | ICD-10-CM

## 2016-08-03 DIAGNOSIS — L0889 Other specified local infections of the skin and subcutaneous tissue: Secondary | ICD-10-CM | POA: Diagnosis not present

## 2016-08-03 HISTORY — DX: Infection following a procedure, other surgical site, initial encounter: T81.49XA

## 2016-08-03 LAB — CBC
HCT: 37.9 % (ref 36.0–46.0)
HEMOGLOBIN: 13.1 g/dL (ref 12.0–15.0)
MCH: 30.5 pg (ref 26.0–34.0)
MCHC: 34.6 g/dL (ref 30.0–36.0)
MCV: 88.1 fL (ref 78.0–100.0)
Platelets: 198 10*3/uL (ref 150–400)
RBC: 4.3 MIL/uL (ref 3.87–5.11)
RDW: 12.9 % (ref 11.5–15.5)
WBC: 12 10*3/uL — AB (ref 4.0–10.5)

## 2016-08-03 LAB — COMPREHENSIVE METABOLIC PANEL
ALBUMIN: 2.8 g/dL — AB (ref 3.5–5.0)
ALK PHOS: 122 U/L (ref 38–126)
ALT: 16 U/L (ref 14–54)
ANION GAP: 9 (ref 5–15)
AST: 16 U/L (ref 15–41)
BILIRUBIN TOTAL: 0.4 mg/dL (ref 0.3–1.2)
BUN: 21 mg/dL — ABNORMAL HIGH (ref 6–20)
CALCIUM: 9.5 mg/dL (ref 8.9–10.3)
CO2: 25 mmol/L (ref 22–32)
Chloride: 99 mmol/L — ABNORMAL LOW (ref 101–111)
Creatinine, Ser: 0.75 mg/dL (ref 0.44–1.00)
GFR calc Af Amer: >60 mL/min (ref >60)
GLUCOSE: 425 mg/dL — AB (ref 65–99)
Potassium: 3.8 mmol/L (ref 3.5–5.1)
Sodium: 133 mmol/L — ABNORMAL LOW (ref 135–145)
TOTAL PROTEIN: 6.7 g/dL (ref 6.5–8.1)

## 2016-08-03 LAB — I-STAT CG4 LACTIC ACID, ED
LACTIC ACID, VENOUS: 1.75 mmol/L (ref 0.5–1.9)
Lactic Acid, Venous: 2.04 mmol/L (ref 0.5–1.9)

## 2016-08-03 LAB — CBG MONITORING, ED: Glucose-Capillary: 307 mg/dL — ABNORMAL HIGH (ref 65–99)

## 2016-08-03 MED ORDER — GLUCERNA SHAKE PO LIQD
237.0000 mL | Freq: Two times a day (BID) | ORAL | Status: DC
  Administered 2016-08-06 – 2016-08-12 (×7): 237 mL via ORAL

## 2016-08-03 MED ORDER — CALCIUM CARBONATE-VITAMIN D 500-200 MG-UNIT PO TABS
2.0000 | ORAL_TABLET | Freq: Every day | ORAL | Status: DC
  Administered 2016-08-04 – 2016-08-12 (×7): 2 via ORAL
  Filled 2016-08-03 (×8): qty 2

## 2016-08-03 MED ORDER — INSULIN ASPART PROT & ASPART (70-30 MIX) 100 UNIT/ML ~~LOC~~ SUSP
9.0000 [IU] | Freq: Two times a day (BID) | SUBCUTANEOUS | Status: AC
  Administered 2016-08-04 (×2): 9 [IU] via SUBCUTANEOUS
  Filled 2016-08-03 (×2): qty 10

## 2016-08-03 MED ORDER — OMEGA-3-ACID ETHYL ESTERS 1 G PO CAPS
1.0000 g | ORAL_CAPSULE | Freq: Every day | ORAL | Status: DC
  Administered 2016-08-06 – 2016-08-10 (×4): 1 g via ORAL
  Filled 2016-08-03 (×7): qty 1

## 2016-08-03 MED ORDER — HEPARIN SODIUM (PORCINE) 5000 UNIT/ML IJ SOLN: 5000.0000 [IU] | Freq: Three times a day (TID) | INTRAMUSCULAR | Status: DC

## 2016-08-03 MED ORDER — HEPARIN SODIUM (PORCINE) 5000 UNIT/ML IJ SOLN: 5000.0000 [IU] | Freq: Once | INTRAMUSCULAR | Status: DC

## 2016-08-03 MED ORDER — ALENDRONATE SODIUM 70 MG PO TABS: 70.0000 mg | ORAL_TABLET | ORAL | Status: DC

## 2016-08-03 MED ORDER — SODIUM CHLORIDE 0.9 % IV BOLUS (SEPSIS)
1000.0000 mL | Freq: Once | INTRAVENOUS | Status: AC
  Administered 2016-08-03: 1000 mL via INTRAVENOUS

## 2016-08-03 MED ORDER — SODIUM CHLORIDE 0.9 % IV BOLUS (SEPSIS)
500.0000 mL | Freq: Once | INTRAVENOUS | Status: AC
  Administered 2016-08-03: 500 mL via INTRAVENOUS

## 2016-08-03 MED ORDER — SIMVASTATIN 10 MG PO TABS
10.0000 mg | ORAL_TABLET | Freq: Every day | ORAL | Status: DC
  Administered 2016-08-04 – 2016-08-06 (×3): 10 mg via ORAL
  Filled 2016-08-03 (×3): qty 1

## 2016-08-03 MED ORDER — TRAMADOL HCL 50 MG PO TABS
50.0000 mg | ORAL_TABLET | Freq: Four times a day (QID) | ORAL | Status: DC | PRN
Start: 2016-08-03 — End: 2016-08-12
  Administered 2016-08-04 – 2016-08-08 (×4): 50 mg via ORAL
  Filled 2016-08-03 (×5): qty 1

## 2016-08-03 MED ORDER — OMEGA 3-6-9 COMPLEX PO CAPS: 1.0000 | ORAL_CAPSULE | Freq: Every day | ORAL | Status: DC

## 2016-08-03 NOTE — Progress Notes (Signed)
PHARMACIST - PHYSICIAN COMMUNICATION  CONCERNING: P&T Medication Policy Regarding Oral Bisphosphonates  RECOMMENDATION: Your order for alendronate (Fosamax), ibandronate (Boniva), or risedronate (Actonel) has been discontinued at this time.  If the patient's post-hospital medical condition warrants safe use of this class of drugs, please resume the pre-hospital regimen upon discharge.  DESCRIPTION:  Alendronate (Fosamax), ibandronate (Boniva), and risedronate (Actonel) can cause severe esophageal erosions in patients who are unable to remain upright at least 30 minutes after taking this medication.   Since brief interruptions in therapy are thought to have minimal impact on bone mineral density, the Pharmacy & Therapeutics Committee has established that bisphosphonate orders should be routinely discontinued during hospitalization.   To override this safety policy and permit administration of Boniva, Fosamax, or Actonel in the hospital, prescribers must write "DO NOT HOLD" in the comments section when placing the order for this class of medications.   Christoper Fabianaron Zoha Spranger, PharmD, BCPS Clinical pharmacist, pager [phone] 08/03/2016 11:57 PM

## 2016-08-03 NOTE — ED Notes (Signed)
Attempted report x1. 

## 2016-08-03 NOTE — ED Triage Notes (Signed)
Rt hip pain and drainage that started today  The pt had a  Rt hip replacement 12-12    The incision is red swollen  And the drainage is purulent.  No temp no fall she was getting on the potty chair when the wound began to drain  1030 this am

## 2016-08-03 NOTE — ED Notes (Signed)
Nurse will have registration correct birthday.

## 2016-08-03 NOTE — ED Notes (Signed)
Pt is refusing second set of blood cultures at this time

## 2016-08-03 NOTE — Consult Note (Signed)
ORTHO CONSULT NOTE  08/03/2016 7:40 PM      _0 Hide copied text _1 Hover for attribution information Sharon Butler is an 83 y.o. female.   Chief Complaint: right hip redness and drainage HPI: 83 yo female who presents one month after right anterior approach hip hemi-arthroplasty with a several day history of increasing erythema and drainage from her incision.  No fevers or chills.  She is able to ambulate without significant pain on the hip.      Past Medical History:  Diagnosis Date  . Alzheimer's dementia    "dx'd 05/2015; final stages" (06/15/2016)  . Complication of anesthesia    "problems waking up from gallbladder OR in 1978"  . COPD (chronic obstructive pulmonary disease) ([facility])   . High cholesterol   . History of blood transfusion 1996   "related to appendix OR"  . Type II diabetes mellitus ([place]) 1998         Past Surgical History:  Procedure Laterality Date  . ANTERIOR APPROACH HEMI HIP ARTHROPLASTY Right 07/07/2016   Procedure: ANTERIOR APPROACH HEMI HIP ARTHROPLASTY;  Surgeon: Rod Can, MD;  Location: [facility];  Service: Orthopedics;  Laterality: Right;  . APPENDECTOMY  1986  . CATARACT EXTRACTION W/ INTRAOCULAR LENS  IMPLANT, BILATERAL Bilateral   . CHOLECYSTECTOMY  1978  . DILATION AND CURETTAGE OF UTERUS      No family history on file. Social History:  reports that she has never smoked. She has never used smokeless tobacco. She reports that she does not drink alcohol or use drugs.  Allergies:       Allergies  Allergen Reactions  . Codeine Hives and Itching    Hives itching  . Other Other (See Comments)    Anesthesia-trouble waking up  . Penicillins Other (See Comments)    Anaphylactic shock Has patient had a PCN reaction causing immediate rash, facial/tongue/throat swelling, SOB or lightheadedness with hypotension:YES Has patient had a PCN reaction causing severe rash involving mucus membranes or skin necrosis: NO Has patient  had a PCN reaction that required hospitalization NO Has patient had a PCN reaction occurring within the last 10 years:NO If all of the above answers are "NO", then may proceed with Cephalosporin use.  . Shellfish Allergy Hives    hives     (Not in a hospital admission)        Results for orders placed or performed during the hospital encounter of 08/03/16 (from the past 48 hour(s))  Comprehensive metabolic panel     Status: Abnormal   Collection Time: 08/03/16  3:37 PM  Result Value Ref Range   Sodium 133 (L) 135 - 145 mmol/L   Potassium 3.8 3.5 - 5.1 mmol/L   Chloride 99 (L) 101 - 111 mmol/L   CO2 25 22 - 32 mmol/L   Glucose, Bld 425 (H) 65 - 99 mg/dL   BUN 21 (H) 6 - 20 mg/dL   Creatinine, Ser 0.75 0.44 - 1.00 mg/dL   Calcium 9.5 8.9 - 10.3 mg/dL   Total Protein 6.7 6.5 - 8.1 g/dL   Albumin 2.8 (L) 3.5 - 5.0 g/dL   AST 16 15 - 41 U/L   ALT 16 14 - 54 U/L   Alkaline Phosphatase 122 38 - 126 U/L   Total Bilirubin 0.4 0.3 - 1.2 mg/dL   GFR calc non Af Amer >60 >60 mL/min   GFR calc Af Amer >60 >60 mL/min    Comment: (NOTE) The eGFR has been calculated using the CKD EPI  equation. This calculation has not been validated in all clinical situations. eGFR's persistently <60 mL/min signify possible Chronic Kidney Disease.   Anion gap 9 5 - 15  CBC     Status: Abnormal   Collection Time: 08/03/16  3:37 PM  Result Value Ref Range   WBC 12.0 (H) 4.0 - 10.5 K/uL   RBC 4.30 3.87 - 5.11 MIL/uL   Hemoglobin 13.1 12.0 - 15.0 g/dL   HCT 37.9 36.0 - 46.0 %   MCV 88.1 78.0 - 100.0 fL   MCH 30.5 26.0 - 34.0 pg   MCHC 34.6 30.0 - 36.0 g/dL   RDW 12.9 11.5 - 15.5 %   Platelets 198 150 - 400 K/uL  I-Stat CG4 Lactic Acid, ED     Status: None   Collection Time: 08/03/16  4:10 PM  Result Value Ref Range   Lactic Acid, Venous 1.75 0.5 - 1.9 mmol/L  I-Stat CG4 Lactic Acid, ED     Status: Abnormal   Collection Time: 08/03/16  7:01 PM  Result Value Ref  Range   Lactic Acid, Venous 2.04 (HH) 0.5 - 1.9 mmol/L   Comment NOTIFIED PHYSICIAN    No results found.  ROS  Blood pressure 128/57, pulse 67, temperature 98.4 F (36.9 C), resp. rate 18, height _0  (1.549 m), weight 48.5 kg (107 lb), SpO2 99 %. Physical Exam Elderly female in no significant distress , right hip incision with surrounding erythema and induration.  I am able to express some purulent drainage with direct pressure.  No fluctuance noted.  No pain with AROM and PROM of the hip.  Leg length is equal  Assessment/Plan Right hip cellulitis s/p hemiarthroplasty, question deeper infection.  Discussed with Dr Lyla Glassing who recommends Internal Medicine admission due to complex medical situation.  No antibiotics per Dr Lyla Glassing until interventional radiologist flouro guided aspiration to rule out septic hip joint tomorrow. Thank you!   Dr Lyla Glassing to see patient tomorrow.   Augustin Schooling, MD 08/03/2016, 7:40 PM

## 2016-08-03 NOTE — H&P (Signed)
Date: 08/03/2016               Patient Name:  Sharon Butler MRN: [id number]  DOB: 1929-07-23 Age / Sex: 83 y.o., female   PCP: Noemi Chapel, DO         Medical Service: Internal Medicine Teaching Service         Attending Physician: Dr. Inez Catalina, MD    First Contact: Dr. Laural Benes Pager: [phone]  Second Contact: Dr. Johnny Bridge Pager: [phone]       After Hours (After 5p/  First Contact Pager: [phone]  weekends / holidays): Second Contact Pager: [phone]   Chief Complaint: surgical wound draining  History of Present Illness:  Sharon Butler is a 83yo female with PMH of Alzheimer's Dementia, T2DM, and h/o of decubitus ulcers presenting to the ED today with one day history of swelling, redness and drainage from her recent right hip replacement surgical site (07/07/16).   History mostly provided by daughter. She states that redness started last night around the incision site and seems to have spread more today. Today when patient attempted to use the restroom, the wound opened up and drained about 1/2 oz of serosanguinous fluid. Patient has not had a change in her activity or mentation, fevers, chills, nausea, vomiting, diarrhea. Patient has been ambulating well after surgery; she did have one soft fall about a week ago without head or body injury.  Daughter states they saw ortho outpatient and had no mention of when to stop lovenox so they have continued taking it - denies hematuria, hematochezia, melena, epistaxis, or other signs of bleeding. Patient and daughter also deny increased urinary frequency, cough, chest pain, shortness of breath, rash.   Meds:  Current Meds  Medication Sig  . alendronate (FOSAMAX) 70 MG tablet Take 1 tablet (70 mg total) by mouth once a week. Take with a full glass of water on an empty stomach. (Patient taking differently: Take 70 mg by mouth once a week. Take with a full glass of water on an empty stomach. Take on Thursdays)  . Ascorbic Acid (VITA-C PO)  Take 1 tablet by mouth daily as needed (for immune).  . calcium-vitamin D (OSCAL WITH D) 500-200 MG-UNIT tablet Take 2 tablets by mouth daily.  Marland Kitchen CINNAMON PO Take 2 tablets by mouth 2 (two) times daily.   Marland Kitchen enoxaparin (LOVENOX) 30 MG/0.3ML injection Inject 0.3 mLs (30 mg total) into the skin daily.  . feeding supplement, GLUCERNA SHAKE, (GLUCERNA SHAKE) LIQD Take 237 mLs by mouth 2 (two) times daily between meals. (Patient taking differently: Take 237 mLs by mouth See admin instructions. )  . ibuprofen (ADVIL,MOTRIN) 200 MG tablet Take 200-400 mg by mouth every 6 (six) hours as needed for headache or moderate pain.  Marland Kitchen insulin aspart protamine- aspart (NOVOLOG MIX 70/30) (70-30) 100 UNIT/ML injection Inject 0.18 mLs (18 Units total) into the skin 2 (two) times daily with a meal.  . methocarbamol (ROBAXIN) 500 MG tablet Take 1 tablet (500 mg total) by mouth every 6 (six) hours as needed for muscle spasms.  . mupirocin ointment (BACTROBAN) 2 % Place 1 application into the nose 2 (two) times daily. For 5 days  . Omega 3-6-9 Fatty Acids (OMEGA 3-6-9 COMPLEX) CAPS Take 1 capsule by mouth daily.  . simvastatin (ZOCOR) 10 MG tablet Take 10 mg by mouth daily.  . traMADol-acetaminophen (ULTRACET) 37.5-325 MG tablet Take 1-2 tablets by mouth every 6 (six) hours as needed for moderate pain or severe  pain.   Allergies: Allergies as of 08/03/2016 - Review Complete 08/03/2016  Allergen Reaction Noted  . Codeine Hives and Itching 06/15/2016  . Other Other (See Comments) 07/05/2016  . Penicillins Other (See Comments) 06/15/2016  . Shellfish allergy Hives 06/15/2016   Past Medical History:  Diagnosis Date  . Alzheimer's dementia    "dx'd 05/2015; final stages" (06/15/2016)  . Complication of anesthesia    "problems waking up from gallbladder OR in 1978"  . COPD (chronic obstructive pulmonary disease) ([facility])   . High cholesterol   . History of blood transfusion 1996   "related to appendix OR"  . Type II  diabetes mellitus ([facility]) 1998    Family History: Multiple family members with DM and dementia.  Social History: Lives with daughter and husband; denies past or current tobacco, alcohol or drug use.  Review of Systems: A complete ROS was negative except as per HPI.   Physical Exam: Blood pressure 158/62, pulse 63, temperature 98.4 F (36.9 C), resp. rate 18, height 5\' 1"  (1.549 m), weight 107 lb (48.5 kg), SpO2 100 %. General: alert, cachectic, and cooperative to examination.  Head: normocephalic and atraumatic.  Eyes: vision grossly intact, pupils equal, pupils round, pupils reactive to light, no injection and anicteric.  Mouth: pharynx pink and moist, no erythema, and no exudates.  Neck: supple, full ROM, no thyromegaly,.  Lungs: normal respiratory effort, no accessory muscle use, normal breath sounds, no crackles, and no wheezes. Heart: normal rate, regular rhythm, no murmur, no gallop, and no rub.  Abdomen: soft, non-tender, normal bowel sounds, no distention, no guarding, no rebound tenderness.  Msk: surgical incision over right hip with surrounding induration and erythema; a couple of small points of wound dehiscence with serosanguinous drainage; does not appear incredibly tender to palpation. Patient able to move all 4 extremities without limitation.  Pulses: 2+ DP/PT pulses bilaterally Extremities: No cyanosis, clubbing, edema Neurologic: alert & oriented X1, cranial nerves II-XII intact, strength normal in all extremities, sensation intact to light touch.  Skin: right heel decubitus ulcer with mild surrounding erythema and dry base without tenderness or drainage; sacral decubitus ulcer with clean base, yellow tissue around edges and mild serosanguinous drainage.    Psych: Oriented X1, pleasantly confused, twisting and rearranging sheets  LABS: Na 133, K 3.8, Cl 99, CO2 25, BUN 21, Cr 0.75, Glu 425 WC 12, Hgb 13.1, Hct 37.1, Plts 198 LA 1.75>2.0  Assessment & Plan by  Problem: Active Problems:   Infected surgical wound  Infected surgical wound: Patient with right hip replacement on 07/08/2016 presenting with serosanguinous drainage from surgical site and surrounding induration/erythema. Patient is afebrile and with leukocytosis. Ortho evaluated and will do CT guided aspiration tomorrow. LA 1.7>2.04. --aspiration with IR tomorrow; no IV Abx until after drain per ortho to rule out septic joint --s/p 1.5L NS bolus; trend LA --Tramadol 50mg  PRN for pain --NPO at midnight --BCx pending --f/u AM CBC  T2DM: Patient on home regimen of Novolog 70/30 18U BID as this is easier regimen for her to allow decreased sticks; her last A1C in Nov 2017 was 10.5. On admission glucose was elevated to 400's which could be exacerbated by wound infection.  --s/p 1.5L NS bolus --CBG's q6hrs --Novolog 70/30 9U BID  Chronic multiple decubitus ulcers: Patient with well healing sacral and heel ulcers obtained from stay in [place] hospital last year. Heel ulcer appears to be healing well; slight worsening in condition of sacral ulcer from initial presentation in Nov.  --  wound care consult - appreciate their help  Osteoporosis: Patient continued on weekly Alendronate 70mg  dose that she takes every Thursday. Continued home calcium-vit D supplementation.  Diet: CM; NPO after midnight 1/11 IVF: none DVT: heparin sq Code: FULL  Dispo: Admit patient to Inpatient with expected length of stay greater than 2 midnights.  Signed: Nyra MarketGorica Pantera Winterrowd, MD 08/03/2016, 10:27 PM  Pager [phone]

## 2016-08-03 NOTE — ED Provider Notes (Signed)
[facility]-EMERGENCY DEPT Provider Note   CSN: [id number] Arrival date & time: 08/03/16  1454     History   Chief Complaint Chief Complaint  Patient presents with  . Wound Check    HPI Sharon Butler is a 83 y.o. female.  The history is provided by the patient and a relative.  Wound Check  This is a new problem. The current episode started yesterday. The problem occurs constantly. The problem has been gradually worsening. Pertinent negatives include no chest pain, no abdominal pain, no headaches and no shortness of breath. Nothing aggravates the symptoms. Nothing relieves the symptoms. She has tried nothing for the symptoms. The treatment provided no relief.   Had recent right hip replacement last month. Family noted redness last night and drainage this morning. No noted fevers, chills, cough, abd pain, dysuria.  Pt does have Alzheimer's with dementia and has memory issues.  Past Medical History:  Diagnosis Date  . Alzheimer's dementia    "dx'd 05/2015; final stages" (06/15/2016)  . Complication of anesthesia    "problems waking up from gallbladder OR in 1978"  . COPD (chronic obstructive pulmonary disease) ([facility])   . High cholesterol   . History of blood transfusion 1996   "related to appendix OR"  . Type II diabetes mellitus ([facility]) 1998    Patient Active Problem List   Diagnosis Date Noted  . Poorly controlled type 2 diabetes mellitus ([facility])   . Displaced fracture of right femoral neck ([facility]) 07/07/2016  . Closed displaced fracture of right femoral neck ([facility]) 07/05/2016  . Dementia 07/05/2016  . Decubitus ulcer of right foot, stage 2   . Decubitus ulcer of sacral region, stage 2   . Diabetes mellitus ([facility]) 06/15/2016    Past Surgical History:  Procedure Laterality Date  . ANTERIOR APPROACH HEMI HIP ARTHROPLASTY Right 07/07/2016   Procedure: ANTERIOR APPROACH HEMI HIP ARTHROPLASTY;  Surgeon: Samson FredericBrian Swinteck, MD;  Location: [facility] OR;  Service: Orthopedics;  Laterality: Right;    . APPENDECTOMY  1986  . CATARACT EXTRACTION W/ INTRAOCULAR LENS  IMPLANT, BILATERAL Bilateral   . CHOLECYSTECTOMY  1978  . DILATION AND CURETTAGE OF UTERUS      OB History    No data available       Home Medications    Prior to Admission medications   Medication Sig Start Date End Date Taking? Authorizing Provider  alendronate (FOSAMAX) 70 MG tablet Take 1 tablet (70 mg total) by mouth once a week. Take with a full glass of water on an empty stomach. 07/09/16   Valentino NoseNathan Boswell, MD  Ascorbic Acid (VITA-C PO) Take 1 tablet by mouth daily as needed (for immune).    Historical Provider, MD  calcium-vitamin D (OSCAL WITH D) 500-200 MG-UNIT tablet Take 2 tablets by mouth daily. 07/10/16   Valentino NoseNathan Boswell, MD  CINNAMON PO Take 2 tablets by mouth 2 (two) times daily.     Historical Provider, MD  enoxaparin (LOVENOX) 30 MG/0.3ML injection Inject 0.3 mLs (30 mg total) into the skin daily. 07/10/16   Valentino NoseNathan Boswell, MD  feeding supplement, GLUCERNA SHAKE, (GLUCERNA SHAKE) LIQD Take 237 mLs by mouth 2 (two) times daily between meals. 06/17/16   Nyra MarketGorica Svalina, MD  ibuprofen (ADVIL,MOTRIN) 200 MG tablet Take 200-400 mg by mouth every 6 (six) hours as needed for headache or moderate pain.    Historical Provider, MD  insulin aspart protamine- aspart (NOVOLOG MIX 70/30) (70-30) 100 UNIT/ML injection Inject 0.18 mLs (18 Units total) into the skin 2 (  two) times daily with a meal. 06/27/16   Lora Paula, MD  methocarbamol (ROBAXIN) 500 MG tablet Take 1 tablet (500 mg total) by mouth every 6 (six) hours as needed for muscle spasms. 07/09/16   Valentino Nose, MD  mupirocin ointment (BACTROBAN) 2 % Place 1 application into the nose 2 (two) times daily. For 5 days 07/09/16   Valentino Nose, MD  Omega 3-6-9 Fatty Acids (OMEGA 3-6-9 COMPLEX) CAPS Take 1 capsule by mouth daily.    Historical Provider, MD  polyethylene glycol (MIRALAX / GLYCOLAX) packet Take 17 g by mouth daily. 07/10/16   Valentino Nose, MD   simvastatin (ZOCOR) 10 MG tablet Take 10 mg by mouth daily.    Historical Provider, MD  traMADol-acetaminophen (ULTRACET) 37.5-325 MG tablet Take 1-2 tablets by mouth every 6 (six) hours as needed for moderate pain or severe pain. 07/09/16   Valentino Nose, MD    Family History No family history on file.  Social History Social History  Substance Use Topics  . Smoking status: Never Smoker  . Smokeless tobacco: Never Used  . Alcohol use No     Allergies   Codeine; Other; Penicillins; and Shellfish allergy   Review of Systems Review of Systems  Respiratory: Negative for shortness of breath.   Cardiovascular: Negative for chest pain.  Gastrointestinal: Negative for abdominal pain.  Neurological: Negative for headaches.  Ten systems are reviewed and are negative for acute change except as noted in the HPI    Physical Exam Updated Vital Signs BP 136/55   Pulse 76   Temp 98.4 F (36.9 C)   Resp 18   Ht 5\' 1"  (1.549 m)   Wt 107 lb (48.5 kg)   SpO2 97%   BMI 20.22 kg/m   Physical Exam  Constitutional: She is oriented to person, place, and time. She appears well-developed and well-nourished. No distress.  HENT:  Head: Normocephalic and atraumatic.  Nose: Nose normal.  Eyes: Conjunctivae and EOM are normal. Pupils are equal, round, and reactive to light. Right eye exhibits no discharge. Left eye exhibits no discharge. No scleral icterus.  Neck: Normal range of motion. Neck supple.  Cardiovascular: Normal rate and regular rhythm.  Exam reveals no gallop and no friction rub.   No murmur heard. Pulmonary/Chest: Effort normal and breath sounds normal. No stridor. No respiratory distress. She has no rales.  Abdominal: Soft. She exhibits no distension. There is no tenderness.  Musculoskeletal: She exhibits no edema or tenderness.  Neurological: She is alert and oriented to person, place, and time.  Skin: Skin is warm and dry. No rash noted. She is not diaphoretic. No  erythema.     Psychiatric: She has a normal mood and affect.  Vitals reviewed.    ED Treatments / Results  Labs (all labs ordered are listed, but only abnormal results are displayed) Labs Reviewed  COMPREHENSIVE METABOLIC PANEL - Abnormal; Notable for the following:       Result Value   Sodium 133 (*)    Chloride 99 (*)    Glucose, Bld 425 (*)    BUN 21 (*)    Albumin 2.8 (*)    All other components within normal limits  CBC - Abnormal; Notable for the following:    WBC 12.0 (*)    All other components within normal limits  URINALYSIS, ROUTINE W REFLEX MICROSCOPIC  I-STAT CG4 LACTIC ACID, ED    EKG  EKG Interpretation None       Radiology No  results found.  Procedures Procedures (including critical care time)  Medications Ordered in ED Medications - No data to display   Initial Impression / Assessment and Plan / ED Course  I have reviewed the triage vital signs and the nursing notes.  Pertinent labs & imaging results that were available during my care of the patient were reviewed by me and considered in my medical decision making (see chart for details).  Clinical Course as of Jan 11 0001  Wed Aug 03, 2016  1753 Infected surgical wound  [PC]  1930 Consultation orthopedic surgery who evaluated the patient in the emergency department and agreed with admission to the hospital. They requested no antibiotics at this time until IR is able to perform a joint aspiration to rule out deep wound/joint infection.  They requested admission to internal medicine for management of her chronic illnesses.  [PC]    Clinical Course User Index [PC] Nira Conn, MD      Final Clinical Impressions(s) / ED Diagnoses   Final diagnoses:  Infection of prosthetic hip joint, initial encounter [facility])      Nira Conn, MD 08/04/16 0002

## 2016-08-03 NOTE — H&P (Signed)
Sharon Butler is an 83 y.o. female.   Chief Complaint: right hip redness and drainage HPI: 83 yo female who presents one month after right anterior approach hip hemi-arthroplasty with a several day history of increasing erythema and drainage from her incision.  No fevers or chills.  She is able to ambulate without significant pain on the hip.  Past Medical History:  Diagnosis Date  . Alzheimer's dementia    "dx'd 05/2015; final stages" (06/15/2016)  . Complication of anesthesia    "problems waking up from gallbladder OR in 1978"  . COPD (chronic obstructive pulmonary disease) ([place])   . High cholesterol   . History of blood transfusion 1996   "related to appendix OR"  . Type II diabetes mellitus ([place]) 1998    Past Surgical History:  Procedure Laterality Date  . ANTERIOR APPROACH HEMI HIP ARTHROPLASTY Right 07/07/2016   Procedure: ANTERIOR APPROACH HEMI HIP ARTHROPLASTY;  Surgeon: Rod Can, MD;  Location: [facility];  Service: Orthopedics;  Laterality: Right;  . APPENDECTOMY  1986  . CATARACT EXTRACTION W/ INTRAOCULAR LENS  IMPLANT, BILATERAL Bilateral   . CHOLECYSTECTOMY  1978  . DILATION AND CURETTAGE OF UTERUS      No family history on file. Social History:  reports that she has never smoked. She has never used smokeless tobacco. She reports that she does not drink alcohol or use drugs.  Allergies:  Allergies  Allergen Reactions  . Codeine Hives and Itching    Hives itching  . Other Other (See Comments)    Anesthesia-trouble waking up  . Penicillins Other (See Comments)    Anaphylactic shock Has patient had a PCN reaction causing immediate rash, facial/tongue/throat swelling, SOB or lightheadedness with hypotension:YES Has patient had a PCN reaction causing severe rash involving mucus membranes or skin necrosis: NO Has patient had a PCN reaction that required hospitalization NO Has patient had a PCN reaction occurring within the last 10 years:NO If all of the above answers  are "NO", then may proceed with Cephalosporin use.  . Shellfish Allergy Hives    hives     (Not in a hospital admission)  Results for orders placed or performed during the hospital encounter of 08/03/16 (from the past 48 hour(s))  Comprehensive metabolic panel     Status: Abnormal   Collection Time: 08/03/16  3:37 PM  Result Value Ref Range   Sodium 133 (L) 135 - 145 mmol/L   Potassium 3.8 3.5 - 5.1 mmol/L   Chloride 99 (L) 101 - 111 mmol/L   CO2 25 22 - 32 mmol/L   Glucose, Bld 425 (H) 65 - 99 mg/dL   BUN 21 (H) 6 - 20 mg/dL   Creatinine, Ser 0.75 0.44 - 1.00 mg/dL   Calcium 9.5 8.9 - 10.3 mg/dL   Total Protein 6.7 6.5 - 8.1 g/dL   Albumin 2.8 (L) 3.5 - 5.0 g/dL   AST 16 15 - 41 U/L   ALT 16 14 - 54 U/L   Alkaline Phosphatase 122 38 - 126 U/L   Total Bilirubin 0.4 0.3 - 1.2 mg/dL   GFR calc non Af Amer >60 >60 mL/min   GFR calc Af Amer >60 >60 mL/min    Comment: (NOTE) The eGFR has been calculated using the CKD EPI equation. This calculation has not been validated in all clinical situations. eGFR's persistently <60 mL/min signify possible Chronic Kidney Disease.    Anion gap 9 5 - 15  CBC     Status: Abnormal  Collection Time: 08/03/16  3:37 PM  Result Value Ref Range   WBC 12.0 (H) 4.0 - 10.5 K/uL   RBC 4.30 3.87 - 5.11 MIL/uL   Hemoglobin 13.1 12.0 - 15.0 g/dL   HCT 37.9 36.0 - 46.0 %   MCV 88.1 78.0 - 100.0 fL   MCH 30.5 26.0 - 34.0 pg   MCHC 34.6 30.0 - 36.0 g/dL   RDW 12.9 11.5 - 15.5 %   Platelets 198 150 - 400 K/uL  I-Stat CG4 Lactic Acid, ED     Status: None   Collection Time: 08/03/16  4:10 PM  Result Value Ref Range   Lactic Acid, Venous 1.75 0.5 - 1.9 mmol/L  I-Stat CG4 Lactic Acid, ED     Status: Abnormal   Collection Time: 08/03/16  7:01 PM  Result Value Ref Range   Lactic Acid, Venous 2.04 (HH) 0.5 - 1.9 mmol/L   Comment NOTIFIED PHYSICIAN    No results found.  ROS  Blood pressure 128/57, pulse 67, temperature 98.4 F (36.9 C), resp. rate  18, height 5' 1"  (1.549 m), weight 48.5 kg (107 lb), SpO2 99 %. Physical Exam Elderly female in no significant distress , right hip incision with surrounding erythema and induration.  I am able to express some purulent drainage with direct pressure.  No fluctuance noted.  No pain with AROM and PROM of the hip.  Leg length is equal  Assessment/Plan Right hip cellulitis s/p hemiarthroplasty, question deeper infection.  Plan admission for IV abx and possible I+D.  Will discuss with Dr Lyla Glassing.  Maintain NPO  Augustin Schooling, MD 08/03/2016, 7:40 PM

## 2016-08-03 NOTE — ED Notes (Signed)
Nurse starting IV and will try to draw labs. 

## 2016-08-04 ENCOUNTER — Encounter (HOSPITAL_COMMUNITY): Payer: Self-pay | Admitting: General Practice

## 2016-08-04 ENCOUNTER — Inpatient Hospital Stay (HOSPITAL_COMMUNITY): Payer: Medicare HMO

## 2016-08-04 DIAGNOSIS — E440 Moderate protein-calorie malnutrition: Secondary | ICD-10-CM | POA: Insufficient documentation

## 2016-08-04 DIAGNOSIS — L8990 Pressure ulcer of unspecified site, unspecified stage: Secondary | ICD-10-CM | POA: Insufficient documentation

## 2016-08-04 DIAGNOSIS — E1165 Type 2 diabetes mellitus with hyperglycemia: Secondary | ICD-10-CM

## 2016-08-04 DIAGNOSIS — T814XXA Infection following a procedure, initial encounter: Principal | ICD-10-CM

## 2016-08-04 LAB — SYNOVIAL CELL COUNT + DIFF, W/ CRYSTALS
Crystals, Fluid: NONE SEEN
EOSINOPHILS-SYNOVIAL: 1 % (ref 0–1)
Lymphocytes-Synovial Fld: 3 % (ref 0–20)
MONOCYTE-MACROPHAGE-SYNOVIAL FLUID: 1 % — AB (ref 50–90)
NEUTROPHIL, SYNOVIAL: 95 % — AB (ref 0–25)
WBC, Synovial: UNDETERMINED /mm3 (ref 0–200)

## 2016-08-04 LAB — CBC
HCT: 37.5 % (ref 36.0–46.0)
Hemoglobin: 12.6 g/dL (ref 12.0–15.0)
MCH: 29.4 pg (ref 26.0–34.0)
MCHC: 33.6 g/dL (ref 30.0–36.0)
MCV: 87.6 fL (ref 78.0–100.0)
PLATELETS: 197 10*3/uL (ref 150–400)
RBC: 4.28 MIL/uL (ref 3.87–5.11)
RDW: 12.9 % (ref 11.5–15.5)
WBC: 9.9 10*3/uL (ref 4.0–10.5)

## 2016-08-04 LAB — LACTIC ACID, PLASMA
LACTIC ACID, VENOUS: 1.2 mmol/L (ref 0.5–1.9)
LACTIC ACID, VENOUS: 1.1 mmol/L (ref 0.5–1.9)
Lactic Acid, Venous: 1.1 mmol/L (ref 0.5–1.9)

## 2016-08-04 LAB — GLUCOSE, CAPILLARY
GLUCOSE-CAPILLARY: 241 mg/dL — AB (ref 65–99)
GLUCOSE-CAPILLARY: 323 mg/dL — AB (ref 65–99)
Glucose-Capillary: 374 mg/dL — ABNORMAL HIGH (ref 65–99)

## 2016-08-04 MED ORDER — SODIUM CHLORIDE 0.9 % IV SOLN
INTRAVENOUS | Status: AC
  Administered 2016-08-05: 01:00:00 via INTRAVENOUS

## 2016-08-04 MED ORDER — LIDOCAINE HCL (PF) 1 % IJ SOLN: 2.0000 mL | Freq: Once | INTRAMUSCULAR | Status: DC

## 2016-08-04 MED ORDER — INSULIN ASPART 100 UNIT/ML ~~LOC~~ SOLN
0.0000 [IU] | SUBCUTANEOUS | Status: DC
  Administered 2016-08-05: 3 [IU] via SUBCUTANEOUS
  Administered 2016-08-05: 9 [IU] via SUBCUTANEOUS

## 2016-08-04 MED ORDER — GERHARDT'S BUTT CREAM
TOPICAL_CREAM | Freq: Two times a day (BID) | CUTANEOUS | Status: DC
  Administered 2016-08-05 – 2016-08-12 (×9): via TOPICAL
  Filled 2016-08-04 (×2): qty 1

## 2016-08-04 MED ORDER — INSULIN NPH (HUMAN) (ISOPHANE) 100 UNIT/ML ~~LOC~~ SUSP
8.0000 [IU] | Freq: Once | SUBCUTANEOUS | Status: DC
  Filled 2016-08-04: qty 10

## 2016-08-04 MED ORDER — SODIUM CHLORIDE 0.9 % IJ SOLN
INTRAMUSCULAR | Status: AC
  Filled 2016-08-04: qty 20

## 2016-08-04 MED ORDER — VANCOMYCIN HCL IN DEXTROSE 750-5 MG/150ML-% IV SOLN
750.0000 mg | INTRAVENOUS | Status: DC
  Administered 2016-08-04 – 2016-08-06 (×3): 750 mg via INTRAVENOUS
  Filled 2016-08-04 (×5): qty 150

## 2016-08-04 MED ORDER — LEVOFLOXACIN IN D5W 500 MG/100ML IV SOLN
500.0000 mg | Freq: Once | INTRAVENOUS | Status: AC
  Administered 2016-08-04: 500 mg via INTRAVENOUS
  Filled 2016-08-04: qty 100

## 2016-08-04 MED ORDER — LIDOCAINE HCL 1 % IJ SOLN
INTRAMUSCULAR | Status: AC
  Filled 2016-08-04: qty 10

## 2016-08-04 MED ORDER — ENSURE ENLIVE PO LIQD
237.0000 mL | Freq: Two times a day (BID) | ORAL | Status: DC
  Administered 2016-08-04 – 2016-08-12 (×6): 237 mL via ORAL

## 2016-08-04 MED ORDER — ENSURE ENLIVE PO LIQD: 237.0000 mL | Freq: Two times a day (BID) | ORAL | Status: DC

## 2016-08-04 MED ORDER — SODIUM CHLORIDE 0.9 % IV BOLUS (SEPSIS)
250.0000 mL | Freq: Once | INTRAVENOUS | Status: AC
  Administered 2016-08-04: 250 mL via INTRAVENOUS

## 2016-08-04 MED ORDER — IOPAMIDOL (ISOVUE-M 200) INJECTION 41%
INTRAMUSCULAR | Status: AC
  Filled 2016-08-04: qty 10

## 2016-08-04 MED ORDER — TAB-A-VITE/IRON PO TABS
1.0000 | ORAL_TABLET | Freq: Every day | ORAL | Status: DC
  Administered 2016-08-06 – 2016-08-12 (×7): 1 via ORAL
  Filled 2016-08-04 (×9): qty 1

## 2016-08-04 NOTE — Evaluation (Signed)
Clinical/Bedside Swallow Evaluation Patient Details  Name: Sharon Butler MRN: [id number] Date of Birth: 09/30/1928  Today's Date: 08/04/2016 Time: SLP Start Time (ACUTE ONLY): 1340 SLP Stop Time (ACUTE ONLY): 1402 SLP Time Calculation (min) (ACUTE ONLY): 22 min  Past Medical History:  Past Medical History:  Diagnosis Date  . Alzheimer's dementia    "dx'd 05/2015; final stages" (06/15/2016)  . Complication of anesthesia    "problems waking up from gallbladder OR in 1978"  . COPD (chronic obstructive pulmonary disease) ([facility])   . High cholesterol   . History of blood transfusion 1996   "related to appendix OR"  . Infected surgical wound 07/2016   rt hip  . Type II diabetes mellitus ([facility]) 1998   Past Surgical History:  Past Surgical History:  Procedure Laterality Date  . ANTERIOR APPROACH HEMI HIP ARTHROPLASTY Right 07/07/2016   Procedure: ANTERIOR APPROACH HEMI HIP ARTHROPLASTY;  Surgeon: Samson Frederic, MD;  Location: [facility];  Service: Orthopedics;  Laterality: Right;  . APPENDECTOMY  1986  . CATARACT EXTRACTION W/ INTRAOCULAR LENS  IMPLANT, BILATERAL Bilateral   . CHOLECYSTECTOMY  1978  . DILATION AND CURETTAGE OF UTERUS     HPI:  83yo woman with PMH of Alzheimer's dementia, DM2, h/o decubitus ulcers (present on admission)  Who presented for swelling, redness and drainage from the surgical site from her recent hip replacement on 08/04/16.  Patient was sleeping.  History obtained from daughter.  She noticed a few hours of redness which started to spread.  Then when the patient sat down to use the restroom, the area opened up and drained serosanguinous fluid.   She has had a recent fall without head injury.  She also has been taking lovenox.  When we saw her, she was sleeping post IR sampling of fluid around hip.     Assessment / Plan / Recommendation Clinical Impression   Pt without overt s/s of aspiration during BSE with varying consistencies of thin via straw-solids; impaired  mastication primarily d/t missing/poor dentition; nursing stated pt held medication earlier and let dissolve vs swallowing readily; family noted she has intermittent difficulty with medications and will expel from oral cavity and/or swallow with encouragement when at home.  Recommend to continue current diet of Regular/thin and medications whole in puree for ease of intake prn. No f/u ST warranted at this time.    Aspiration Risk  Mild aspiration risk d/t cognitive deficits    Diet Recommendation   Regular/thin liquids  Medication Administration: Whole meds with puree    Other  Recommendations Oral Care Recommendations: Oral care BID   Follow up Recommendations None      Frequency and Duration     n/a       Prognosis Prognosis for Safe Diet Advancement: Good      Swallow Study   General Date of Onset: 08/04/16 HPI: 83yo woman with PMH of Alzheimer's dementia, DM2, h/o decubitus ulcers (present on admission)  Who presented for swelling, redness and drainage from the surgical site from her recent hip replacement.  Patient was sleeping.  History obtained from daughter.  She noticed a few hours of redness which started to spread.  Then when the patient sat down to use the restroom, the area opened up and drained serosanguinous fluid.   She has had a recent fall without head injury.  She also has been taking lovenox.  When we saw her, she was sleeping post IR sampling of fluid around hip.   Type of Study: Bedside  Swallow Evaluation Diet Prior to this Study: Regular;Thin liquids Temperature Spikes Noted: No Respiratory Status: Room air History of Recent Intubation: No Behavior/Cognition: Alert;Cooperative;Confused Oral Cavity Assessment: Within Functional Limits Oral Care Completed by SLP: No Oral Cavity - Dentition: Poor condition;Missing dentition Vision: Functional for self-feeding Self-Feeding Abilities: Able to feed self;Needs assist Patient Positioning: Upright in bed Baseline  Vocal Quality: Low vocal intensity Volitional Cough: Strong Volitional Swallow: Able to elicit    Oral/Motor/Sensory Function Overall Oral Motor/Sensory Function: Within functional limits   Ice Chips Ice chips: Not tested   Thin Liquid Thin Liquid: Within functional limits Presentation: Straw    Nectar Thick Nectar Thick Liquid: Not tested   Honey Thick Honey Thick Liquid: Not tested   Puree Puree: Within functional limits Presentation: Spoon   Solid      Solid: Impaired Presentation: Spoon Oral Phase Impairments: Impaired mastication Oral Phase Functional Implications: Impaired mastication    Functional Assessment Tool Used: NOMS Functional Limitations: Swallowing Swallow Current Status ([id number](G8996): At least 1 percent but less than 20 percent impaired, limited or restricted Swallow Goal Status [phone]([id number]): At least 1 percent but less than 20 percent impaired, limited or restricted Swallow Discharge Status [phone](G8998): At least 1 percent but less than 20 percent impaired, limited or restricted   Teghan Philbin,PAT, M.S., CCC-SLP 08/04/2016,2:43 PM

## 2016-08-04 NOTE — Progress Notes (Addendum)
Inpatient Diabetes Program Recommendations  AACE/ADA: New Consensus Statement on Inpatient Glycemic Control (2015)  Target Ranges:  Prepandial:   less than 140 mg/dL      Peak postprandial:   less than 180 mg/dL (1-2 hours)      Critically ill patients:  140 - 180 mg/dL   Lab Results  Component Value Date   GLUCAP 241 (H) 08/04/2016   HGBA1C 10.5 (H) 06/15/2016    Review of Glycemic Control: Results for Fabio BeringGOSTO, [place] (MRN [id number]) as of 08/04/2016 14:20  Ref. Range 08/03/2016 23:15 08/04/2016 00:17 08/04/2016 07:07 08/04/2016 12:22  Glucose-Capillary Latest Ref Range: 65 - 99 mg/dL 409307 (H) 811323 (H) 914374 (H) 241 (H)   Diabetes history: Type 2 diabetes Outpatient Diabetes medications: Novolog 70/30 mix 18 units bid Current orders for Inpatient glycemic control:  Novolog 70/30 mix 9 units bid  Inpatient Diabetes Program Recommendations:   While in the hospital, consider increasing 70/30 mix to 12 unit bid.  When NPO on 08/06/15,  will need to hold 70/30. Consider NPH 8 units in the AM prior to surgery while NPO and increase Novolog correction to q 4 hours while NPO.  Thanks, Beryl MeagerJenny Keyan Folson, RN, BC-ADM Inpatient Diabetes Coordinator Pager [phone] (8a-5p)

## 2016-08-04 NOTE — Progress Notes (Signed)
Subjective: Imaged-guided hip joint aspiration this morning with IR. Daughter at bedside concerned about her mother going to surgery unnecessarily. Discussed the concern for joint infection and the need to some degree of surgical evaluation to assess the incision that reportedly has re-opened and is draining serosanguinous fluids. Her daughter reports her mother has been acting herself, not seeming to be in any more discomfort than usual - sleeping comfortably at the time of our visit today.  Objective: Vital signs in last 24 hours: Vitals:   08/03/16 2245 08/03/16 2246 08/03/16 2341 08/04/16 0502  BP: 145/61 145/61 (!) 154/63 (!) 142/59  Pulse: 77 76 85 66  Resp:  16 20 18   Temp:   98.5 F (36.9 C) 98.4 F (36.9 C)  TempSrc:   Oral Oral  SpO2: 99% 100% 100% 98%  Weight:      Height:        Intake/Output Summary (Last 24 hours) at 08/04/16 0851 Last data filed at 08/04/16 [id number]  Gross per 24 hour  Intake              120 ml  Output                0 ml  Net              120 ml    Physical Exam General appearance: Thin and malnurished-appearing elderly woman comfortably asleep in bed, oriented to self, pleasantly demented HENT: Normocephalic, temporal wasting, dry mucous membranes with oral visible oral thrush Cardiovascular: Regular rate and rhythm, no murmurs, rubs, gallops Respiratory/Chest: Clear to ausculation bilaterally, normal work of breathing Abdomen: Bowel sounds present, soft, non-tender, non-distended Ext: Thin bulk and normal passive range of motion, movement of the right hip and palpation over her bandaged incision elicits no pain Skin: Warm, dry, bandage over right lateral thigh with no surrounding erythema, warmth, or swelling. Bandaged pressure ulcers over heels/feet bilaterally and over sacrum, markedly decreased skin turgor Neuro: Cranial nerves grossly intact, alert and oriented Psych: Appropriate affect  Labs / Imaging / Procedures: CBC Latest Ref Rng &  Units 08/04/2016 08/03/2016 07/09/2016  WBC 4.0 - 10.5 K/uL 9.9 12.0(H) 7.4  Hemoglobin 12.0 - 15.0 g/dL 96.012.6 45.413.1 11.4(L)  Hematocrit 36.0 - 46.0 % 37.5 37.9 33.5(L)  Platelets 150 - 400 K/uL 197 198 145(L)   BMP Latest Ref Rng & Units 08/03/2016 07/09/2016 07/08/2016  Glucose 65 - 99 mg/dL 098(J425(H) 191(Y349(H) 782(N165(H)  BUN 6 - 20 mg/dL 56(O21(H) 18 12  Creatinine 0.44 - 1.00 mg/dL 1.300.75 8.650.93 7.840.73  Sodium 135 - 145 mmol/L 133(L) 131(L) 137  Potassium 3.5 - 5.1 mmol/L 3.8 4.1 3.9  Chloride 101 - 111 mmol/L 99(L) 97(L) 103  CO2 22 - 32 mmol/L 25 27 28   Calcium 8.9 - 10.3 mg/dL 9.5 8.9 8.9   No results found.  Assessment/Plan: Presumed surgical wound infection vs poor wound healing / dehiscence: Patient with right hip replacement on 07/08/2016 presenting with serosanguinous drainage from surgical site and surrounding induration/erythema. Patient is afebrile and with resolved leukocytosis. Ortho evaluated patient underwent image-guided joint aspiration on 1/11 which revealed bloody fluid with a few neutrophils, gram stain showed no organisms and visible WBCs.  LA 1.7>2.04>1.2>1.1>1.1. Tentatively planned for OR , ortho relook on 1/12  -- Ortho surgical eval tomorrow, I&D vs debridement vs hardware replacement depending on findings -- Bland aspirate results, culture pending, will continue empiric Vancomycin per pharmacy and Levaquin x1 for now --Light IVF overnight, clinically dehydrated --Tramadol 50mg   PRN for pain --NPO at midnight -- Follow BCx  -- Trend CBC  T2DM: Patient on home regimen of Novolog 70/30 18U BID as this is easier regimen for her to allow decreased sticks; her last A1C in Nov 2017 was 10.5. On admission glucose was elevated to 400's which could be exacerbated by wound infection.  --CBG's q6hrs --Novolog 70/30 9U BID today -- Per diabetes coordinator, tomorrow while NPO will start:  - Q4H CBGs and SSI-S  - NPH 8 units in AM, hold 70/30 and resume after  surgery  Chronic multiple  decubitus ulcers: Patient with well healing sacral and heel ulcers obtained from stay in [place] hospital last year. Heel ulcer appears to be healing well; slight worsening in condition of sacral ulcer from initial presentation in Nov.  --wound care consult - appreciate assistance  Difficulty swallowing, per patients RN, no choking just not swallowing pills, suspect related to dementia - SLP eval  Dispo: Anticipated discharge in approximately 2-3 day(s).   LOS: 1 day   Althia Forts, MD 08/04/2016, 8:51 AM Pager: [phone]

## 2016-08-04 NOTE — Progress Notes (Signed)
Asked to assume care by Dr. Ranell PatrickNorris. Patient well known to me for previous R hip hemiarthroplasty on 07/07/16 for femoral neck fx. Has h/o severe dementia and uncontrolled diabetes. Admitted yesterday for erythema and drainage to surgical site. Patient seen and examined; husband at bedside. Patient confused and combative @ baseline. R hip incis with erythema and small amount of expressible drainage.   Assessment: SSI R hip. Uncontrolled DM.  Plan: -hold abx for now -needs image guided R hip joint aspiration by radiology to r/o deep infection. send fluid for:   1. STAT cell count with differential   2. STAT gram stain   3. Aerobic and anaerobic culture -after aspiration performed, it's ok to start IV abx -NPO after MN tonight -hold chemical DVT ppx after MN tonight -will plan for surgery tomorrow -plan discussed with husband

## 2016-08-04 NOTE — Procedures (Signed)
Successful right hip arthrocentesis under fluoro.  4cc serosanguinous fluid sent to lab.  JWatts MD

## 2016-08-04 NOTE — Progress Notes (Signed)
Made Dr. Samuella CotaSvalina aware of patient CBG 323, no new orders at this time, instructed to recheck CBG in the morning. Will continue to monitor and recheck CBG in the morning.

## 2016-08-04 NOTE — Progress Notes (Signed)
MD made aware about patient having problems swallowing meds. Will continue to monitor.

## 2016-08-04 NOTE — Progress Notes (Signed)
Initial Nutrition Assessment  DOCUMENTATION CODES:   Non-severe (moderate) malnutrition in context of chronic illness  INTERVENTION:   -MVI daily -Continue Glucerna Shake po BID, each supplement provides 220 kcal and 10 grams of protein  NUTRITION DIAGNOSIS:   Malnutrition related to chronic illness as evidenced by mild depletion of muscle mass, moderate depletions of muscle mass, mild depletion of body fat, moderate depletion of body fat.  GOAL:   Patient will meet greater than or equal to 90% of their needs  MONITOR:   PO intake, Supplement acceptance, Labs, Weight trends, Skin, I & O's  REASON FOR ASSESSMENT:   Malnutrition Screening Tool    ASSESSMENT:   Sharon Butler is a 83yo female with PMH of Alzheimer's Dementia, T2DM, and h/o of decubitus ulcers presenting to the ED today with one day history of swelling, redness and drainage from her recent right hip replacement surgical site (07/07/16).   Pt admitted with infected surgical wound.   Pt very lethargic at time of visit. Hx obtained from pt husband at bedside, who is very involved in her care. He reports that pt has a very good appetite at home, consuming 3 meals per day. Pt husband shares that pt consumes a mechanical soft diet at home and often chops meats or purees fruits (like homemade applesauce) for her. She did not eat breakfast today, secondary to undergoing right hip arthrocentesis prior to visit.   Pt husband reports that pt has progressively lost weight over the past 4-5 years, usually 1-2 pounds at each doctor's visit. Pt UBW is around 155# and has lost down to 107# over this period of time. Per husband, pt weighed 102# during prior hospitalization, however, with good nutrition was able to regain some of the lost weight.   Nutrition-Focused physical exam completed. Findings are mild to moderate fat depletion, mild to moderate muscle depletion, and no edema.   Pt to be NPO after midnight in anticipation for  surgery tomorrow.   Pt husband very knowledgeable about DM and nutrition therapy. Pt husband ordered lunch meal and ordered low carbohydrate, high protein foods, such as cottage cheese, Mal[place]urkey, and black coffee. Reviewed importance of good DM control and high protein intake to assist with healing. Pt follows with RD at [facility](Donna Plyler) and pt husband shares that they are eager to follow-up with her.  Labs reviewed: CBGS: 323-374.   Diet Order:  Diet Carb Modified Fluid consistency: Thin; Room service appropriate? Yes Diet NPO time specified  Skin:  Wound (see comment) (stage II sacrum, st I lt lateral foot, closed rt hip incision)  Last BM:  PTA  Height:   Ht Readings from Last 1 Encounters:  08/03/16 5\' 1"  (1.549 m)    Weight:   Wt Readings from Last 1 Encounters:  08/03/16 107 lb (48.5 kg)    Ideal Body Weight:  47.7 kg  BMI:  Body mass index is 20.22 kg/m.  Estimated Nutritional Needs:   Kcal:  1250-1450  Protein:  65-80 grams  Fluid:  >1.2 L  EDUCATION NEEDS:   Education needs addressed  Jaydence Vanyo A. Mayford KnifeWilliams, RD, LDN, CDE Pager: [phone] After hours Pager: [phone]

## 2016-08-04 NOTE — Consult Note (Signed)
WOC Nurse wound consult note Reason for Consult: sacral pressure injury Wound type: Stage 3 pressure injury x 2 coccyx area  Pressure Injury POA: Yes Measurement: Proximal 1.0cm x 2.5cm x 0.2cm; 100% pink, moist Distal 1.0cm x 1.0cm x 0.1cm; 100% pink, moist  Wound bed: see above Drainage (amount, consistency, odor) minimal, serosanguinous  Periwound: macerated  Dressing procedure/placement/frequency: Add air mattress for moisture management and pressure redistribution Continue foam dressings, if being soaked with urine frequently, switch to zinc based barrier cream only.  Chair pressure redistribution pad for when patient up out of bed, to be taken home with patient for use at home.  Discussed POC with patient's caregiverand bedside nurse.  Re consult if needed, will not follow at this time. Thanks  Argelio Granier M.D.[employer] MSN, RN,CWOCN, CNS [phone])

## 2016-08-04 NOTE — Progress Notes (Signed)
Pharmacy Antibiotic Note  Sharon [name] is a 83 y.o. female admitted on 08/03/2016 with infected hip.  Pharmacy has been consulted for Vancomycin dosing.  Plan: Vancomycin 750 mg iv Q 24 hours Follow up cultures, Scr, plan  Height: 5\' 1"  (154.9 cm) Weight: 107 lb (48.5 kg) IBW/kg (Calculated) : 47.8  Temp (24hrs), Avg:98.4 F (36.9 C), Min:98.4 F (36.9 C), Max:98.5 F (36.9 C)   Recent Labs Lab 08/03/16 1537 08/03/16 1610 08/03/16 1901 08/03/16 2239 08/04/16 0009 08/04/16 0230  WBC 12.0*  --   --   --   --  9.9  CREATININE 0.75  --   --   --   --   --   LATICACIDVEN  --  1.75 2.04* 1.2 1.1 1.1    Estimated Creatinine Clearance: 38.8 mL/min (by C-G formula based on SCr of 0.75 mg/dL).    Allergies  Allergen Reactions  . Codeine Hives and Itching    Hives itching  . Other Other (See Comments)    Anesthesia-trouble waking up  . Penicillins Other (See Comments)    Anaphylactic shock Has patient had a PCN reaction causing immediate rash, facial/tongue/throat swelling, SOB or lightheadedness with hypotension:YES Has patient had a PCN reaction causing severe rash involving mucus membranes or skin necrosis: NO Has patient had a PCN reaction that required hospitalization NO Has patient had a PCN reaction occurring within the last 10 years:NO If all of the above answers are "NO", then may proceed with Cephalosporin use.  . Shellfish Allergy Hives    hives    Thank you for allowing pharmacy to be a part of this patient's care. Okey RegalLisa Peder Allums, PharmD [phone] 08/04/2016 12:47 PM

## 2016-08-05 ENCOUNTER — Encounter (HOSPITAL_COMMUNITY): Payer: Self-pay | Admitting: Surgery

## 2016-08-05 ENCOUNTER — Inpatient Hospital Stay (HOSPITAL_COMMUNITY): Payer: Medicare HMO | Admitting: Anesthesiology

## 2016-08-05 ENCOUNTER — Encounter (HOSPITAL_COMMUNITY)
Admission: EM | Disposition: A | Discharge status: Home / Self Care | Payer: Self-pay | Source: Home / Self Care | Attending: Internal Medicine

## 2016-08-05 DIAGNOSIS — R262 Difficulty in walking, not elsewhere classified: Secondary | ICD-10-CM

## 2016-08-05 DIAGNOSIS — L89302 Pressure ulcer of unspecified buttock, stage 2: Secondary | ICD-10-CM

## 2016-08-05 DIAGNOSIS — T814XXD Infection following a procedure, subsequent encounter: Secondary | ICD-10-CM

## 2016-08-05 DIAGNOSIS — T8149XA Infection following a procedure, other surgical site, initial encounter: Secondary | ICD-10-CM | POA: Diagnosis present

## 2016-08-05 DIAGNOSIS — E440 Moderate protein-calorie malnutrition: Secondary | ICD-10-CM

## 2016-08-05 HISTORY — PX: INCISION AND DRAINAGE HIP: SHX1801

## 2016-08-05 HISTORY — PX: IRRIGATION AND DEBRIDEMENT ABSCESS: SHX5252

## 2016-08-05 LAB — GLUCOSE, CAPILLARY
GLUCOSE-CAPILLARY: 385 mg/dL — AB (ref 65–99)
GLUCOSE-CAPILLARY: 213 mg/dL — AB (ref 65–99)
GLUCOSE-CAPILLARY: 98 mg/dL (ref 65–99)
GLUCOSE-CAPILLARY: 275 mg/dL — AB (ref 65–99)
Glucose-Capillary: 215 mg/dL — ABNORMAL HIGH (ref 65–99)
Glucose-Capillary: 308 mg/dL — ABNORMAL HIGH (ref 65–99)
Glucose-Capillary: 389 mg/dL — ABNORMAL HIGH (ref 65–99)

## 2016-08-05 LAB — CBC
HCT: 37.4 % (ref 36.0–46.0)
HEMOGLOBIN: 12.4 g/dL (ref 12.0–15.0)
MCH: 29.2 pg (ref 26.0–34.0)
MCHC: 33.2 g/dL (ref 30.0–36.0)
MCV: 88.2 fL (ref 78.0–100.0)
PLATELETS: 202 10*3/uL (ref 150–400)
RBC: 4.24 MIL/uL (ref 3.87–5.11)
RDW: 12.9 % (ref 11.5–15.5)
WBC: 9.8 10*3/uL (ref 4.0–10.5)

## 2016-08-05 LAB — SYNOVIAL CELL COUNT + DIFF, W/ CRYSTALS
Crystals, Fluid: NONE SEEN
LYMPHOCYTES-SYNOVIAL FLD: 8 % (ref 0–20)
Monocyte-Macrophage-Synovial Fluid: 10 % — ABNORMAL LOW (ref 50–90)
Neutrophil, Synovial: 82 % — ABNORMAL HIGH (ref 0–25)
WBC, Synovial: 2850 /mm3 — ABNORMAL HIGH (ref 0–200)

## 2016-08-05 LAB — BASIC METABOLIC PANEL
ANION GAP: 8 (ref 5–15)
BUN: 11 mg/dL (ref 6–20)
CALCIUM: 9.2 mg/dL (ref 8.9–10.3)
CO2: 27 mmol/L (ref 22–32)
CREATININE: 0.73 mg/dL (ref 0.44–1.00)
Chloride: 106 mmol/L (ref 101–111)
GFR calc non Af Amer: >60 mL/min (ref >60)
Glucose, Bld: 184 mg/dL — ABNORMAL HIGH (ref 65–99)
Potassium: 3.1 mmol/L — ABNORMAL LOW (ref 3.5–5.1)
SODIUM: 141 mmol/L (ref 135–145)

## 2016-08-05 LAB — SURGICAL PCR SCREEN
MRSA, PCR: POSITIVE — AB
STAPHYLOCOCCUS AUREUS: POSITIVE — AB

## 2016-08-05 SURGERY — IRRIGATION AND DEBRIDEMENT HIP
Anesthesia: General | Site: Vulva | Laterality: Right

## 2016-08-05 MED ORDER — BUPIVACAINE HCL (PF) 0.5 % IJ SOLN
INTRAMUSCULAR | Status: AC
  Filled 2016-08-05: qty 30

## 2016-08-05 MED ORDER — GLYCOPYRROLATE 0.2 MG/ML IJ SOLN
INTRAMUSCULAR | Status: DC | PRN
  Administered 2016-08-05 (×2): .2 mg via INTRAVENOUS

## 2016-08-05 MED ORDER — ETOMIDATE 2 MG/ML IV SOLN
INTRAVENOUS | Status: DC | PRN
  Administered 2016-08-05: 8 mg via INTRAVENOUS

## 2016-08-05 MED ORDER — PROPOFOL 10 MG/ML IV BOLUS
INTRAVENOUS | Status: AC
Start: 2016-08-05 — End: 2016-08-05
  Filled 2016-08-05: qty 20

## 2016-08-05 MED ORDER — SODIUM CHLORIDE 0.9 % IR SOLN
Status: DC | PRN
  Administered 2016-08-05 (×3): 3000 mL

## 2016-08-05 MED ORDER — SODIUM CHLORIDE 0.9 % IV SOLN
INTRAVENOUS | Status: AC
  Administered 2016-08-05: 18:00:00 via INTRAVENOUS

## 2016-08-05 MED ORDER — LORAZEPAM BOLUS VIA INFUSION: 0.2500 mg | Freq: Once | INTRAVENOUS | Status: DC

## 2016-08-05 MED ORDER — FENTANYL CITRATE (PF) 100 MCG/2ML IJ SOLN: 25.0000 ug | INTRAMUSCULAR | Status: DC | PRN

## 2016-08-05 MED ORDER — LABETALOL HCL 5 MG/ML IV SOLN
INTRAVENOUS | Status: DC | PRN
  Administered 2016-08-05 (×2): 5 mg via INTRAVENOUS

## 2016-08-05 MED ORDER — LABETALOL HCL 5 MG/ML IV SOLN
INTRAVENOUS | Status: AC
  Filled 2016-08-05: qty 4

## 2016-08-05 MED ORDER — LIDOCAINE HCL (CARDIAC) 20 MG/ML IV SOLN
INTRAVENOUS | Status: DC | PRN
  Administered 2016-08-05: 100 mg via INTRAVENOUS

## 2016-08-05 MED ORDER — PHENYLEPHRINE HCL 10 MG/ML IJ SOLN
INTRAMUSCULAR | Status: DC | PRN
  Administered 2016-08-05: 50 ug/min via INTRAVENOUS

## 2016-08-05 MED ORDER — VANCOMYCIN HCL IN DEXTROSE 1-5 GM/200ML-% IV SOLN
1000.0000 mg | INTRAVENOUS | Status: DC
  Filled 2016-08-05: qty 200

## 2016-08-05 MED ORDER — CHLORHEXIDINE GLUCONATE 4 % EX LIQD
60.0000 mL | Freq: Once | CUTANEOUS | Status: DC
  Filled 2016-08-05: qty 60

## 2016-08-05 MED ORDER — ONDANSETRON HCL 4 MG/2ML IJ SOLN
INTRAMUSCULAR | Status: DC | PRN
  Administered 2016-08-05: 4 mg via INTRAVENOUS

## 2016-08-05 MED ORDER — FENTANYL CITRATE (PF) 100 MCG/2ML IJ SOLN
INTRAMUSCULAR | Status: DC | PRN
  Administered 2016-08-05: 25 ug via INTRAVENOUS
  Administered 2016-08-05: 50 ug via INTRAVENOUS
  Administered 2016-08-05 (×2): 25 ug via INTRAVENOUS

## 2016-08-05 MED ORDER — ONDANSETRON HCL 4 MG/2ML IJ SOLN
INTRAMUSCULAR | Status: AC
  Filled 2016-08-05: qty 2

## 2016-08-05 MED ORDER — KETOROLAC TROMETHAMINE 30 MG/ML IJ SOLN
INTRAMUSCULAR | Status: AC
  Filled 2016-08-05: qty 1

## 2016-08-05 MED ORDER — POVIDONE-IODINE 10 % EX SWAB: 2.0000 "application " | Freq: Once | CUTANEOUS | Status: DC

## 2016-08-05 MED ORDER — FENTANYL CITRATE (PF) 100 MCG/2ML IJ SOLN
INTRAMUSCULAR | Status: AC
  Filled 2016-08-05: qty 2

## 2016-08-05 MED ORDER — ETOMIDATE 2 MG/ML IV SOLN
INTRAVENOUS | Status: AC
  Filled 2016-08-05: qty 10

## 2016-08-05 MED ORDER — INSULIN ASPART PROT & ASPART (70-30 MIX) 100 UNIT/ML ~~LOC~~ SUSP: 9.0000 [IU] | Freq: Two times a day (BID) | SUBCUTANEOUS | Status: DC

## 2016-08-05 MED ORDER — ACETAMINOPHEN 325 MG PO TABS
650.0000 mg | ORAL_TABLET | Freq: Four times a day (QID) | ORAL | Status: DC | PRN
  Administered 2016-08-05 – 2016-08-08 (×2): 650 mg via ORAL
  Filled 2016-08-05 (×5): qty 2

## 2016-08-05 MED ORDER — CHLORHEXIDINE GLUCONATE CLOTH 2 % EX PADS
6.0000 | MEDICATED_PAD | Freq: Every day | CUTANEOUS | Status: DC
  Administered 2016-08-05: 6 via TOPICAL

## 2016-08-05 MED ORDER — MEPERIDINE HCL 25 MG/ML IJ SOLN: 6.2500 mg | INTRAMUSCULAR | Status: DC | PRN

## 2016-08-05 MED ORDER — ONDANSETRON HCL 4 MG/2ML IJ SOLN: 4.0000 mg | Freq: Once | INTRAMUSCULAR | Status: DC | PRN

## 2016-08-05 MED ORDER — SUGAMMADEX SODIUM 200 MG/2ML IV SOLN
INTRAVENOUS | Status: AC
  Filled 2016-08-05: qty 2

## 2016-08-05 MED ORDER — ONDANSETRON HCL 4 MG/2ML IJ SOLN: 4.0000 mg | Freq: Four times a day (QID) | INTRAMUSCULAR | Status: DC | PRN

## 2016-08-05 MED ORDER — SUGAMMADEX SODIUM 200 MG/2ML IV SOLN
INTRAVENOUS | Status: DC | PRN
  Administered 2016-08-05: 100 mg via INTRAVENOUS

## 2016-08-05 MED ORDER — ROCURONIUM BROMIDE 100 MG/10ML IV SOLN
INTRAVENOUS | Status: DC | PRN
  Administered 2016-08-05: 50 mg via INTRAVENOUS

## 2016-08-05 MED ORDER — PROPOFOL 10 MG/ML IV BOLUS
INTRAVENOUS | Status: DC | PRN
  Administered 2016-08-05: 20 mg via INTRAVENOUS

## 2016-08-05 MED ORDER — MUPIROCIN 2 % EX OINT
1.0000 "application " | TOPICAL_OINTMENT | Freq: Two times a day (BID) | CUTANEOUS | Status: AC
  Administered 2016-08-05 – 2016-08-08 (×8): 1 via NASAL
  Filled 2016-08-05 (×2): qty 22

## 2016-08-05 MED ORDER — INSULIN ASPART PROT & ASPART (70-30 MIX) 100 UNIT/ML ~~LOC~~ SUSP
12.0000 [IU] | Freq: Two times a day (BID) | SUBCUTANEOUS | Status: DC
  Administered 2016-08-05: 12 [IU] via SUBCUTANEOUS
  Filled 2016-08-05: qty 10

## 2016-08-05 MED ORDER — METOCLOPRAMIDE HCL 5 MG PO TABS: 5.0000 mg | ORAL_TABLET | Freq: Three times a day (TID) | ORAL | Status: DC | PRN

## 2016-08-05 MED ORDER — ONDANSETRON HCL 4 MG PO TABS: 4.0000 mg | ORAL_TABLET | Freq: Four times a day (QID) | ORAL | Status: DC | PRN

## 2016-08-05 MED ORDER — LORAZEPAM 2 MG/ML IJ SOLN
0.2500 mg | Freq: Once | INTRAMUSCULAR | Status: AC
  Administered 2016-08-05: 0.25 mg via INTRAVENOUS
  Filled 2016-08-05: qty 1

## 2016-08-05 MED ORDER — ACETAMINOPHEN 650 MG RE SUPP: 650.0000 mg | Freq: Four times a day (QID) | RECTAL | Status: DC | PRN

## 2016-08-05 MED ORDER — METOCLOPRAMIDE HCL 5 MG/ML IJ SOLN: 5.0000 mg | Freq: Three times a day (TID) | INTRAMUSCULAR | Status: DC | PRN

## 2016-08-05 MED ORDER — ENOXAPARIN SODIUM 30 MG/0.3ML ~~LOC~~ SOLN
30.0000 mg | SUBCUTANEOUS | Status: DC
  Administered 2016-08-06 – 2016-08-12 (×7): 30 mg via SUBCUTANEOUS
  Filled 2016-08-05 (×7): qty 0.3

## 2016-08-05 MED ORDER — 0.9 % SODIUM CHLORIDE (POUR BTL) OPTIME
TOPICAL | Status: DC | PRN
  Administered 2016-08-05: 1000 mL

## 2016-08-05 MED ORDER — LACTATED RINGERS IV SOLN
INTRAVENOUS | Status: DC
  Administered 2016-08-05: 12:00:00 via INTRAVENOUS

## 2016-08-05 SURGICAL SUPPLY — 45 items
BLADE SURG ROTATE 9660 (MISCELLANEOUS) ×3 IMPLANT
CONT SPEC 4OZ CLIKSEAL STRL BL (MISCELLANEOUS) ×3 IMPLANT
COVER SURGICAL LIGHT HANDLE (MISCELLANEOUS) ×3 IMPLANT
DRAIN PENROSE 1/2X12 LTX STRL (WOUND CARE) ×3 IMPLANT
DRAPE IMP U-DRAPE 54X76 (DRAPES) ×6 IMPLANT
DRAPE STERI IOBAN 125X83 (DRAPES) ×3 IMPLANT
DRAPE U-SHAPE 47X51 STRL (DRAPES) ×3 IMPLANT
DRSG PAD ABDOMINAL 8X10 ST (GAUZE/BANDAGES/DRESSINGS) ×3 IMPLANT
ELECT BLADE 4.0 EZ CLEAN MEGAD (MISCELLANEOUS) ×3
ELECT REM PT RETURN 9FT ADLT (ELECTROSURGICAL) ×3
ELECTRODE BLDE 4.0 EZ CLN MEGD (MISCELLANEOUS) ×2 IMPLANT
ELECTRODE REM PT RTRN 9FT ADLT (ELECTROSURGICAL) ×2 IMPLANT
GLOVE BIO SURGEON STRL SZ8.5 (GLOVE) ×6 IMPLANT
GLOVE BIOGEL PI IND STRL 8.5 (GLOVE) ×2 IMPLANT
GLOVE BIOGEL PI INDICATOR 8.5 (GLOVE) ×1
GOWN STRL REUS W/ TWL LRG LVL3 (GOWN DISPOSABLE) ×4 IMPLANT
GOWN STRL REUS W/TWL 2XL LVL3 (GOWN DISPOSABLE) ×3 IMPLANT
GOWN STRL REUS W/TWL LRG LVL3 (GOWN DISPOSABLE) ×2
HANDPIECE INTERPULSE COAX TIP (DISPOSABLE) ×1
HOOD PEEL AWAY FACE SHEILD DIS (HOOD) ×6 IMPLANT
KIT BASIN OR (CUSTOM PROCEDURE TRAY) ×3 IMPLANT
KIT ROOM TURNOVER OR (KITS) ×3 IMPLANT
MANIFOLD NEPTUNE II (INSTRUMENTS) ×3 IMPLANT
MARKER SKIN DUAL TIP RULER LAB (MISCELLANEOUS) ×3 IMPLANT
NEEDLE SPNL 18GX3.5 QUINCKE PK (NEEDLE) ×3 IMPLANT
NS IRRIG 1000ML POUR BTL (IV SOLUTION) ×3 IMPLANT
PACK TOTAL JOINT (CUSTOM PROCEDURE TRAY) ×3 IMPLANT
PACK UNIVERSAL I (CUSTOM PROCEDURE TRAY) ×3 IMPLANT
PAD ARMBOARD 7.5X6 YLW CONV (MISCELLANEOUS) ×6 IMPLANT
SET HNDPC FAN SPRY TIP SCT (DISPOSABLE) ×2 IMPLANT
SUCTION FRAZIER HANDLE 10FR (MISCELLANEOUS) ×1
SUCTION TUBE FRAZIER 10FR DISP (MISCELLANEOUS) ×2 IMPLANT
SUT ETHILON 2 0 PSLX (SUTURE) ×6 IMPLANT
SUT ETHILON 3 0 FSL (SUTURE) ×3 IMPLANT
SUT MNCRL AB 3-0 PS2 18 (SUTURE) ×3 IMPLANT
SUT MON AB 2-0 CT1 36 (SUTURE) ×6 IMPLANT
SUT VIC AB 1 CT1 27 (SUTURE) ×1
SUT VIC AB 1 CT1 27XBRD ANBCTR (SUTURE) ×2 IMPLANT
SUT VIC AB 2-0 CT1 27 (SUTURE) ×1
SUT VIC AB 2-0 CT1 TAPERPNT 27 (SUTURE) ×2 IMPLANT
SUT VLOC 180 0 24IN GS25 (SUTURE) ×3 IMPLANT
SYR 50ML LL SCALE MARK (SYRINGE) ×3 IMPLANT
TOWEL OR 17X24 6PK STRL BLUE (TOWEL DISPOSABLE) ×3 IMPLANT
TOWEL OR 17X26 10 PK STRL BLUE (TOWEL DISPOSABLE) ×3 IMPLANT
TRAY FOLEY CATH 16FR SILVER (SET/KITS/TRAYS/PACK) ×3 IMPLANT

## 2016-08-05 NOTE — Progress Notes (Addendum)
MD paged to clarify NPH orders CBG 98. Pt NPO for OR  9:31 AM Verbal order from Dr. Laural BenesJohnson to hold NPH

## 2016-08-05 NOTE — Anesthesia Postprocedure Evaluation (Signed)
Anesthesia Post Note  Patient: Sharon Butler  Procedure(s) Performed: Procedure(s) (LRB): IRRIGATION AND DEBRIDEMENT HIP (Right) IRRIGATION AND DEBRIDEMENT LABIAL ABSCESS (Right)  Patient location during evaluation: PACU Anesthesia Type: General Level of consciousness: sedated Pain management: satisfactory to patient Vital Signs Assessment: post-procedure vital signs reviewed and stable Respiratory status: spontaneous breathing Cardiovascular status: stable Anesthetic complications: no       Last Vitals:  Vitals:   08/05/16 1520 08/05/16 1530  BP: (!) 128/55 (!) 127/51  Pulse: 63   Resp: 15   Temp:  36.7 C    Last Pain:  Vitals:   08/05/16 1505  TempSrc:   PainSc: Asleep                 Shloka Baldridge EDWARD

## 2016-08-05 NOTE — Care Management Note (Addendum)
Case Management Note  Patient Details  Name: Sharon Butler MRN: [id number] Date of Birth: Dec 06, 1928  Subjective/Objective:                 Patient from home with daughter, moved from [place] in the past year. Attempt to [facility] November however patient had a medicare policy that was from [place] and could not get HH covered. [facility] to see if [facility] would be [facility], [facility] Patient admitted for infection to site of hip repair. Was DC'd mid Dec after hip surgery.  Addendum: [facility].   Action/Plan:  CM will continue to follow. Will offer choice prior to referral for care if DCing home.   Expected Discharge Date:                  Expected Discharge Plan:  Home w Home Health Services  In-House Referral:     Discharge planning Services  CM Consult  Post Acute Care Choice:    Choice offered to:     DME Arranged:    DME Agency:     HH Arranged:    HH Agency:     Status of Service:  In process, will continue to follow  If discussed at Long Length of Stay Meetings, dates discussed:    Additional Comments:  Lawerance SabalDebbie Keleigh Kazee, RN 08/05/2016, 12:20 PM

## 2016-08-05 NOTE — Consult Note (Signed)
Reason for Consult:  Left labial abscess Referring Physician: Dr. Zenovia Jordan  Sharon Butler is an 83 y.o. female.  HPI:  83 y/o from SNF who had  Right hip replacement on 07/07/16, by Dr. Lyla Glassing.  She presents with right hip redness and drainage. She was readmitted, placed on antibiotics.Dr. Pascal Lux, IR, did a right hip arthrocentesis under fluoro.  4cc serosanguinous fluid sent to lab.No growth so far. She has a diagnosis of right hip wound dehiscence was taken to the OR today for: IRRIGATION AND DEBRIDEMENT HIP WITH POSSIBLE HEAD BALL EXCHANGE .While prepping the patient for surgery and placement of a foley catheter she was found to have a large left labial abscess.  Dr. Georgette Dover was consulted intraoperatively and was able to see the patient after the orthopedic portion of procedure was complete.  See Op Note.  Past Medical History:  Diagnosis Date  . Alzheimer's dementia    "dx'd 05/2015; final stages" (06/15/2016)  . Complication of anesthesia    "problems waking up from gallbladder OR in 1978"  . COPD (chronic obstructive pulmonary disease) ([place])   . High cholesterol   . History of blood transfusion 1996   "related to appendix OR"  . Infected surgical wound 07/2016   rt hip  . Type II diabetes mellitus ([place]) 1998    Past Surgical History:  Procedure Laterality Date  . ANTERIOR APPROACH HEMI HIP ARTHROPLASTY Right 07/07/2016   Procedure: ANTERIOR APPROACH HEMI HIP ARTHROPLASTY;  Surgeon: Rod Can, MD;  Location: [facility];  Service: Orthopedics;  Laterality: Right;  . APPENDECTOMY  1986  . CATARACT EXTRACTION W/ INTRAOCULAR LENS  IMPLANT, BILATERAL Bilateral   . CHOLECYSTECTOMY  1978  . DILATION AND CURETTAGE OF UTERUS      History reviewed. No pertinent family history.  Social History:  reports that she has never smoked. She has never used smokeless tobacco. She reports that she does not drink alcohol or use drugs.  Allergies:  Allergies  Allergen Reactions  . Codeine  Hives and Itching    Hives itching  . Other Other (See Comments)    Anesthesia-trouble waking up  . Penicillins Other (See Comments)    Anaphylactic shock Has patient had a PCN reaction causing immediate rash, facial/tongue/throat swelling, SOB or lightheadedness with hypotension:YES Has patient had a PCN reaction causing severe rash involving mucus membranes or skin necrosis: NO Has patient had a PCN reaction that required hospitalization NO Has patient had a PCN reaction occurring within the last 10 years:NO If all of the above answers are "NO", then may proceed with Cephalosporin use.  . Shellfish Allergy Hives    hives    Medications:  Prior to Admission:  Prescriptions Prior to Admission  Medication Sig Dispense Refill Last Dose  . alendronate (FOSAMAX) 70 MG tablet Take 1 tablet (70 mg total) by mouth once a week. Take with a full glass of water on an empty stomach. (Patient taking differently: Take 70 mg by mouth once a week. Take with a full glass of water on an empty stomach. Take on Thursdays) 30 tablet 0 Past Week at Unknown time  . Ascorbic Acid (VITA-C PO) Take 1 tablet by mouth daily as needed (for immune).   Past Week at Unknown time  . calcium-vitamin D (OSCAL WITH D) 500-200 MG-UNIT tablet Take 2 tablets by mouth daily. 60 tablet 2 08/02/2016 at Unknown time  . CINNAMON PO Take 2 tablets by mouth 2 (two) times daily.    08/02/2016 at Unknown time  .  enoxaparin (LOVENOX) 30 MG/0.3ML injection Inject 0.3 mLs (30 mg total) into the skin daily. 30 Syringe 0 08/02/2016 at Unknown time  . feeding supplement, GLUCERNA SHAKE, (GLUCERNA SHAKE) LIQD Take 237 mLs by mouth 2 (two) times daily between meals. (Patient taking differently: Take 237 mLs by mouth See admin instructions. ) 60 Can 2 08/03/2016 at Unknown time  . ibuprofen (ADVIL,MOTRIN) 200 MG tablet Take 200-400 mg by mouth every 6 (six) hours as needed for headache or moderate pain.   Past Week at Unknown time  . insulin aspart  protamine- aspart (NOVOLOG MIX 70/30) (70-30) 100 UNIT/ML injection Inject 0.18 mLs (18 Units total) into the skin 2 (two) times daily with a meal. 10 mL 1 08/03/2016 at Unknown time  . methocarbamol (ROBAXIN) 500 MG tablet Take 1 tablet (500 mg total) by mouth every 6 (six) hours as needed for muscle spasms. 30 tablet 0 Past Week at Unknown time  . mupirocin ointment (BACTROBAN) 2 % Place 1 application into the nose 2 (two) times daily. For 5 days 22 g 0 Past Week at Unknown time  . Omega 3-6-9 Fatty Acids (OMEGA 3-6-9 COMPLEX) CAPS Take 1 capsule by mouth daily.   Past Week at Unknown time  . simvastatin (ZOCOR) 10 MG tablet Take 10 mg by mouth daily.   Past Week at Unknown time  . traMADol-acetaminophen (ULTRACET) 37.5-325 MG tablet Take 1-2 tablets by mouth every 6 (six) hours as needed for moderate pain or severe pain. 28 tablet 0 Past Week at Unknown time  . polyethylene glycol (MIRALAX / GLYCOLAX) packet Take 17 g by mouth daily. (Patient not taking: Reported on 08/03/2016) 14 each 0 Not Taking at Unknown time   Scheduled: . [MAR Hold] calcium-vitamin D  2 tablet Oral Q breakfast  . chlorhexidine  60 mL Topical Once  . [MAR Hold] Chlorhexidine Gluconate Cloth  6 each Topical Q0600  . [MAR Hold] feeding supplement (ENSURE ENLIVE)  237 mL Oral BID BM  . [MAR Hold] feeding supplement (GLUCERNA SHAKE)  237 mL Oral BID BM  . [MAR Hold] Gerhardt's butt cream   Topical BID  . [MAR Hold] heparin subcutaneous  5,000 Units Subcutaneous Once  . [MAR Hold] insulin aspart  0-9 Units Subcutaneous Q4H  . [MAR Hold] lidocaine (PF)  2 mL Intradermal Once  . [MAR Hold] multivitamins with iron  1 tablet Oral Daily  . [MAR Hold] mupirocin ointment  1 application Nasal BID  . [MAR Hold] omega-3 acid ethyl esters  1 g Oral Daily  . povidone-iodine  2 application Topical Once  . [MAR Hold] simvastatin  10 mg Oral q1800  . [MAR Hold] vancomycin  750 mg Intravenous Q24H   Continuous: . sodium chloride    .  lactated ringers 10 mL/hr at 08/05/16 1156   XBJ:YNWGNFAO (SUBLIMAZE) injection, meperidine (DEMEROL) injection, ondansetron (ZOFRAN) IV, [MAR Hold] traMADol Anti-infectives    Start     Dose/Rate Route Frequency Ordered Stop   08/05/16 1215  vancomycin (VANCOCIN) IVPB 1000 mg/200 mL premix  Status:  Discontinued     1,000 mg 200 mL/hr over 60 Minutes Intravenous On call to O.R. 08/05/16 0723 08/05/16 0725   08/04/16 1400  [MAR Hold]  vancomycin (VANCOCIN) IVPB 750 mg/150 ml premix     (MAR Hold since 08/05/16 1145)   750 mg 150 mL/hr over 60 Minutes Intravenous Every 24 hours 08/04/16 1254     08/04/16 1315  levofloxacin (LEVAQUIN) IVPB 500 mg     500 mg  100 mL/hr over 60 Minutes Intravenous  Once 08/04/16 1301 08/04/16 1512      Results for orders placed or performed during the hospital encounter of 08/03/16 (from the past 48 hour(s))  Comprehensive metabolic panel     Status: Abnormal   Collection Time: 08/03/16  3:37 PM  Result Value Ref Range   Sodium 133 (L) 135 - 145 mmol/L   Potassium 3.8 3.5 - 5.1 mmol/L   Chloride 99 (L) 101 - 111 mmol/L   CO2 25 22 - 32 mmol/L   Glucose, Bld 425 (H) 65 - 99 mg/dL   BUN 21 (H) 6 - 20 mg/dL   Creatinine, Ser 0.75 0.44 - 1.00 mg/dL   Calcium 9.5 8.9 - 10.3 mg/dL   Total Protein 6.7 6.5 - 8.1 g/dL   Albumin 2.8 (L) 3.5 - 5.0 g/dL   AST 16 15 - 41 U/L   ALT 16 14 - 54 U/L   Alkaline Phosphatase 122 38 - 126 U/L   Total Bilirubin 0.4 0.3 - 1.2 mg/dL   GFR calc non Af Amer >60 >60 mL/min   GFR calc Af Amer >60 >60 mL/min    Comment: (NOTE) The eGFR has been calculated using the CKD EPI equation. This calculation has not been validated in all clinical situations. eGFR's persistently <60 mL/min signify possible Chronic Kidney Disease.    Anion gap 9 5 - 15  CBC     Status: Abnormal   Collection Time: 08/03/16  3:37 PM  Result Value Ref Range   WBC 12.0 (H) 4.0 - 10.5 K/uL   RBC 4.30 3.87 - 5.11 MIL/uL   Hemoglobin 13.1 12.0 - 15.0  g/dL   HCT 37.9 36.0 - 46.0 %   MCV 88.1 78.0 - 100.0 fL   MCH 30.5 26.0 - 34.0 pg   MCHC 34.6 30.0 - 36.0 g/dL   RDW 12.9 11.5 - 15.5 %   Platelets 198 150 - 400 K/uL  I-Stat CG4 Lactic Acid, ED     Status: None   Collection Time: 08/03/16  4:10 PM  Result Value Ref Range   Lactic Acid, Venous 1.75 0.5 - 1.9 mmol/L  Blood culture (routine x 2)     Status: None (Preliminary result)   Collection Time: 08/03/16  6:27 PM  Result Value Ref Range   Specimen Description BLOOD RIGHT ANTECUBITAL    Special Requests BOTTLES DRAWN AEROBIC AND ANAEROBIC 5CC    Culture NO GROWTH < 24 HOURS    Report Status PENDING   I-Stat CG4 Lactic Acid, ED     Status: Abnormal   Collection Time: 08/03/16  7:01 PM  Result Value Ref Range   Lactic Acid, Venous 2.04 (HH) 0.5 - 1.9 mmol/L   Comment NOTIFIED PHYSICIAN   Lactic acid, plasma     Status: None   Collection Time: 08/03/16 10:39 PM  Result Value Ref Range   Lactic Acid, Venous 1.2 0.5 - 1.9 mmol/L  CBG monitoring, ED     Status: Abnormal   Collection Time: 08/03/16 11:15 PM  Result Value Ref Range   Glucose-Capillary 307 (H) 65 - 99 mg/dL  Lactic acid, plasma     Status: None   Collection Time: 08/04/16 12:09 AM  Result Value Ref Range   Lactic Acid, Venous 1.1 0.5 - 1.9 mmol/L  Glucose, capillary     Status: Abnormal   Collection Time: 08/04/16 12:17 AM  Result Value Ref Range   Glucose-Capillary 323 (H) 65 - 99 mg/dL  CBC     Status: None   Collection Time: 08/04/16  2:30 AM  Result Value Ref Range   WBC 9.9 4.0 - 10.5 K/uL   RBC 4.28 3.87 - 5.11 MIL/uL   Hemoglobin 12.6 12.0 - 15.0 g/dL   HCT 37.5 36.0 - 46.0 %   MCV 87.6 78.0 - 100.0 fL   MCH 29.4 26.0 - 34.0 pg   MCHC 33.6 30.0 - 36.0 g/dL   RDW 12.9 11.5 - 15.5 %   Platelets 197 150 - 400 K/uL  Lactic acid, plasma     Status: None   Collection Time: 08/04/16  2:30 AM  Result Value Ref Range   Lactic Acid, Venous 1.1 0.5 - 1.9 mmol/L  Glucose, capillary     Status: Abnormal    Collection Time: 08/04/16  7:07 AM  Result Value Ref Range   Glucose-Capillary 374 (H) 65 - 99 mg/dL  Body fluid culture     Status: None (Preliminary result)   Collection Time: 08/04/16 10:41 AM  Result Value Ref Range   Specimen Description FLUID RIGHT HIP    Special Requests NONE    Gram Stain      RARE WBC PRESENT, PREDOMINANTLY PMN NO ORGANISMS SEEN    Culture NO GROWTH 1 DAY    Report Status PENDING   Synovial cell count + diff, w/ crystals     Status: Abnormal   Collection Time: 08/04/16 10:41 AM  Result Value Ref Range   Color, Synovial RED (A) YELLOW   Appearance-Synovial BLOODY (A) CLEAR   Crystals, Fluid NO CRYSTALS SEEN    WBC, Synovial UNABLE TO PERFORM COUNT DUE TO CLOT IN SPECIMEN 0 - 200 /cu mm   Neutrophil, Synovial 95 (H) 0 - 25 %   Lymphocytes-Synovial Fld 3 0 - 20 %   Monocyte-Macrophage-Synovial Fluid 1 (L) 50 - 90 %   Eosinophils-Synovial 1 0 - 1 %  Glucose, capillary     Status: Abnormal   Collection Time: 08/04/16 12:22 PM  Result Value Ref Range   Glucose-Capillary 241 (H) 65 - 99 mg/dL  Surgical pcr screen     Status: Abnormal   Collection Time: 08/05/16  1:22 AM  Result Value Ref Range   MRSA, PCR POSITIVE (A) NEGATIVE    Comment: RESULT CALLED TO, READ BACK BY AND VERIFIED WITH: L LEWIS,RN _0  08/05/16 MKELLY,MLT    Staphylococcus aureus POSITIVE (A) NEGATIVE    Comment:        The Xpert SA Assay (FDA approved for NASAL specimens in patients over 62 years of age), is one component of a comprehensive surveillance program.  Test performance has been validated by [facility] for patients greater than or equal to 44 year old. It is not intended to diagnose infection nor to guide or monitor treatment.   Glucose, capillary     Status: Abnormal   Collection Time: 08/05/16  1:35 AM  Result Value Ref Range   Glucose-Capillary 385 (H) 65 - 99 mg/dL  Glucose, capillary     Status: Abnormal   Collection Time: 08/05/16  5:34 AM  Result Value  Ref Range   Glucose-Capillary 213 (H) 65 - 99 mg/dL  Basic metabolic panel     Status: Abnormal   Collection Time: 08/05/16  7:04 AM  Result Value Ref Range   Sodium 141 135 - 145 mmol/L   Potassium 3.1 (L) 3.5 - 5.1 mmol/L   Chloride 106 101 - 111 mmol/L   CO2 27 22 -  32 mmol/L   Glucose, Bld 184 (H) 65 - 99 mg/dL   BUN 11 6 - 20 mg/dL   Creatinine, Ser 0.73 0.44 - 1.00 mg/dL   Calcium 9.2 8.9 - 10.3 mg/dL   GFR calc non Af Amer >60 >60 mL/min   GFR calc Af Amer >60 >60 mL/min    Comment: (NOTE) The eGFR has been calculated using the CKD EPI equation. This calculation has not been validated in all clinical situations. eGFR's persistently <60 mL/min signify possible Chronic Kidney Disease.    Anion gap 8 5 - 15  CBC     Status: None   Collection Time: 08/05/16  7:04 AM  Result Value Ref Range   WBC 9.8 4.0 - 10.5 K/uL   RBC 4.24 3.87 - 5.11 MIL/uL   Hemoglobin 12.4 12.0 - 15.0 g/dL   HCT 37.4 36.0 - 46.0 %   MCV 88.2 78.0 - 100.0 fL   MCH 29.2 26.0 - 34.0 pg   MCHC 33.2 30.0 - 36.0 g/dL   RDW 12.9 11.5 - 15.5 %   Platelets 202 150 - 400 K/uL  Glucose, capillary     Status: None   Collection Time: 08/05/16  9:02 AM  Result Value Ref Range   Glucose-Capillary 98 65 - 99 mg/dL  Glucose, capillary     Status: Abnormal   Collection Time: 08/05/16 11:31 AM  Result Value Ref Range   Glucose-Capillary 215 (H) 65 - 99 mg/dL  Glucose, capillary     Status: Abnormal   Collection Time: 08/05/16  2:51 PM  Result Value Ref Range   Glucose-Capillary 275 (H) 65 - 99 mg/dL    Dg Fluoro Guided Needle Plc Aspiration/injection Loc  Result Date: 08/04/2016 CLINICAL DATA:  Cellulitis over recent right hip arthroplasty. EXAM: RIGHT HIP ASPIRATION UNDER FLUOROSCOPY FLUOROSCOPY TIME:  Fluoroscopy Time:  24 SECONDS Radiation Exposure Index (if provided by the fluoroscopic device): 0.5 MGY Number of Acquired Spot Images: 0 PROCEDURE: The patient's right hip hemiarthroplasty incision showed  indurated cellulitic appearing skin peripherally which was avoided. Overlying skin prepped with Betadine, draped in the usual sterile fashion, and infiltrated locally with buffered Lidocaine. 20 gauge spinal needle advanced to prosthetic femoral head neck junction with dry tap. The pseudo capsule was targeted more inferiorly and the needle tip was advanced to the metal trochanter where there was spontaneous return of 4 cc serosanguineous fluid. IMPRESSION: Technically successful right hip aspiration under fluoroscopy. 4 cc of serosanguineous fluid was sent to the lab. Electronically Signed   By: Monte Fantasia M.D.   On: 08/04/2016 11:01    Review of Systems  Unable to perform ROS: Other   Blood pressure (!) 130/57, pulse 65, temperature 98 F (36.7 C), resp. rate (!) 8, height _0  (1.549 m), weight 48.5 kg (107 lb), SpO2 94 %. Physical Exam Elderly female - intubated in OR Left labia - 3 x 2 cm firm fluctuant area with two pinpoint openings.  Purulent drainage  Assessment/Plan: Left labial abscess S/p right hip hemi arthroplasty 07/07/16, for displace right femoral head neck fracture Alzheimer's dementia Type II diabetes RLE decubitus  Plan:  Pt is s/p I&D of her left labial abscess.  We will follow with you. Currently on Levofloxacin and Vancomycin.  No growth so far on the right hip arthrocentesis.   Sharon Butler,Sharon Butler 08/05/2016, 2:58 PM

## 2016-08-05 NOTE — Brief Op Note (Signed)
08/05/2016  2:15 PM  PATIENT:  Sharon Butler  83 y.o. female  PRE-OPERATIVE DIAGNOSIS:  Right Hip Wound Dehiscence  POST-OPERATIVE DIAGNOSIS:  Superficial SSI  PROCEDURE:  Procedure(s): IRRIGATION AND DEBRIDEMENT HIP WITH POSSIBLE HEAD BALL EXCHANGE (Right)  SURGEON:  Surgeon(s) and Role:    * Samson FredericBrian Yosef Krogh, MD - Primary  PHYSICIAN ASSISTANT: none  ASSISTANTS: justin queen, rnfa   ANESTHESIA:   general  EBL:  Total I/O In: 334.2 [I.V.:334.2] Out: 950 [Urine:750; Blood:200]  BLOOD ADMINISTERED:none  DRAINS: prevena wound VAC   LOCAL MEDICATIONS USED:  NONE  SPECIMEN:  1. Right hip superficial wound swab for culture 2. Right hip synovial fluid for cell count and culture  DISPOSITION OF SPECIMEN:  micro  COUNTS:  YES  TOURNIQUET:  * No tourniquets in log *  DICTATION: .Other Dictation: Dictation Number [id number]  PLAN OF CARE: Admit to inpatient   PATIENT DISPOSITION:  PACU - hemodynamically stable.   Delay start of Pharmacological VTE agent (>24hrs) due to surgical blood loss or risk of bleeding: no

## 2016-08-05 NOTE — H&P (View-Only) (Signed)
ORTHO CONSULT NOTE  08/03/2016 7:40 PM      []Hide copied text []Hover for attribution information Sharon Butler is an 83 y.o. female.   Chief Complaint: right hip redness and drainage HPI: 83 yo female who presents one month after right anterior approach hip hemi-arthroplasty with a several day history of increasing erythema and drainage from her incision.  No fevers or chills.  She is able to ambulate without significant pain on the hip.      Past Medical History:  Diagnosis Date  . Alzheimer's dementia    "dx'd 05/2015; final stages" (06/15/2016)  . Complication of anesthesia    "problems waking up from gallbladder OR in 1978"  . COPD (chronic obstructive pulmonary disease) ([facility])   . High cholesterol   . History of blood transfusion 1996   "related to appendix OR"  . Type II diabetes mellitus ([facility]) 1998         Past Surgical History:  Procedure Laterality Date  . ANTERIOR APPROACH HEMI HIP ARTHROPLASTY Right 07/07/2016   Procedure: ANTERIOR APPROACH HEMI HIP ARTHROPLASTY;  Surgeon: Brian Swinteck, MD;  Location: [facility];  Service: Orthopedics;  Laterality: Right;  . APPENDECTOMY  1986  . CATARACT EXTRACTION W/ INTRAOCULAR LENS  IMPLANT, BILATERAL Bilateral   . CHOLECYSTECTOMY  1978  . DILATION AND CURETTAGE OF UTERUS      No family history on file. Social History:  reports that she has never smoked. She has never used smokeless tobacco. She reports that she does not drink alcohol or use drugs.  Allergies:       Allergies  Allergen Reactions  . Codeine Hives and Itching    Hives itching  . Other Other (See Comments)    Anesthesia-trouble waking up  . Penicillins Other (See Comments)    Anaphylactic shock Has patient had a PCN reaction causing immediate rash, facial/tongue/throat swelling, SOB or lightheadedness with hypotension:YES Has patient had a PCN reaction causing severe rash involving mucus membranes or skin necrosis: NO Has patient  had a PCN reaction that required hospitalization NO Has patient had a PCN reaction occurring within the last 10 years:NO If all of the above answers are "NO", then may proceed with Cephalosporin use.  . Shellfish Allergy Hives    hives     (Not in a hospital admission)        Results for orders placed or performed during the hospital encounter of 08/03/16 (from the past 48 hour(s))  Comprehensive metabolic panel     Status: Abnormal   Collection Time: 08/03/16  3:37 PM  Result Value Ref Range   Sodium 133 (L) 135 - 145 mmol/L   Potassium 3.8 3.5 - 5.1 mmol/L   Chloride 99 (L) 101 - 111 mmol/L   CO2 25 22 - 32 mmol/L   Glucose, Bld 425 (H) 65 - 99 mg/dL   BUN 21 (H) 6 - 20 mg/dL   Creatinine, Ser 0.75 0.44 - 1.00 mg/dL   Calcium 9.5 8.9 - 10.3 mg/dL   Total Protein 6.7 6.5 - 8.1 g/dL   Albumin 2.8 (L) 3.5 - 5.0 g/dL   AST 16 15 - 41 U/L   ALT 16 14 - 54 U/L   Alkaline Phosphatase 122 38 - 126 U/L   Total Bilirubin 0.4 0.3 - 1.2 mg/dL   GFR calc non Af Amer >60 >60 mL/min   GFR calc Af Amer >60 >60 mL/min    Comment: (NOTE) The eGFR has been calculated using the CKD EPI   equation. This calculation has not been validated in all clinical situations. eGFR's persistently <60 mL/min signify possible Chronic Kidney Disease.   Anion gap 9 5 - 15  CBC     Status: Abnormal   Collection Time: 08/03/16  3:37 PM  Result Value Ref Range   WBC 12.0 (H) 4.0 - 10.5 K/uL   RBC 4.30 3.87 - 5.11 MIL/uL   Hemoglobin 13.1 12.0 - 15.0 g/dL   HCT 37.9 36.0 - 46.0 %   MCV 88.1 78.0 - 100.0 fL   MCH 30.5 26.0 - 34.0 pg   MCHC 34.6 30.0 - 36.0 g/dL   RDW 12.9 11.5 - 15.5 %   Platelets 198 150 - 400 K/uL  I-Stat CG4 Lactic Acid, ED     Status: None   Collection Time: 08/03/16  4:10 PM  Result Value Ref Range   Lactic Acid, Venous 1.75 0.5 - 1.9 mmol/L  I-Stat CG4 Lactic Acid, ED     Status: Abnormal   Collection Time: 08/03/16  7:01 PM  Result Value Ref  Range   Lactic Acid, Venous 2.04 (HH) 0.5 - 1.9 mmol/L   Comment NOTIFIED PHYSICIAN    No results found.  ROS  Blood pressure 128/57, pulse 67, temperature 98.4 F (36.9 C), resp. rate 18, height 5' 1" (1.549 m), weight 48.5 kg (107 lb), SpO2 99 %. Physical Exam Elderly female in no significant distress , right hip incision with surrounding erythema and induration.  I am able to express some purulent drainage with direct pressure.  No fluctuance noted.  No pain with AROM and PROM of the hip.  Leg length is equal  Assessment/Plan Right hip cellulitis s/p hemiarthroplasty, question deeper infection.  Discussed with Dr Swinteck who recommends Internal Medicine admission due to complex medical situation.  No antibiotics per Dr Swinteck until interventional radiologist flouro guided aspiration to rule out septic hip joint tomorrow. Thank you!   Dr Swinteck to see patient tomorrow.   Hazelle Woollard,STEVEN R, MD 08/03/2016, 7:40 PM      

## 2016-08-05 NOTE — Progress Notes (Signed)
Inpatient Diabetes Program Recommendations  AACE/ADA: New Consensus Statement on Inpatient Glycemic Control (2015)  Target Ranges:  Prepandial:   less than 140 mg/dL      Peak postprandial:   less than 180 mg/dL (1-2 hours)      Critically ill patients:  140 - 180 mg/dL   Results for Sharon BeringGOSTO, [name] (MRN [id number]) as of 08/05/2016 14:23  Ref. Range 08/05/2016 09:02 08/05/2016 11:31  Glucose-Capillary Latest Ref Range: 65 - 99 mg/dL 98 132215 (H)   Review of Glycemic Control  Diabetes history: DM 2 Outpatient Diabetes medications: 70/30 18 units BID Current orders for Inpatient glycemic control: Novolog Sensitive Q4hours  Inpatient Diabetes Program Recommendations:   Patient only on Novolog Correction today. Fasting glucose 98 this am. Coordinator left recs yesterday for only NPH (long acting) not 70/30 to control midday hyperglycemia. Patient in OR currently. When patient returns to the floor, Please resume 70/30 12 units BID when eating.  Thanks,  Christena DeemShannon Kollen Armenti RN, MSN, [facility] Inpatient Diabetes Coordinator Team Pager [phone] (8a-5p)

## 2016-08-05 NOTE — Care Management Important Message (Signed)
Important Message  Patient Details  Name: Sharon [name] MRN: [id number] Date of Birth: 10-Apr-1929   Medicare Important Message Given:  Yes    Lawerance Sabalebbie Lataisha Colan, RN 08/05/2016, 12:01 PM

## 2016-08-05 NOTE — Anesthesia Preprocedure Evaluation (Signed)
Anesthesia Evaluation  Patient identified by MRN, date of birth, ID band Patient awake    Reviewed: Allergy & Precautions, H&P , Patient's Chart, lab work & pertinent test results, reviewed documented beta blocker date and time   Airway Mallampati: II  TM Distance: >3 FB Neck ROM: full    Dental no notable dental hx. (+) Poor Dentition, Loose   Pulmonary    Pulmonary exam normal breath sounds clear to auscultation       Cardiovascular  Rhythm:regular Rate:Normal     Neuro/Psych    GI/Hepatic   Endo/Other  diabetes  Renal/GU      Musculoskeletal   Abdominal   Peds  Hematology   Anesthesia Other Findings   Reproductive/Obstetrics                            Anesthesia Physical Anesthesia Plan  ASA: III  Anesthesia Plan: General   Post-op Pain Management:    Induction: Intravenous  Airway Management Planned: Oral ETT  Additional Equipment:   Intra-op Plan:   Post-operative Plan: Extubation in OR  Informed Consent: I have reviewed the patients History and Physical, chart, labs and discussed the procedure including the risks, benefits and alternatives for the proposed anesthesia with the patient or authorized representative who has indicated his/her understanding and acceptance.   Dental Advisory Given and Dental advisory given  Plan Discussed with: CRNA and Surgeon  Anesthesia Plan Comments: (  Discussed general anesthesia, including possible nausea, instrumentation of airway, sore throat,pulmonary aspiration, etc. I asked if the were any outstanding questions, or  concerns before we proceeded.)        Anesthesia Quick Evaluation

## 2016-08-05 NOTE — Transfer of Care (Signed)
Immediate Anesthesia Transfer of Care Note  Patient: Adaleen Raulston  Procedure(s) Performed: Procedure(s): IRRIGATION AND DEBRIDEMENT HIP (Right)  Patient Location: PACU  Anesthesia Type:General  Level of Consciousness: awake and responds to stimulation  Airway & Oxygen Therapy: Patient Spontanous Breathing  Post-op Assessment: Report given to RN and Post -op Vital signs reviewed and stable  Post vital signs: Reviewed and stable  Last Vitals:  Vitals:   08/05/16 0545 08/05/16 1451  BP: 119/61 (!) 130/57  Pulse: 68 65  Resp: 16 (!) 8  Temp: 36.4 C 36.7 C    Last Pain:  Vitals:   08/05/16 1451  TempSrc:   PainSc: Asleep      Patients Stated Pain Goal: 1 (08/04/16 1740)  Complications: No apparent anesthesia complications

## 2016-08-05 NOTE — Progress Notes (Signed)
  Date: 08/05/2016  Patient name: Sharon [name]  Medical record number: [id number]  Date of birth: 1928-10-23   I have seen and evaluated this patient and I have discussed the plan of care with the house staff. Please see Dr. Henriette CombsJohnson's note for complete details. I concur with his findings.  Inez CatalinaEmily B Mullen, MD 08/05/2016, 4:40 PM

## 2016-08-05 NOTE — OR Nursing (Signed)
Large left labial abscess noted upon foley catheter insertion. Purulent drainage noted. Dr. Linna CapriceSwinteck notified. Dr. Corliss Skainssuei was called for a stat intra-op consult.

## 2016-08-05 NOTE — Op Note (Signed)
Pre-op Diagnosis:  Left labial abscess Post-op Diagnosis:  Same Procedure:  Incision and drainage of left labial abscess Surgeon:  Manus RuddSUEI,Atif Chapple K. Anesthesia:  Gen Indications: This was an emergent intraoperative consult by orthopedic surgery for a left labial abscess. The patient is in the operating room for a separate procedure. However, when they were placing her Foley catheter, a large left labial abscess was noted. After I completed my surgery in a separate room I came examined the patient. Dr. Veda CanningSwintek was completing his portion of the case.  She has a 3 x 2 cm abscess in her left labia. There are 2 openings that are draining purulent fluid.  Description of procedure: Her perineum was shaved, prepped with Betadine and draped. Using cautery, I opened up one of the areas of drainage. A hemostat was used to enter the abscess cavity. This tunnels all the way down to the other area that was draining. We open the tunnel slightly to about 1 cm diameter. We irrigated this thoroughly. A half-inch Penrose drain was then tunneled through the abscess and came out of both wounds. This was secured with a single 3-0 nylon suture. A dry dressing was applied. The patient was then extubated and brought to the recovery room in stable condition.  No family was available for discussion either before or after the procedure.  Wilmon ArmsMatthew K. Corliss Skainssuei, MD, [facility]  General/ Trauma Surgery  08/05/2016 2:44 PM

## 2016-08-05 NOTE — Anesthesia Procedure Notes (Signed)
Procedure Name: Intubation Date/Time: 08/05/2016 12:52 PM Performed by: Rush Farmer E Pre-anesthesia Checklist: Patient identified, Emergency Drugs available, Suction available and Patient being monitored Patient Re-evaluated:Patient Re-evaluated prior to inductionOxygen Delivery Method: Circle system utilized Preoxygenation: Pre-oxygenation with 100% oxygen Intubation Type: IV induction Ventilation: Mask ventilation without difficulty Laryngoscope Size: Mac and 3 Grade View: Grade I Tube type: Oral Tube size: 7.0 mm Number of attempts: 1 Airway Equipment and Method: Stylet Placement Confirmation: ETT inserted through vocal cords under direct vision,  positive ETCO2 and breath sounds checked- equal and bilateral Secured at: 21 cm Tube secured with: Tape Dental Injury: Teeth and Oropharynx as per pre-operative assessment

## 2016-08-05 NOTE — Progress Notes (Signed)
Subjective: Planned for OR this morning. More awake and conversational, complains of mild hip pain. Minimal erythema around right hip wound improved from yesterday per family. Daughter reports she continues to have difficulty getting her mother to accept CBGs and take insulin.  Objective: Vital signs in last 24 hours: Vitals:   08/04/16 2130 08/05/16 0545 08/05/16 1451 08/05/16 1505  BP: (!) 147/55 119/61 (!) 130/57 137/61  Pulse: 80 68 65 63  Resp: 18 16 (!) 8 (!) 5  Temp: 99.8 F (37.7 C) 97.5 F (36.4 C) 98 F (36.7 C)   TempSrc: Oral Oral    SpO2: 97% 98% 94% 91%  Weight:      Height:        Intake/Output Summary (Last 24 hours) at 08/05/16 1521 Last data filed at 08/05/16 1445  Gross per 24 hour  Intake          2214.17 ml  Output              950 ml  Net          1264.17 ml    Physical Exam General appearance: Thin and malnurished-appearing elderly woman comfortably asleep, rouses to voice, oriented to self, pleasantly demented and answers simple questions HENT: Normocephalic, temporal wasting, dry mucous membranes Cardiovascular: Regular rate and rhythm, no murmurs, rubs, gallops Respiratory/Chest: Clear to ausculation bilaterally, normal work of breathing Abdomen: Bowel sounds present, soft, non-tender, non-distended Ext: Thin bulk and normal passive range of motion, movement of the right hip and palpation over her bandaged incision elicits no pain Skin: Warm, dry, right lateral incision dry/intact with 2-3 cm minimal surrounding erythema, minimal serosanguinous fluid staining her bandage but no visible active discharge, non-tender to palpation,  Bandaged pressure ulcers over right heel and over sacrum, decreased skin turgor Psych: Appropriate affect  Labs / Imaging / Procedures: CBC Latest Ref Rng & Units 08/05/2016 08/04/2016 08/03/2016  WBC 4.0 - 10.5 K/uL 9.8 9.9 12.0(H)  Hemoglobin 12.0 - 15.0 g/dL 40.912.4 81.112.6 91.413.1  Hematocrit 36.0 - 46.0 % 37.4 37.5 37.9    Platelets 150 - 400 K/uL 202 197 198   BMP Latest Ref Rng & Units 08/05/2016 08/03/2016 07/09/2016  Glucose 65 - 99 mg/dL 782(N184(H) 562(Z425(H) 308(M349(H)  BUN 6 - 20 mg/dL 11 57(Q21(H) 18  Creatinine 0.44 - 1.00 mg/dL 4.690.73 6.290.75 5.280.93  Sodium 135 - 145 mmol/L 141 133(L) 131(L)  Potassium 3.5 - 5.1 mmol/L 3.1(L) 3.8 4.1  Chloride 101 - 111 mmol/L 106 99(L) 97(L)  CO2 22 - 32 mmol/L 27 25 27   Calcium 8.9 - 10.3 mg/dL 9.2 9.5 8.9   No results found.  Assessment/Plan: Presumed surgical wound infection vs poor wound healing / dehiscence: Patient with right hip replacement on 07/08/2016 presenting with serosanguinous drainage from surgical site and surrounding induration/erythema. Patient is afebrile and with resolved leukocytosis. Ortho evaluated patient underwent image-guided joint aspiration on 1/11 which revealed bloody fluid with a few neutrophils, gram stain showed no organisms and visible WBCs.  LA 1.7>2.04>1.2>1.1>1.1. Ortho relook on 1/12. -- Ortho surgical eval today, I&D vs debridement vs hardware replacement depending on findings - follow deep wound cultures -- Bland aspirate results, culture pending, will continue empiric Vancomycin per pharmacy --Tramadol 50mg  PRN for pain -- Follow BCx  -- Trend CBC  T2DM: Patient on home regimen of Novolog 70/30 18U BID as this is easier regimen for her to allow decreased sticks; her last A1C in Nov 2017 was 10.5. On admission glucose was elevated to  400's which could be exacerbated by wound infection. -- Per diabetes coordinator, while NPO today:  - Q4H CBGs and SSI-S, held NPH due to hypoglycemia this AM - Restarting 70/30 12 units BID this evening post-op and CBGs Q6H, advance diet as tolerate up to carb modified  Chronic multiple decubitus ulcers: Patient with well healing sacral and heel ulcers obtained from stay in [place] hospital last year. Heel ulcer appears to be healing well; slight worsening in condition of sacral ulcer from initial  presentation in Nov.  --wound care consult - appreciate assistance  Difficulty swallowing, per patients RN, no choking just not swallowing pills, suspect related to dementia - SLP eval - normal  Dispo: Anticipated discharge in approximately 2-3 day(s).   LOS: 2 days   Althia Forts, MD 08/05/2016, 3:21 PM Pager: [phone]

## 2016-08-05 NOTE — Interval H&P Note (Signed)
History and Physical Interval Note:  08/05/2016 12:26 PM  Sharon Butler  has presented today for surgery, with the diagnosis of Right Hip Wound Dehiscence  The various methods of treatment have been discussed with the patient and family. After consideration of risks, benefits and other options for treatment, the patient has consented to  Procedure(s): IRRIGATION AND DEBRIDEMENT HIP WITH POSSIBLE HEAD BALL EXCHANGE (Right) as a surgical intervention .  The patient's history has been reviewed, patient examined, no change in status, stable for surgery.  I have reviewed the patient's chart and labs.  Questions were answered to the patient's satisfaction.     Norton Bivins, Cloyde ReamsBrian James

## 2016-08-06 ENCOUNTER — Encounter (HOSPITAL_COMMUNITY): Payer: Self-pay | Admitting: Orthopedic Surgery

## 2016-08-06 LAB — BASIC METABOLIC PANEL
Anion gap: 7 (ref 5–15)
BUN: 9 mg/dL (ref 6–20)
CALCIUM: 8.9 mg/dL (ref 8.9–10.3)
CHLORIDE: 101 mmol/L (ref 101–111)
CO2: 26 mmol/L (ref 22–32)
CREATININE: 0.75 mg/dL (ref 0.44–1.00)
GFR calc Af Amer: >60 mL/min (ref >60)
GFR calc non Af Amer: >60 mL/min (ref >60)
GLUCOSE: 348 mg/dL — AB (ref 65–99)
Potassium: 3.8 mmol/L (ref 3.5–5.1)
Sodium: 134 mmol/L — ABNORMAL LOW (ref 135–145)

## 2016-08-06 LAB — CBC
HCT: 33.8 % — ABNORMAL LOW (ref 36.0–46.0)
Hemoglobin: 11.4 g/dL — ABNORMAL LOW (ref 12.0–15.0)
MCH: 29.7 pg (ref 26.0–34.0)
MCHC: 33.7 g/dL (ref 30.0–36.0)
MCV: 88 fL (ref 78.0–100.0)
PLATELETS: 194 10*3/uL (ref 150–400)
RBC: 3.84 MIL/uL — ABNORMAL LOW (ref 3.87–5.11)
RDW: 12.7 % (ref 11.5–15.5)
WBC: 9 10*3/uL (ref 4.0–10.5)

## 2016-08-06 LAB — GLUCOSE, CAPILLARY
GLUCOSE-CAPILLARY: 367 mg/dL — AB (ref 65–99)
GLUCOSE-CAPILLARY: 170 mg/dL — AB (ref 65–99)
Glucose-Capillary: 384 mg/dL — ABNORMAL HIGH (ref 65–99)
Glucose-Capillary: 355 mg/dL — ABNORMAL HIGH (ref 65–99)

## 2016-08-06 MED ORDER — INSULIN ASPART PROT & ASPART (70-30 MIX) 100 UNIT/ML ~~LOC~~ SUSP
18.0000 [IU] | Freq: Two times a day (BID) | SUBCUTANEOUS | Status: DC
  Administered 2016-08-06 – 2016-08-09 (×6): 18 [IU] via SUBCUTANEOUS

## 2016-08-06 MED ORDER — INSULIN ASPART 100 UNIT/ML ~~LOC~~ SOLN
0.0000 [IU] | SUBCUTANEOUS | Status: DC
  Administered 2016-08-06: 2 [IU] via SUBCUTANEOUS
  Administered 2016-08-06 (×2): 9 [IU] via SUBCUTANEOUS
  Administered 2016-08-07 (×2): 2 [IU] via SUBCUTANEOUS
  Administered 2016-08-07: 7 [IU] via SUBCUTANEOUS
  Administered 2016-08-07 (×2): 2 [IU] via SUBCUTANEOUS
  Administered 2016-08-07: 3 [IU] via SUBCUTANEOUS
  Administered 2016-08-08: 2 [IU] via SUBCUTANEOUS
  Administered 2016-08-08: 9 [IU] via SUBCUTANEOUS
  Administered 2016-08-08: 5 [IU] via SUBCUTANEOUS
  Administered 2016-08-08: 3 [IU] via SUBCUTANEOUS
  Administered 2016-08-08: 1 [IU] via SUBCUTANEOUS
  Administered 2016-08-08: 2 [IU] via SUBCUTANEOUS
  Administered 2016-08-09: 7 [IU] via SUBCUTANEOUS
  Administered 2016-08-09: 3 [IU] via SUBCUTANEOUS
  Administered 2016-08-09: 7 [IU] via SUBCUTANEOUS
  Administered 2016-08-09: 2 [IU] via SUBCUTANEOUS
  Administered 2016-08-10: 5 [IU] via SUBCUTANEOUS
  Administered 2016-08-10: 9 [IU] via SUBCUTANEOUS
  Administered 2016-08-10: 5 [IU] via SUBCUTANEOUS
  Administered 2016-08-10: 9 [IU] via SUBCUTANEOUS
  Administered 2016-08-10 – 2016-08-11 (×2): 3 [IU] via SUBCUTANEOUS
  Administered 2016-08-11 (×2): 5 [IU] via SUBCUTANEOUS
  Administered 2016-08-11: 3 [IU] via SUBCUTANEOUS
  Administered 2016-08-11: 5 [IU] via SUBCUTANEOUS
  Administered 2016-08-11: 7 [IU] via SUBCUTANEOUS
  Administered 2016-08-12 (×2): 2 [IU] via SUBCUTANEOUS
  Administered 2016-08-12: 3 [IU] via SUBCUTANEOUS

## 2016-08-06 MED ORDER — LORAZEPAM 2 MG/ML IJ SOLN
0.2500 mg | Freq: Once | INTRAMUSCULAR | Status: AC
  Administered 2016-08-06: 0.25 mg via INTRAVENOUS
  Filled 2016-08-06: qty 1

## 2016-08-06 NOTE — Progress Notes (Signed)
Culture update   The superficial wound culture has (+) GPC. Culture pending.  Thus far, the synovial fluid cultures are gram stain (-) and NGTD.  Recent Results (from the past 240 hour(s))  Blood culture (routine x 2)     Status: None (Preliminary result)   Collection Time: 08/03/16  6:27 PM  Result Value Ref Range Status   Specimen Description BLOOD RIGHT ANTECUBITAL  Final   Special Requests BOTTLES DRAWN AEROBIC AND ANAEROBIC 5CC  Final   Culture NO GROWTH 3 DAYS  Final   Report Status PENDING  Incomplete  Blood culture (routine x 2)     Status: None (Preliminary result)   Collection Time: 08/04/16 12:10 AM  Result Value Ref Range Status   Specimen Description BLOOD LEFT ANTECUBITAL  Final   Special Requests IN PEDIATRIC BOTTLE 1CC  Final   Culture NO GROWTH 2 DAYS  Final   Report Status PENDING  Incomplete  Body fluid culture     Status: None (Preliminary result)   Collection Time: 08/04/16 10:41 AM  Result Value Ref Range Status   Specimen Description FLUID RIGHT HIP  Final   Special Requests NONE  Final   Gram Stain   Final    RARE WBC PRESENT, PREDOMINANTLY PMN NO ORGANISMS SEEN    Culture NO GROWTH 2 DAYS  Final   Report Status PENDING  Incomplete  Surgical pcr screen     Status: Abnormal   Collection Time: 08/05/16  1:22 AM  Result Value Ref Range Status   MRSA, PCR POSITIVE (A) NEGATIVE Final    Comment: RESULT CALLED TO, READ BACK BY AND VERIFIED WITH: L LEWIS,RN @0426  08/05/16 MKELLY,MLT    Staphylococcus aureus POSITIVE (A) NEGATIVE Final    Comment:        The Xpert SA Assay (FDA approved for NASAL specimens in patients over 81 years of age), is one component of a comprehensive surveillance program.  Test performance has been validated by [facility] for patients greater than or equal to 81 year old. It is not intended to diagnose infection nor to guide or monitor treatment.   Aerobic/Anaerobic Culture (surgical/deep wound)     Status: None  (Preliminary result)   Collection Time: 08/05/16  3:23 PM  Result Value Ref Range Status   Specimen Description SYNOVIAL RIGHT HIP  Final   Special Requests PATIENT ON FOLLOWING VANC  Final   Gram Stain   Final    FEW WBC PRESENT, PREDOMINANTLY MONONUCLEAR FEW WBC PRESENT, PREDOMINANTLY PMN NO ORGANISMS SEEN    Culture NO GROWTH < 24 HOURS  Final   Report Status PENDING  Incomplete  Aerobic/Anaerobic Culture (surgical/deep wound)     Status: None (Preliminary result)   Collection Time: 08/05/16  3:23 PM  Result Value Ref Range Status   Specimen Description WOUND RIGHT HIP  Final   Special Requests   Final    PER MD SUPERFICIAL FLUID ON SWAB PATIENT ON FOLLOWING VANC   Gram Stain   Final    MODERATE WBC PRESENT, PREDOMINANTLY PMN MODERATE GRAM POSITIVE COCCI IN CLUSTERS    Culture CULTURE REINCUBATED FOR BETTER GROWTH  Final   Report Status PENDING  Incomplete

## 2016-08-06 NOTE — Progress Notes (Signed)
Subjective: 1 Day Post-Op Procedure(s) (LRB): IRRIGATION AND DEBRIDEMENT HIP (Right) IRRIGATION AND DEBRIDEMENT LABIAL ABSCESS (Right) Patient reports pain as mild.  Family at bedside. Reports a fair night. Denies SOB, CP, or calf pain.  Objective: Vital signs in last 24 hours: Temp:  [97.7 F (36.5 C)-98.6 F (37 C)] 97.7 F (36.5 C) (01/13 0437) Pulse Rate:  [63-71] 68 (01/13 0437) Resp:  [5-20] 18 (01/13 0437) BP: (127-137)/(46-75) 127/46 (01/13 0437) SpO2:  [91 %-97 %] 94 % (01/13 0437)  Intake/Output from previous day: 01/12 0701 - 01/13 0700 In: 1034.2 [I.V.:1034.2] Out: 2075 [Urine:1875; Blood:200] Intake/Output this shift: No intake/output data recorded.   Recent Labs  08/03/16 1537 08/04/16 0230 08/05/16 0704 08/06/16 0659  HGB 13.1 12.6 12.4 11.4*    Recent Labs  08/05/16 0704 08/06/16 0659  WBC 9.8 9.0  RBC 4.24 3.84*  HCT 37.4 33.8*  PLT 202 194    Recent Labs  08/05/16 0704 08/06/16 0659  NA 141 134*  K 3.1* 3.8  CL 106 101  CO2 27 26  BUN 11 9  CREATININE 0.73 0.75  GLUCOSE 184* 348*  CALCIUM 9.2 8.9   No results for input(s): LABPT, INR in the last 72 hours.  Well nourished. Alert and oriented x3. RRR, Lungs clear, BS x4. Abdomen soft and non tender. Right Calf soft and non tender. Right hip dressing C/D/I. Wound vac inplace. No DVT signs. Compartment soft. No signs of infection.  Right LE grossly neurovascular intact.  Assessment/Plan: 1 Day Post-Op Procedure(s) (LRB): IRRIGATION AND DEBRIDEMENT HIP (Right) IRRIGATION AND DEBRIDEMENT LABIAL ABSCESS (Right) Cultures pending (synovial and wound) Continue current care Family  Updated PT and OT SCds Will plan to monitor the weekend and may require SNF at D/c. Devontaye Ground L 08/06/2016, 10:16 AM

## 2016-08-06 NOTE — Progress Notes (Signed)
PT Cancellation Note  Patient Details Name: Fabio BeringRamona Gumina MRN: [id number] DOB: [birth year]   Cancelled Treatment:    Reason Eval/Treat Not Completed: Fatigue/lethargy limiting ability to participate;Medical issues which prohibited therapy. Pt has been asleep since 5AM. Pt is not easy to arouse and would not be safe to attempt any OOB mobility. Will reattempt evaluation when pt is more able to participate.    Colin BroachSabra M. Leniyah Martell PT, DPT  [phone]  08/06/2016, 3:33 PM

## 2016-08-06 NOTE — Progress Notes (Signed)
Subjective:  Pt is POD#1 s/p I&D of right hip as well as labial abscess   This morning, she is arousable to sternal rub. Sitter at bedside, says she was awake entire night and finally fell asleep 1 hour ago. Patient's husband is also at bedside. She has dementia at baseline.    Objective: Vital signs in last 24 hours: Vitals:   08/05/16 1730 08/05/16 1830 08/05/16 2100 08/06/16 0437  BP: 131/62 127/61 135/75 (!) 127/46  Pulse: 64 71 69 68  Resp: 16 18 20 18   Temp: 98.5 F (36.9 C) 98.5 F (36.9 C) 98.6 F (37 C) 97.7 F (36.5 C)  TempSrc:  Oral Oral Axillary  SpO2: 93% 97% 95% 94%  Weight:      Height:        Intake/Output Summary (Last 24 hours) at 08/06/16 1011 Last data filed at 08/06/16 0700  Gross per 24 hour  Intake              700 ml  Output             2075 ml  Net            -1375 ml    Physical Exam General appearance: Thin and malnurished-appearing elderly woman comfortably asleep, rouses to voice,  Cardiovascular: Regular rate and rhythm, no murmurs, rubs, gallops Respiratory/Chest: anterior lung fields clear to auscultation Abdomen: Bowel sounds present, soft, non-tender, non-distended Ext: drain placed over the right hip, minimal drainage , no erythema or tenderness  GU: penrose drain present on the labia   Labs / Imaging / Procedures: CBC Latest Ref Rng & Units 08/06/2016 08/05/2016 08/04/2016  WBC 4.0 - 10.5 K/uL 9.0 9.8 9.9  Hemoglobin 12.0 - 15.0 g/dL 11.4(L) 12.4 12.6  Hematocrit 36.0 - 46.0 % 33.8(L) 37.4 37.5  Platelets 150 - 400 K/uL 194 202 197   BMP Latest Ref Rng & Units 08/06/2016 08/05/2016 08/03/2016  Glucose 65 - 99 mg/dL 272(Z348(H) 366(Y184(H) 403(K425(H)  BUN 6 - 20 mg/dL 9 11 74(Q21(H)  Creatinine 0.44 - 1.00 mg/dL 5.950.75 6.380.73 7.560.75  Sodium 135 - 145 mmol/L 134(L) 141 133(L)  Potassium 3.5 - 5.1 mmol/L 3.8 3.1(L) 3.8  Chloride 101 - 111 mmol/L 101 106 99(L)  CO2 22 - 32 mmol/L 26 27 25   Calcium 8.9 - 10.3 mg/dL 8.9 9.2 9.5   No results  found.  Assessment/Plan:  Right hip wound dehiscence s/p I&D: Pt is POD1. She is afebrile and no leukocytosis. The intraop cultures were mislabeled and orthopedics have already spoken to micro lab. Synovial right hip culture shows moderate WBC- gram positive cocci in clusters. 2850 WBC cell count with 82% neutrophils. Blood cultures from 1/10 NGTD   -orthopedics following and appreciate recs -continue vanc  -Tramadol 50 mg PRN for pain  Labial abscess: While doing the right hip I&D, a labial abscess was noted which was drained by general surgery, and a penrose drain si currently present  -gen surg following -continue vanc    T2DM: Patient on home regimen of Novolog 70/30 18U BID as this is easier regimen for her to allow decreased sticks; her last A1C in Nov 2017 was 10.5. On admission glucose was elevated to 400's which could be exacerbated by wound infection. Currently CBGs in 300s  -increased novolog 70//30 to 18 units BID -re instituted SSI-S to improve her glycemic control for better wound healing    Dispo: Anticipated discharge in approximately 2-3 day(s).   LOS: 3 days  Deneise Lever, MD 08/06/2016, 10:11 AM

## 2016-08-06 NOTE — Progress Notes (Signed)
[facility]:  [phone] General Surgery Progress Note   LOS: 3 days  POD -  1 Day Post-Op  Assessment/Plan: 1.  IRRIGATION AND DEBRIDEMENT LABIAL ABSCESS - 08/05/2016 - [place]  Drain in place  Will check patient again on Monday to see if drain ready to be removed.  The wound otherwise looks okay I spoke to husband and daughter (by phone) about this infection and its management.  2. IRRIGATION AND DEBRIDEMENT Right HIP - 08/05/2016 - [place]  History of right hip replacement on 07/07/2016  On Vanc  3.  DM 4.  Chronic decubitus ulcers 5.  Dementia 6.  MRSA 7.  DVT prophylaxis - Lovenox   Active Problems:   Poorly controlled type 2 diabetes mellitus (HCC)   Infected surgical wound   Infection of prosthetic hip joint (HCC)   Pressure injury of skin   Malnutrition of moderate degree   Surgical site infection  Subjective:  Confused, non communicative.  Her husband is at the bedside.  The family plans to take her home - which seems like a lot of management.  Objective:   Vitals:   08/05/16 2100 08/06/16 0437  BP: 135/75 (!) 127/46  Pulse: 69 68  Resp: 20 18  Temp: 98.6 F (37 C) 97.7 F (36.5 C)     Intake/Output from previous day:  01/12 0701 - 01/13 0700 In: 1034.2 [I.V.:1034.2] Out: 2075 [Urine:1875; Blood:200]  Intake/Output this shift:  No intake/output data recorded.   Physical Exam:   General: Older female who does not communicate.   HEENT: Normal. Pupils equal. .   Lungs: Clear.   Abdomen: Soft   Wound: left labial abscess drained.  Right hip with VAC.   Lab Results:    Recent Labs  08/05/16 0704 08/06/16 0659  WBC 9.8 9.0  HGB 12.4 11.4*  HCT 37.4 33.8*  PLT 202 194    BMET   Recent Labs  08/05/16 0704 08/06/16 0659  NA 141 134*  K 3.1* 3.8  CL 106 101  CO2 27 26  GLUCOSE 184* 348*  BUN 11 9  CREATININE 0.73 0.75  CALCIUM 9.2 8.9    PT/INR  No results for input(s): LABPROT, INR in the last 72  hours.  ABG  No results for input(s): PHART, HCO3 in the last 72 hours.  Invalid input(s): PCO2, PO2   Studies/Results:  Dg Fluoro Guided Needle Plc Aspiration/injection Loc  Result Date: 08/04/2016 CLINICAL DATA:  Cellulitis over recent right hip arthroplasty. EXAM: RIGHT HIP ASPIRATION UNDER FLUOROSCOPY FLUOROSCOPY TIME:  Fluoroscopy Time:  24 SECONDS Radiation Exposure Index (if provided by the fluoroscopic device): 0.5 MGY Number of Acquired Spot Images: 0 PROCEDURE: The patient's right hip hemiarthroplasty incision showed indurated cellulitic appearing skin peripherally which was avoided. Overlying skin prepped with Betadine, draped in the usual sterile fashion, and infiltrated locally with buffered Lidocaine. 20 gauge spinal needle advanced to prosthetic femoral head neck junction with dry tap. The pseudo capsule was targeted more inferiorly and the needle tip was advanced to the metal trochanter where there was spontaneous return of 4 cc serosanguineous fluid. IMPRESSION: Technically successful right hip aspiration under fluoroscopy. 4 cc of serosanguineous fluid was sent to the lab. Electronically Signed   By: Marnee Spring M.D.   On: 08/04/2016 11:01     Anti-infectives:   Anti-infectives    Start     Dose/Rate Route Frequency Ordered Stop   08/05/16 1215  vancomycin (VANCOCIN) IVPB 1000 mg/200 mL premix  Status:  Discontinued     1,000 mg 200 mL/hr over 60 Minutes Intravenous On call to O.R. 08/05/16 0723 08/05/16 0725   08/04/16 1400  vancomycin (VANCOCIN) IVPB 750 mg/150 ml premix     750 mg 150 mL/hr over 60 Minutes Intravenous Every 24 hours 08/04/16 1254     08/04/16 1315  levofloxacin (LEVAQUIN) IVPB 500 mg     500 mg 100 mL/hr over 60 Minutes Intravenous  Once 08/04/16 1301 08/04/16 1512      Ovidio Kinavid Ellon Marasco, MD, FACS Pager: [phone] [facility]: [phone] 08/06/2016

## 2016-08-06 NOTE — Plan of Care (Signed)
Problem: Pain Managment: Goal: General experience of comfort will improve Outcome: Progressing Denies pain  Problem: Skin Integrity: Goal: Risk for impaired skin integrity will decrease Outcome: Progressing Wound vac not draining any drainage  Problem: Tissue Perfusion: Goal: Risk factors for ineffective tissue perfusion will decrease Outcome: Progressing No S/S of DVT, patient's spouse refused SCDs   Problem: Activity: Goal: Risk for activity intolerance will decrease Outcome: Progressing Good bed mobility  Problem: Bowel/Gastric: Goal: Will not experience complications related to bowel motility Outcome: Progressing No gastric or bowel issues reported

## 2016-08-06 NOTE — Op Note (Signed)
Sharon Butler, Sharon Butler NO.:  [id number]  MEDICAL RECORD NO.:  [id number]  LOCATION:  [id number]                        FACILITY:  [facility]  PHYSICIAN:  Samson Frederic, MD     DATE OF BIRTH:  1929-01-02  DATE OF PROCEDURE:  08/05/2016 DATE OF DISCHARGE:                              OPERATIVE REPORT   SURGEON:  Samson Frederic, MD  ASSISTANT:  Hart Carwin, RNFA.  PREOPERATIVE DIAGNOSIS:  Surgical site infection, right hip.  POSTOPERATIVE DIAGNOSIS:  Surgical site infection, right hip.  PROCEDURE PERFORMED:  Debridement of skin and subcutaneous tissue of the right hip with irrigation.  ANESTHESIA:  General.  ANTIBIOTICS:  Vancomycin 1 g.  SPECIMENS: 1. Right hip superficial wound culture. 2. Right hip synovial fluid for cell count with differential and     culture.  TUBES AND DRAINS:  Prevena wound VAC x1.  COMPLICATIONS:  None.  DISPOSITION:  Stable to PACU.  INDICATIONS:  The patient is an 83 year old female, who is well known to me for previous right hip hemiarthroplasty for displaced femoral neck fracture.  She is [place] coming from [place] from [place]. She was brought [place] the [place] with uncontrolled diabetes and multiple decubitus ulcers.  The patient had been doing well until about a few days ago.  She developed pain, erythema, drainage around the incision site.  She is able to ambulate.  She does not report any hip pain.  She was admitted to the Hospitalist Service.  Before antibiotics were started, she was sent to Radiology for an image-guided right hip aspiration.  Some fluid was recovered, but cell count was unable to be performed because it was clotted.  Gram stain was negative.  Culture is still pending.  Because the incision was looking erythematous and angry, I did not want to wait for the cultures to come back before providing surgical treatment.  I discussed the risks, benefits, and alternatives with the patient's family and they  elected to proceed.  DESCRIPTION OF PROCEDURE IN DETAIL:  I identified the patient in the holding area.  I marked the surgical site.  She was taken to the operating room.  General anesthesia was induced on her bed.  Foley catheter was inserted.  Upon insert of the Foley, she was noted to have a draining left labial abscess.  I consulted Dr. Corliss Skains, who is going to evaluate the patient for this issue.  The patient was then transferred to the [facility] table.  The right hip was prepped and draped in the normal sterile surgical fashion.  Time-out was called verifying the site and side of surgery.  She did receive 1 g of IV vancomycin.  I began by examining her right hip.  The distal two-thirds of the incision, she had eschar, some devitalized skin edges.  She had surrounding erythema, induration, some expressible fluid.  I opened her previous incision. There was a small purulent fluid collection superficially, which I swabbed and sent for culture.  I then used a #15 blade to sharply debride nonviable skin edges and subcutaneous fat.  I used a rongeur also to excisionally debride subcutaneous fat and all remaining suture material.  I then opened up her  entire incision.  I used a curette to debride all the surfaces for the purpose of biofilm disruption.  I examined the fascial suture line, which was completely intact.  There was no drainage.  No defects in the fascia.  I copiously irrigated the wound with 9 L of saline.  Through a separate site away from the incision, I inserted an 18-gauge spinal needle into the hip and I aspirated about 5 mL of serosanguineous joint fluid.  This was sent to the lab for cell count with differential and culture as well.  I did not open her fascia. The wound was then closed in layers with 2-0 Monocryl for the deep dermal layer and 2-0 nylon mattress sutures.  Prevena wound VAC was applied.  Got excellent suction.  The patient was then turned over to Dr. Corliss Skainssuei,  please see his dictation for his part of the procedure.  We will readmit the patient to the hospitalist.  In terms of her right hip, she may weight bear as tolerated.  We will maintain her Prevena wound VAC.  We will follow up her intraoperative superficial and deep cultures.  We will place her on vancomycin until the culture results are available.  We will plan to discharge her from the hospital with a Prevena home wound VAC and I will need to see her 1 week after discharge to remove the [facility], management of labial abscess per Dr. Corliss Skainssuei.  All findings and happenings were communicated to the patient's family.          ______________________________ Samson FredericBrian Shayanne Gomm, MD     BS/MEDQ  D:  08/05/2016  T:  08/06/2016  Job:  [id number]

## 2016-08-07 DIAGNOSIS — L0889 Other specified local infections of the skin and subcutaneous tissue: Secondary | ICD-10-CM

## 2016-08-07 DIAGNOSIS — T8451XD Infection and inflammatory reaction due to internal right hip prosthesis, subsequent encounter: Secondary | ICD-10-CM

## 2016-08-07 DIAGNOSIS — Z9689 Presence of other specified functional implants: Secondary | ICD-10-CM

## 2016-08-07 DIAGNOSIS — B961 Klebsiella pneumoniae [K. pneumoniae] as the cause of diseases classified elsewhere: Secondary | ICD-10-CM

## 2016-08-07 DIAGNOSIS — Z885 Allergy status to narcotic agent status: Secondary | ICD-10-CM

## 2016-08-07 DIAGNOSIS — Z794 Long term (current) use of insulin: Secondary | ICD-10-CM

## 2016-08-07 DIAGNOSIS — M00851 Arthritis due to other bacteria, right hip: Secondary | ICD-10-CM

## 2016-08-07 DIAGNOSIS — R262 Difficulty in walking, not elsewhere classified: Secondary | ICD-10-CM

## 2016-08-07 DIAGNOSIS — L89623 Pressure ulcer of left heel, stage 3: Secondary | ICD-10-CM

## 2016-08-07 DIAGNOSIS — B9561 Methicillin susceptible Staphylococcus aureus infection as the cause of diseases classified elsewhere: Secondary | ICD-10-CM

## 2016-08-07 DIAGNOSIS — L89613 Pressure ulcer of right heel, stage 3: Secondary | ICD-10-CM

## 2016-08-07 DIAGNOSIS — T8132XD Disruption of internal operation (surgical) wound, not elsewhere classified, subsequent encounter: Secondary | ICD-10-CM

## 2016-08-07 DIAGNOSIS — Z9889 Other specified postprocedural states: Secondary | ICD-10-CM

## 2016-08-07 DIAGNOSIS — F0390 Unspecified dementia without behavioral disturbance: Secondary | ICD-10-CM

## 2016-08-07 DIAGNOSIS — B9689 Other specified bacterial agents as the cause of diseases classified elsewhere: Secondary | ICD-10-CM

## 2016-08-07 DIAGNOSIS — Z880 Allergy status to penicillin: Secondary | ICD-10-CM

## 2016-08-07 DIAGNOSIS — N764 Abscess of vulva: Secondary | ICD-10-CM

## 2016-08-07 DIAGNOSIS — L89103 Pressure ulcer of unspecified part of back, stage 3: Secondary | ICD-10-CM

## 2016-08-07 DIAGNOSIS — Y792 Prosthetic and other implants, materials and accessory orthopedic devices associated with adverse incidents: Secondary | ICD-10-CM

## 2016-08-07 DIAGNOSIS — Z884 Allergy status to anesthetic agent status: Secondary | ICD-10-CM

## 2016-08-07 DIAGNOSIS — Z91013 Allergy to seafood: Secondary | ICD-10-CM

## 2016-08-07 DIAGNOSIS — Z96641 Presence of right artificial hip joint: Secondary | ICD-10-CM

## 2016-08-07 LAB — GLUCOSE, CAPILLARY
GLUCOSE-CAPILLARY: 187 mg/dL — AB (ref 65–99)
GLUCOSE-CAPILLARY: 239 mg/dL — AB (ref 65–99)
Glucose-Capillary: 139 mg/dL — ABNORMAL HIGH (ref 65–99)
Glucose-Capillary: 162 mg/dL — ABNORMAL HIGH (ref 65–99)
Glucose-Capillary: 174 mg/dL — ABNORMAL HIGH (ref 65–99)
Glucose-Capillary: 195 mg/dL — ABNORMAL HIGH (ref 65–99)
Glucose-Capillary: 322 mg/dL — ABNORMAL HIGH (ref 65–99)

## 2016-08-07 LAB — BODY FLUID CULTURE: Culture: NO GROWTH

## 2016-08-07 LAB — BASIC METABOLIC PANEL
Anion gap: 8 (ref 5–15)
BUN: 8 mg/dL (ref 6–20)
CO2: 28 mmol/L (ref 22–32)
CREATININE: 0.72 mg/dL (ref 0.44–1.00)
Calcium: 9 mg/dL (ref 8.9–10.3)
Chloride: 103 mmol/L (ref 101–111)
GFR calc Af Amer: >60 mL/min (ref >60)
Glucose, Bld: 180 mg/dL — ABNORMAL HIGH (ref 65–99)
Potassium: 3.5 mmol/L (ref 3.5–5.1)
SODIUM: 139 mmol/L (ref 135–145)

## 2016-08-07 LAB — CBC
HCT: 35.3 % — ABNORMAL LOW (ref 36.0–46.0)
Hemoglobin: 11.8 g/dL — ABNORMAL LOW (ref 12.0–15.0)
MCH: 29.8 pg (ref 26.0–34.0)
MCHC: 33.4 g/dL (ref 30.0–36.0)
MCV: 89.1 fL (ref 78.0–100.0)
PLATELETS: 210 10*3/uL (ref 150–400)
RBC: 3.96 MIL/uL (ref 3.87–5.11)
RDW: 12.9 % (ref 11.5–15.5)
WBC: 8.2 10*3/uL (ref 4.0–10.5)

## 2016-08-07 MED ORDER — ATORVASTATIN CALCIUM 10 MG PO TABS
5.0000 mg | ORAL_TABLET | Freq: Every day | ORAL | Status: DC
  Filled 2016-08-07 (×2): qty 1

## 2016-08-07 MED ORDER — DOXYCYCLINE HYCLATE 100 MG PO TABS
100.0000 mg | ORAL_TABLET | Freq: Two times a day (BID) | ORAL | Status: DC
  Administered 2016-08-07 – 2016-08-12 (×10): 100 mg via ORAL
  Filled 2016-08-07 (×10): qty 1

## 2016-08-07 MED ORDER — SULFAMETHOXAZOLE-TRIMETHOPRIM 800-160 MG PO TABS
1.0000 | ORAL_TABLET | Freq: Two times a day (BID) | ORAL | Status: DC
  Administered 2016-08-07: 1 via ORAL
  Filled 2016-08-07: qty 1

## 2016-08-07 MED ORDER — CIPROFLOXACIN HCL 500 MG PO TABS
500.0000 mg | ORAL_TABLET | Freq: Two times a day (BID) | ORAL | Status: DC
  Administered 2016-08-07: 500 mg via ORAL
  Filled 2016-08-07: qty 1

## 2016-08-07 MED ORDER — CIPROFLOXACIN HCL 500 MG PO TABS
500.0000 mg | ORAL_TABLET | Freq: Two times a day (BID) | ORAL | Status: DC
  Administered 2016-08-07 – 2016-08-12 (×10): 500 mg via ORAL
  Filled 2016-08-07 (×10): qty 1

## 2016-08-07 MED ORDER — CEPHALEXIN 500 MG PO CAPS: 500.0000 mg | ORAL_CAPSULE | Freq: Four times a day (QID) | ORAL | Status: DC

## 2016-08-07 NOTE — Evaluation (Signed)
Occupational Therapy Evaluation Patient Details Name: Sharon Butler MRN: [id number] DOB: [birth year] Today's Date: 08/07/2016    History of Present Illness 83 yo F who presents post-op IRRIGATION AND DEBRIDEMENT HIP (Right) IRRIGATION AND DEBRIDEMENT LABIAL ABSCESS (Right). Pt with a significant PMH of T2DM (not managed well), currently on insulin, and Alzheimer's, hyperglycemic, history of UTIs, ongoing pressure ulcer issues.   Clinical Impression   Limited bed level evaluation this session. PTA Pt dependent in ADL, limited mobility within house with RW and min guard from family. Pt currently total assist for ADL and +2 for bed mobility (rolling). Pt difficult to arouse this session, using lights on, loud voices, and sternal rub. Please see performance level below. Pt's husband insist that he wants to take the Pt home and care for her when therapy suggested SNF. He stated "I feel like I am sending her there to hurry up and die." Pallative Care Consult strongly recommended for family and Pt support. Pt will benefit from skilled OT in the acute care setting prior to dc to venue below to maximize safety and independence in ADL. Next session to focus on getting Pt to participate in seated grooming tasks.     Follow Up Recommendations  Supervision/Assistance - 24 hour;Other (comment);Home health OT (Husband declining SNF recommendation)    Equipment Recommendations  None recommended by OT    Recommendations for Other Services Other (comment) (Pallative Care Consult)     Precautions / Restrictions Precautions Precautions: Fall Restrictions Weight Bearing Restrictions: Yes RLE Weight Bearing: Weight bearing as tolerated Other Position/Activity Restrictions: positional rotations due to pressure ulcers      Mobility Bed Mobility Overal bed mobility: Needs Assistance Bed Mobility: Rolling (Repositioning in the bed) Rolling: +2 for physical assistance;Total assist;+2 for safety/equipment         General bed mobility comments: Pt total assist, rolling to L side unable to assist with verbal cues and physical cues  Transfers                 General transfer comment: not attempted this session    Balance                                            ADL Overall ADL's : Needs assistance/impaired                                       General ADL Comments: Pt totally dependent in all ADL; Pt wore adult diapers at baseline, today performed bed level grooming.     Vision     Perception     Praxis      Pertinent Vitals/Pain Pain Assessment: Faces Faces Pain Scale: Hurts little more Pain Location: no stated by Pt - R hip Pain Descriptors / Indicators: Grimacing;Operative site guarding Pain Intervention(s): Limited activity within patient's tolerance;Repositioned     Hand Dominance     Extremity/Trunk Assessment Upper Extremity Assessment Upper Extremity Assessment: Generalized weakness   Lower Extremity Assessment Lower Extremity Assessment: RLE deficits/detail RLE Deficits / Details: deficits in strength and ROM    Cervical / Trunk Assessment Cervical / Trunk Assessment: Normal   Communication Communication Communication: Other (comment) (Bilingual, slow processing time. One word responses)   Cognition Arousal/Alertness: Lethargic Behavior During Therapy: Flat affect Overall Cognitive Status: History  of cognitive impairments - at baseline                 General Comments: Pt has end stage dementia/Alzheimers. Pt largely non-responsive this session (potentially due to medications) husband answers all questions.   General Comments   Pt's husband present and provided background/baseline information. He declined information about SNF at all. "I know that if she goes there I am sending her to death" Pallative care might be considered to provide support for family and Pt    Exercises       Shoulder  Instructions      Home Living Family/patient expects to be discharged to:: Private residence Living Arrangements: Spouse/significant other;Children Available Help at Discharge: Family;Available 24 hours/day Type of Home: Mobile home Home Access: Stairs to enter Entrance Stairs-Number of Steps: 3 Entrance Stairs-Rails: None Home Layout: One level     Bathroom Shower/Tub: Tub/shower unit Shower/tub characteristics: Curtain FirefighterBathroom Toilet: Standard     Home Equipment: Environmental consultantWalker - 4 wheels;Walker - 2 wheels;Cane - single point;Bedside commode;Shower seat;Wheelchair - manual;Hospital bed   Additional Comments: History given by husband. Husband confirms that they have all the equipment they need      Prior Functioning/Environment Level of Independence: Needs assistance  Gait / Transfers Assistance Needed: Husband reports that she was walking across the house 2x a day with RW min guard, Husband/daughter providing transfer assitance ADL's / Homemaking Assistance Needed: Husband/daughter assists 24/7 with transfers/dressing/bathroom   Comments: Husband reports that he gets her OOB, dressed, and that she is present in the "living room" for activity during the day.        OT Problem List: Decreased strength;Decreased range of motion;Decreased activity tolerance;Decreased safety awareness;Pain   OT Treatment/Interventions:      OT Goals(Current goals can be found in the care plan section) Acute Rehab OT Goals Patient Stated Goal: none stated ADL Goals Pt Will Perform Grooming: with supervision;with caregiver independent in assisting;sitting Additional ADL Goal #1: Caregiver will recall 3 ways to conserve energy and reduce burden of care during ADLs  OT Frequency: Min 2X/week   Barriers to D/C:            Co-evaluation PT/OT/SLP Co-Evaluation/Treatment: Yes Reason for Co-Treatment: Complexity of the patient's impairments (multi-system involvement);For patient/therapist  safety;Necessary to address cognition/behavior during functional activity   OT goals addressed during session: ADL's and self-care      End of Session Nurse Communication: Mobility status;Other (comment) (bandage over pressure ulcer looked clean and intact)  Activity Tolerance: Patient limited by lethargy (Pt very difficult to arouse this session.) Patient left: in bed;with family/visitor present;with nursing/sitter in room   Time: 0822-0858 OT Time Calculation (min): 36 min Charges:  OT General Charges $OT Visit: 1 Procedure OT Evaluation $OT Eval Moderate Complexity: 1 Procedure G-Codes:    Evern BioLaura J Odessie Polzin 08/07/2016, 10:28 AM Sherryl MangesLaura Bently Wyss OTR/L [phone]

## 2016-08-07 NOTE — Progress Notes (Signed)
Patient ID: Sharon [name], female   DOB: 03/02/1929, 83 y.o.   MRN: [id number] Subjective: 2 Days Post-Op Procedure(s) (LRB): IRRIGATION AND DEBRIDEMENT HIP (Right) IRRIGATION AND DEBRIDEMENT LABIAL ABSCESS (Right)    Patient sleeping this am with hand mits on indicating some agitation.  Spoke with husband in room about status and hopeful discharge plans  Objective:   VITALS:   Vitals:   08/06/16 1952 08/07/16 0540  BP: 122/65 (!) 163/73  Pulse: 82 72  Resp: 18 18  Temp: 97.7 F (36.5 C) 98.1 F (36.7 C)    Neurovascular intact Incision: dressing C/D/I - post surgical VAC dressing in place  LABS  Recent Labs  08/05/16 0704 08/06/16 0659 08/07/16 0606  HGB 12.4 11.4* 11.8*  HCT 37.4 33.8* 35.3*  WBC 9.8 9.0 8.2  PLT 202 194 210     Recent Labs  08/05/16 0704 08/06/16 0659 08/07/16 0606  NA 141 134* 139  K 3.1* 3.8 3.5  BUN 11 9 8   CREATININE 0.73 0.75 0.72  GLUCOSE 184* 348* 180*    No results for input(s): LABPT, INR in the last 72 hours.   Assessment/Plan: 2 Days Post-Op Procedure(s) (LRB): IRRIGATION AND DEBRIDEMENT HIP (Right) IRRIGATION AND DEBRIDEMENT LABIAL ABSCESS (Right)   Advance diet Up with therapy  Discharge plans per [facility] tomorrow likely if she remains stable

## 2016-08-07 NOTE — Evaluation (Signed)
Physical Therapy Evaluation Patient Details Name: Sharon [name] MRN: [id number] DOB: 1929/04/22 Today's Date: 08/07/2016   History of Present Illness  83 yo F who presents post-op IRRIGATION AND DEBRIDEMENT HIP (Right) IRRIGATION AND DEBRIDEMENT LABIAL ABSCESS (Right). Pt with a significant PMH of T2DM (not managed well), currently on insulin, and Alzheimer's, hyperglycemic, history of UTIs, ongoing pressure ulcer issues.  Clinical Impression  Pt is POD 2 following the above procedure. Prior to admission pt was dependent on family and husband for majority of ADLs and IADLs but was performing short distance gait with her husband throughout their home. Pt has significant cognitive deficits related to Alzheimer's dementia which appears to have worsened with current hospital stay as pt is very lethargic and not responsive to majority of therapy questions. Pt may benefit from consult for palliative care due to current cognitive and functional status. Pt will benefit from continuing to be seen acutely in order to address below deficits and assist with family education and return to home environment.      Follow Up Recommendations Home health PT;Supervision/Assistance - 24 hour;Supervision for mobility/OOB    Equipment Recommendations  None recommended by PT    Recommendations for Other Services Other (comment) (possible palliative care consult)     Precautions / Restrictions Precautions Precautions: Fall Restrictions Weight Bearing Restrictions: Yes RLE Weight Bearing: Weight bearing as tolerated Other Position/Activity Restrictions: positional rotations due to pressure ulcers      Mobility  Bed Mobility Overal bed mobility: Needs Assistance Bed Mobility: Rolling Rolling: +2 for physical assistance;Total assist;+2 for safety/equipment         General bed mobility comments: Pt total assist, rolling to L side unable to assist with verbal cues and physical cues  Transfers                  General transfer comment: not attempted this session  Ambulation/Gait             General Gait Details: No attempted this visit  Stairs            Wheelchair Mobility    Modified Rankin (Stroke Patients Only)       Balance                                             Pertinent Vitals/Pain Pain Assessment: Faces Faces Pain Scale: Hurts little more Pain Location: no stated by Pt - R hip Pain Descriptors / Indicators: Grimacing;Operative site guarding Pain Intervention(s): Limited activity within patient's tolerance;Repositioned    Home Living Family/patient expects to be discharged to:: Private residence Living Arrangements: Spouse/significant other;Children Available Help at Discharge: Family;Available 24 hours/day Type of Home: Mobile home Home Access: Stairs to enter Entrance Stairs-Rails: None Entrance Stairs-Number of Steps: 3 Home Layout: One level Home Equipment: Walker - 4 wheels;Walker - 2 wheels;Cane - single point;Bedside commode;Shower seat;Wheelchair - manual;Hospital bed Additional Comments: History given by husband. Husband confirms that they have all the equipment they need    Prior Function Level of Independence: Needs assistance   Gait / Transfers Assistance Needed: Husband reports that she was walking across the house 2x a day with RW min guard, Husband/daughter providing transfer assitance  ADL's / Homemaking Assistance Needed: Husband/daughter assists 24/7 with transfers/dressing/bathroom  Comments: Husband reports that he gets her OOB, dressed, and that she is present in the "living room" for activity during  the day.     Hand Dominance        Extremity/Trunk Assessment   Upper Extremity Assessment Upper Extremity Assessment: Defer to OT evaluation    Lower Extremity Assessment Lower Extremity Assessment: RLE deficits/detail RLE Deficits / Details: deficits in strength and ROM        Communication    Communication: Other (comment) (bilingual, slow processing time. One word responses. )  Cognition Arousal/Alertness: Lethargic Behavior During Therapy: Flat affect Overall Cognitive Status: History of cognitive impairments - at baseline                 General Comments: Pt has end stage dementia/Alzheimers. Pt largely non-responsive this session (potentially due to medications) husband answers all questions.    General Comments      Exercises Total Joint Exercises Ankle Circles/Pumps: PROM;Both;5 reps;Supine Heel Slides: PROM;Both;10 reps;Supine Hip ABduction/ADduction: PROM;Both;10 reps;Supine   Assessment/Plan    PT Assessment Patient needs continued PT services  PT Problem List Decreased strength;Decreased range of motion;Decreased activity tolerance;Decreased balance;Decreased mobility;Decreased knowledge of use of DME          PT Treatment Interventions DME instruction;Gait training;Functional mobility training;Therapeutic activities;Therapeutic exercise;Balance training;Patient/family education    PT Goals (Current goals can be found in the Care Plan section)  Acute Rehab PT Goals Patient Stated Goal: none stated PT Goal Formulation: Patient unable to participate in goal setting Time For Goal Achievement: 08/14/16 Potential to Achieve Goals: Poor    Frequency Min 5X/week   Barriers to discharge Other (comment) Pt will requrie a lot of assistance. Family feels confident they can care for pt at home.     Co-evaluation PT/OT/SLP Co-Evaluation/Treatment: Yes Reason for Co-Treatment: Complexity of the patient's impairments (multi-system involvement);For patient/therapist safety PT goals addressed during session: Mobility/safety with mobility         End of Session   Activity Tolerance: Patient limited by lethargy Patient left: in bed;with call bell/phone within reach;with family/visitor present;with nursing/sitter in room Nurse Communication: Mobility  status         Time: 0822-0858 PT Time Calculation (min) (ACUTE ONLY): 36 min   Charges:   PT Evaluation $PT Eval Moderate Complexity: 1 Procedure     PT G Codes:        Colin Broach PT, DPT  [phone]  08/07/2016, 4:53 PM
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
## 2016-08-07 NOTE — Plan of Care (Signed)
Problem: Safety: Goal: Ability to remain free from injury will improve Outcome: Progressing Sitter at bedside  Problem: Pain Managment: Goal: General experience of comfort will improve Outcome: Progressing Denies pain  Problem: Skin Integrity: Goal: Risk for impaired skin integrity will decrease Outcome: Progressing Wound vac on at 77 without complication

## 2016-08-07 NOTE — Progress Notes (Addendum)
   Subjective:  Pt is POD#2 s/p I&D of right hip as well as labial abscess   This morning, she is sleeping. No acute events overnight. Husband at bedside.  Objective: Vital signs in last 24 hours: Vitals:   08/06/16 0437 08/06/16 1509 08/06/16 1952 08/07/16 0540  BP: (!) 127/46 122/63 122/65 (!) 163/73  Pulse: 68 62 82 72  Resp: 18 18 18 18   Temp: 97.7 F (36.5 C) 97.9 F (36.6 C) 97.7 F (36.5 C) 98.1 F (36.7 C)  TempSrc: Axillary Axillary Axillary Axillary  SpO2: 94% 99% 99% 100%  Weight:      Height:        Intake/Output Summary (Last 24 hours) at 08/07/16 1032 Last data filed at 08/07/16 0830  Gross per 24 hour  Intake              240 ml  Output                0 ml  Net              240 ml    Physical Exam General appearance: Thin and malnurished-appearing elderly woman comfortably asleep, Cardiovascular: Regular rate and rhythm, no murmurs, rubs, gallops Respiratory/Chest: anterior lung fields clear to auscultation Abdomen: Bowel sounds present, soft, non-tender, non-distended Ext: drain placed over the right hip, minimal drainage , no erythema or tenderness  GU: penrose drain present on the labia   Labs / Imaging / Procedures: CBC Latest Ref Rng & Units 08/07/2016 08/06/2016 08/05/2016  WBC 4.0 - 10.5 K/uL 8.2 9.0 9.8  Hemoglobin 12.0 - 15.0 g/dL 11.8(L) 11.4(L) 12.4  Hematocrit 36.0 - 46.0 % 35.3(L) 33.8(L) 37.4  Platelets 150 - 400 K/uL 210 194 202   BMP Latest Ref Rng & Units 08/07/2016 08/06/2016 08/05/2016  Glucose 65 - 99 mg/dL 454(U180(H) 981(X348(H) 914(N184(H)  BUN 6 - 20 mg/dL 8 9 11   Creatinine 0.44 - 1.00 mg/dL 8.290.72 5.620.75 1.300.73  Sodium 135 - 145 mmol/L 139 134(L) 141  Potassium 3.5 - 5.1 mmol/L 3.5 3.8 3.1(L)  Chloride 101 - 111 mmol/L 103 101 106  CO2 22 - 32 mmol/L 28 26 27   Calcium 8.9 - 10.3 mg/dL 9.0 8.9 9.2   No results found.  Assessment/Plan:  Right hip wound dehiscence s/p I&D: Pt is POD2. She is afebrile and no leukocytosis. The intraop cultures  were mislabeled and orthopedics have already spoken to micro lab. Synovial right hip culture shows moderate WBC- gram positive cocci in clusters. 2850 WBC cell count with 82% neutrophils. Blood cultures from 1/10 NGTD . Synovial fluid grows Klebsiella sensitive to cipro and cefazolin  -orthopedics following and appreciate recs -switched to keflex  -Tramadol 50 mg PRN for pain  Labial abscess: While doing the right hip I&D, a labial abscess was noted which was drained by general surgery, and a penrose drain si currently present  -gen surg following -continue vanc    T2DM: Patient on home regimen of Novolog 70/30 18U BID as this is easier regimen for her to allow decreased sticks; her last A1C in Nov 2017 was 10.5. On admission glucose was elevated to 400's which could be exacerbated by wound infection. Currently CBGs in 300s  -increased novolog 70//30 to 18 units BID -re instituted SSI-S to improve her glycemic control for better wound healing    Dispo: Anticipated discharge in approximately 2-3 day(s).   LOS: 4 days   Deneise LeverParth Ceirra Belli, MD 08/07/2016, 10:32 AM

## 2016-08-07 NOTE — Progress Notes (Signed)
Internal Medicine Attending:   I saw and examined the patient. I reviewed the resident's note and I agree with the resident's findings and plan as documented in the resident's note.  83 year old woman admitted with wound infection and dehiscence of recent right hip replacement. Synovial right hip fluid growing klebsiella sens to cefazolin. On rounds this morning she was asleep in bed a little difficult to arouse. Still has very poor functional status. I talked to her husband at some length. They strongly desired to bring her home. I asked them to work with physical therapy and be realistic about the amount of support they'll be able to provide the patient. Plan to transition antibiotics to oral cephalexin for a ten-day course. Still has a wound VAC in place which is draining minimal fluid.

## 2016-08-08 LAB — BASIC METABOLIC PANEL
ANION GAP: 9 (ref 5–15)
BUN: 8 mg/dL (ref 6–20)
CHLORIDE: 100 mmol/L — AB (ref 101–111)
CO2: 30 mmol/L (ref 22–32)
Calcium: 9.3 mg/dL (ref 8.9–10.3)
Creatinine, Ser: 0.71 mg/dL (ref 0.44–1.00)
GLUCOSE: 184 mg/dL — AB (ref 65–99)
Potassium: 3.4 mmol/L — ABNORMAL LOW (ref 3.5–5.1)
Sodium: 139 mmol/L (ref 135–145)

## 2016-08-08 LAB — GLUCOSE, CAPILLARY
Glucose-Capillary: 165 mg/dL — ABNORMAL HIGH (ref 65–99)
Glucose-Capillary: 176 mg/dL — ABNORMAL HIGH (ref 65–99)
Glucose-Capillary: 287 mg/dL — ABNORMAL HIGH (ref 65–99)
Glucose-Capillary: 381 mg/dL — ABNORMAL HIGH (ref 65–99)
Glucose-Capillary: 231 mg/dL — ABNORMAL HIGH (ref 65–99)

## 2016-08-08 LAB — CULTURE, BLOOD (ROUTINE X 2): CULTURE: NO GROWTH

## 2016-08-08 LAB — PATHOLOGIST SMEAR REVIEW

## 2016-08-08 NOTE — Progress Notes (Signed)
Patient ID: [name], female   DOB: Mar 31, 1929, 83 y.o.   MRN: [id number]          [facility]    Date of Admission:  08/03/2016   Total days of antibiotics 5        Day 2 doxycycline and ciprofloxacin  Her superficial right hip wound infections have grown MRSA sensitive to doxycycline and Klebsiella sensitive to ciprofloxacin. Cultures from her right hip joint remain negative. I recommend continuing doxycycline and ciprofloxacin for 12 more days. This should be sufficient to cure her superficial wound infection and labial abscess. I will sign off now.         Cliffton AstersJohn Latarshia Jersey, MD [facility] [phone] pager   [phone] cell 08/08/2016 11:47 AM

## 2016-08-08 NOTE — Progress Notes (Signed)
[facility] Progress Note  3 Days Post-Op  Subjective: Pleasantly confused. Denies pain  Objective: Vital signs in last 24 hours: Temp:  [97.7 F (36.5 C)-98.1 F (36.7 C)] 97.7 F (36.5 C) (01/15 0447) Pulse Rate:  [64-76] 76 (01/15 0447) Resp:  [16] 16 (01/15 0447) BP: (122-142)/(50-63) 142/63 (01/15 0447) SpO2:  [96 %-100 %] 100 % (01/15 0447) Last BM Date: 08/05/16  Intake/Output from previous day: 01/14 0701 - 01/15 0700 In: 840 [P.O.:840] Out: -  Intake/Output this shift: No intake/output data recorded.  PE: Gen:  NAD Pulm:  Normal effort Abd: Soft, NT GU: left labial wound c/d/i. No surrounding erythema and minimal induration. Minimal serosanguinous drainage. Penrose removed without complication.  Lab Results:   Recent Labs  08/06/16 0659 08/07/16 0606  WBC 9.0 8.2  HGB 11.4* 11.8*  HCT 33.8* 35.3*  PLT 194 210   BMET  Recent Labs  08/07/16 0606 08/08/16 0424  NA 139 139  K 3.5 3.4*  CL 103 100*  CO2 28 30  GLUCOSE 180* 184*  BUN 8 8  CREATININE 0.72 0.71  CALCIUM 9.0 9.3   CMP     Component Value Date/Time   NA 139 08/08/2016 0424   K 3.4 (L) 08/08/2016 0424   CL 100 (L) 08/08/2016 0424   CO2 30 08/08/2016 0424   GLUCOSE 184 (H) 08/08/2016 0424   BUN 8 08/08/2016 0424   CREATININE 0.71 08/08/2016 0424   CALCIUM 9.3 08/08/2016 0424   PROT 6.7 08/03/2016 1537   ALBUMIN 2.8 (L) 08/03/2016 1537   AST 16 08/03/2016 1537   ALT 16 08/03/2016 1537   ALKPHOS 122 08/03/2016 1537   BILITOT 0.4 08/03/2016 1537   GFRNONAA >60 08/08/2016 0424   GFRAA >60 08/08/2016 0424   Anti-infectives: Anti-infectives    Start     Dose/Rate Route Frequency Ordered Stop   08/07/16 2000  ciprofloxacin (CIPRO) tablet 500 mg     500 mg Oral 2 times daily 08/07/16 1218     08/07/16 1600  cephALEXin (KEFLEX) capsule 500 mg  Status:  Discontinued     500 mg Oral Every 6 hours 08/07/16 1154 08/07/16 1212   08/07/16 1330  doxycycline (VIBRA-TABS)  tablet 100 mg     100 mg Oral Every 12 hours 08/07/16 1320     08/07/16 1200  ciprofloxacin (CIPRO) tablet 500 mg  Status:  Discontinued     500 mg Oral 2 times daily 08/07/16 1100 08/07/16 1154   08/07/16 1000  sulfamethoxazole-trimethoprim (BACTRIM DS,SEPTRA DS) 800-160 MG per tablet 1 tablet  Status:  Discontinued     1 tablet Oral Every 12 hours 08/07/16 0734 08/07/16 1320   08/05/16 1215  vancomycin (VANCOCIN) IVPB 1000 mg/200 mL premix  Status:  Discontinued     1,000 mg 200 mL/hr over 60 Minutes Intravenous On call to O.R. 08/05/16 0723 08/05/16 0725   08/04/16 1400  vancomycin (VANCOCIN) IVPB 750 mg/150 ml premix  Status:  Discontinued     750 mg 150 mL/hr over 60 Minutes Intravenous Every 24 hours 08/04/16 1254 08/07/16 1100   08/04/16 1315  levofloxacin (LEVAQUIN) IVPB 500 mg     500 mg 100 mL/hr over 60 Minutes Intravenous  Once 08/04/16 1301 08/04/16 1512     Assessment/Plan  1.  IRRIGATION AND DEBRIDEMENT LABIAL ABSCESS - 08/05/2016 - [place]             penrose drain removed  Clean daily with mild soap and water.   antibiotics  per ID   2. IRRIGATION AND DEBRIDEMENT Right HIP - 08/05/2016 - [place]             History of right hip replacement on 07/07/2016             On Vanc  3.  DM 4.  Chronic decubitus ulcers 5.  Dementia 6.  MRSA 7.  DVT prophylaxis - Lovenox   LOS: 5 days    Adam Phenix , [facility] 08/08/2016, 9:49 AM Pager: [phone] Consults: [phone] Mon-Fri 7:00 am-4:30 pm Sat-Sun 7:00 am-11:30 am

## 2016-08-08 NOTE — Progress Notes (Addendum)
Subjective: Sleeping comfortably today, states no pain. Per husband doing well but does not like the mittens. Apparently was picking at wound sites and IVs. Prevena wound vac over right hip intact, no surrounding erythema. Interested in prospect of going home tomorrow.   Objective: Vital signs in last 24 hours: Vitals:   08/07/16 0540 08/07/16 1320 08/07/16 2014 08/08/16 0447  BP: (!) 163/73 (!) 136/50 122/63 (!) 142/63  Pulse: 72 64 72 76  Resp: 18 16 16 16   Temp: 98.1 F (36.7 C) 97.7 F (36.5 C) 98.1 F (36.7 C) 97.7 F (36.5 C)  TempSrc: Axillary Axillary Axillary Axillary  SpO2: 100% 100% 96% 100%  Weight:      Height:        Intake/Output Summary (Last 24 hours) at 08/08/16 0711 Last data filed at 08/07/16 2300  Gross per 24 hour  Intake              840 ml  Output                0 ml  Net              840 ml    Physical Exam General appearance: Thin and malnurished-appearing elderly woman comfortably asleep, rouses to voice and simple questions Cardiovascular: Regular rate and rhythm, no murmurs, rubs, gallops Respiratory/Chest: anterior lung fields clear to auscultation Abdomen: Bowel sounds present, soft, non-tender, non-distended Ext: drain placed over the right hip, minimal drainage , no erythema or tenderness  GU: penrose drain present on the labia, no visible erythema  Labs / Imaging / Procedures: CBC Latest Ref Rng & Units 08/07/2016 08/06/2016 08/05/2016  WBC 4.0 - 10.5 K/uL 8.2 9.0 9.8  Hemoglobin 12.0 - 15.0 g/dL 11.8(L) 11.4(L) 12.4  Hematocrit 36.0 - 46.0 % 35.3(L) 33.8(L) 37.4  Platelets 150 - 400 K/uL 210 194 202   BMP Latest Ref Rng & Units 08/08/2016 08/07/2016 08/06/2016  Glucose 65 - 99 mg/dL 161(W184(H) 960(A180(H) 540(J348(H)  BUN 6 - 20 mg/dL 8 8 9   Creatinine 0.44 - 1.00 mg/dL 8.110.71 9.140.72 7.820.75  Sodium 135 - 145 mmol/L 139 139 134(L)  Potassium 3.5 - 5.1 mmol/L 3.4(L) 3.5 3.8  Chloride 101 - 111 mmol/L 100(L) 103 101  CO2 22 - 32 mmol/L 30 28 26   Calcium  8.9 - 10.3 mg/dL 9.3 9.0 8.9   No results found.  Assessment/Plan:  Soft tissue infection overlying prosthetic joint s/p I&D: Pt is POD2. She is afebrile and no leukocytosis. The intraop cultures were mislabeled and orthopedics have already spoken to micro lab. Synovial right hip culture shows moderate WBC- gram positive cocci in clusters. 2850 WBC cell count with 82% neutrophils. Blood cultures from 1/10 NGTD . Synovial fluid showing no growth. Superficial wound cultures growing Klebsiella and MRSA sensitive to cipro and cefazolin. Empiric vanc 1/11 to 1/13. - Cipro and Doxycycline started 1/14, continue until 1/27 per ID recs - Orthopedics following and appreciate recs - Tramadol 50 mg PRN for pain  Labial abscess: While doing the right hip I&D, a labial abscess was noted which was drained by general surgery, and a penrose drain si currently present -gen surg following, appreciate recs, penrose removed by gen surg today - continue daily washing of labial open incisions -continue vanc    T2DM: Patient on home regimen of Novolog 70/30 18U BID as this is easier regimen for her to allow decreased sticks; her last A1C in Nov 2017 was 10.5. On admission glucose was elevated to  400's which could be exacerbated by wound. infection. Currently CBGs in 300s  -increased novolog 70//30 to 18 units BID -re instituted SSI-S to improve her glycemic control for better wound healing   PT/OT rec 24-hour assist, HH PT/OT - family not interested in SNF  Dispo: Anticipated discharge in approximately 2-3 day(s).   LOS: 5 days   Althia Forts, MD 08/08/2016, 7:11 AM

## 2016-08-08 NOTE — Progress Notes (Signed)
Occupational Therapy Treatment Patient Details Name: Sharon [name] MRN: [id number] DOB: August 07, 1928 Today's Date: 08/08/2016    History of present illness 83 yo F who presents post-op IRRIGATION AND DEBRIDEMENT HIP (Right) IRRIGATION AND DEBRIDEMENT LABIAL ABSCESS (Right). Pt with a significant PMH of T2DM (not managed well), currently on insulin, and Alzheimer's, hyperglycemic, history of UTIs, ongoing pressure ulcer issues.   OT comments  Pt making progress towards OT goals, continue plan of care for now. Continue to recommend SNF, but spouse continues to refuse. During session, therapist talked with spouse about patient's care and asked spouse if she has gotten OOB since this hospitalization. Spouse stated, "they got her into the shower room this morning to take a full shower". Therapist notified patient's RN of this and patient's spouse's poor awareness to situation. Pt currently requires +2 for bed mobility and is unsafe to get in the shower at this time.    Follow Up Recommendations  Supervision/Assistance - 24 hour;Other (comment);SNF (HHOT if spouse continues to refuse SNF placement)    Equipment Recommendations  None recommended by OT    Recommendations for Other Services  None at this time   Precautions / Restrictions Precautions Precautions: Fall Restrictions Weight Bearing Restrictions: Yes RLE Weight Bearing: Weight bearing as tolerated Other Position/Activity Restrictions: positional rotations due to pressure ulcers    Mobility Bed Mobility General bed mobility comments: Did not occur  Transfers General transfer comment: Did not occur        ADL Overall ADL's : Needs assistance/impaired General ADL Comments: Pt totally dependent in all ADL; Pt wore adult diapers at baseline, today performed bed level HEP to increase her overall functional strength and endurance to decrease burden of care with mobility.      Cognition   Behavior During Therapy: Flat  affect Overall Cognitive Status: History of cognitive impairments - at baseline General Comments: Pt has end stage dementia/Alzheimers.       Exercises General Exercises - Upper Extremity Shoulder Flexion: AROM;15 reps;Both;Supine (2 sets) Shoulder Extension: AROM;Both;15 reps;Supine (2 sets) Shoulder ABduction: AROM;Both;Supine;15 reps (2 sets) Shoulder ADduction: AROM;15 reps;Both;Supine (2 sets) Shoulder Horizontal ABduction: AROM;15 reps;Supine (2 sets) Shoulder Horizontal ADduction: AROM;Both;15 reps;Supine (2 sets)           Pertinent Vitals/ Pain       Pain Assessment: No/denies pain  Frequency  Min 2X/week      Progress Toward Goals  OT Goals(current goals can now be found in the care plan section)  Progress towards OT goals: Progressing toward goals  Acute Rehab OT Goals Patient Stated Goal: none stated  Plan Discharge plan remains appropriate       End of Session   Activity Tolerance Patient tolerated treatment well   Patient Left in bed;with family/visitor present   Nurse Communication Mobility status;Other (comment) (Husband comment on how pt performed shower this am)     Time: 1351-1410 OT Time Calculation (min): 19 min  Charges: OT General Charges $OT Visit: 1 Procedure OT Treatments $Therapeutic Exercise: 8-22 mins  Edwin CapPatricia Aftan Vint , MS, OTR/L, [place] Pager: [phone] 08/08/2016, 2:30 PM

## 2016-08-08 NOTE — Progress Notes (Signed)
Subjective:  Patient reports pain as mild.  No c/o.  Objective:   VITALS:   Vitals:   08/07/16 0540 08/07/16 1320 08/07/16 2014 08/08/16 0447  BP: (!) 163/73 (!) 136/50 122/63 (!) 142/63  Pulse: 72 64 72 76  Resp: 18 16 16 16   Temp: 98.1 F (36.7 C) 97.7 F (36.5 C) 98.1 F (36.7 C) 97.7 F (36.5 C)  TempSrc: Axillary Axillary Axillary Axillary  SpO2: 100% 100% 96% 100%  Weight:      Height:        NAD ABD soft Sensation intact distally Intact pulses distally Dorsiflexion/Plantar flexion intact Incision: dressing C/D/I Compartment soft VAC intact   Lab Results  Component Value Date   WBC 8.2 08/07/2016   HGB 11.8 (L) 08/07/2016   HCT 35.3 (L) 08/07/2016   MCV 89.1 08/07/2016   PLT 210 08/07/2016   BMET    Component Value Date/Time   NA 139 08/08/2016 0424   K 3.4 (L) 08/08/2016 0424   CL 100 (L) 08/08/2016 0424   CO2 30 08/08/2016 0424   GLUCOSE 184 (H) 08/08/2016 0424   BUN 8 08/08/2016 0424   CREATININE 0.71 08/08/2016 0424   CALCIUM 9.3 08/08/2016 0424   GFRNONAA >60 08/08/2016 0424   GFRAA >60 08/08/2016 0424      Recent Results (from the past 240 hour(s))  Blood culture (routine x 2)     Status: None   Collection Time: 08/03/16  6:27 PM  Result Value Ref Range Status   Specimen Description BLOOD RIGHT ANTECUBITAL  Final   Special Requests BOTTLES DRAWN AEROBIC AND ANAEROBIC 5CC  Final   Culture NO GROWTH 5 DAYS  Final   Report Status 08/08/2016 FINAL  Final  Blood culture (routine x 2)     Status: None (Preliminary result)   Collection Time: 08/04/16 12:10 AM  Result Value Ref Range Status   Specimen Description BLOOD LEFT ANTECUBITAL  Final   Special Requests IN PEDIATRIC BOTTLE 1CC  Final   Culture NO GROWTH 4 DAYS  Final   Report Status PENDING  Incomplete  Body fluid culture     Status: None   Collection Time: 08/04/16 10:41 AM  Result Value Ref Range Status   Specimen Description FLUID RIGHT HIP  Final   Special Requests NONE   Final   Gram Stain   Final    RARE WBC PRESENT, PREDOMINANTLY PMN NO ORGANISMS SEEN    Culture NO GROWTH 3 DAYS  Final   Report Status 08/07/2016 FINAL  Final  Surgical pcr screen     Status: Abnormal   Collection Time: 08/05/16  1:22 AM  Result Value Ref Range Status   MRSA, PCR POSITIVE (A) NEGATIVE Final    Comment: RESULT CALLED TO, READ BACK BY AND VERIFIED WITH: L LEWIS,RN @0426  08/05/16 MKELLY,MLT    Staphylococcus aureus POSITIVE (A) NEGATIVE Final    Comment:        The Xpert SA Assay (FDA approved for NASAL specimens in patients over 11 years of age), is one component of a comprehensive surveillance program.  Test performance has been validated by [facility] for patients greater than or equal to 39 year old. It is not intended to diagnose infection nor to guide or monitor treatment.   Aerobic/Anaerobic Culture (surgical/deep wound)     Status: None (Preliminary result)   Collection Time: 08/05/16  3:23 PM  Result Value Ref Range Status   Specimen Description SYNOVIAL RIGHT HIP  Final  Special Requests PATIENT ON FOLLOWING VANC  Final   Gram Stain   Final    FEW WBC PRESENT,BOTH PMN AND MONONUCLEAR NO ORGANISMS SEEN    Culture   Final    NO GROWTH 2 DAYS NO ANAEROBES ISOLATED; CULTURE IN PROGRESS FOR 5 DAYS   Report Status PENDING  Incomplete  Aerobic/Anaerobic Culture (surgical/deep wound)     Status: None (Preliminary result)   Collection Time: 08/05/16  3:23 PM  Result Value Ref Range Status   Specimen Description WOUND RIGHT HIP  Final   Special Requests   Final    PER MD SUPERFICIAL FLUID ON SWAB PATIENT ON FOLLOWING VANC   Gram Stain   Final    MODERATE WBC PRESENT, PREDOMINANTLY PMN MODERATE GRAM POSITIVE COCCI IN CLUSTERS    Culture   Final    MODERATE METHICILLIN RESISTANT STAPHYLOCOCCUS AUREUS FEW KLEBSIELLA PNEUMONIAE NO ANAEROBES ISOLATED; CULTURE IN PROGRESS FOR 5 DAYS    Report Status PENDING  Incomplete   Organism ID, Bacteria  KLEBSIELLA PNEUMONIAE  Final   Organism ID, Bacteria METHICILLIN RESISTANT STAPHYLOCOCCUS AUREUS  Final      Susceptibility   Klebsiella pneumoniae - MIC*    AMPICILLIN 16 RESISTANT Resistant     CEFAZOLIN <=4 SENSITIVE Sensitive     CEFEPIME <=1 SENSITIVE Sensitive     CEFTAZIDIME <=1 SENSITIVE Sensitive     CEFTRIAXONE <=1 SENSITIVE Sensitive     CIPROFLOXACIN 1 SENSITIVE Sensitive     GENTAMICIN <=1 SENSITIVE Sensitive     IMIPENEM <=0.25 SENSITIVE Sensitive     TRIMETH/SULFA >=320 RESISTANT Resistant     AMPICILLIN/SULBACTAM 4 SENSITIVE Sensitive     PIP/TAZO <=4 SENSITIVE Sensitive     Extended ESBL NEGATIVE Sensitive     * FEW KLEBSIELLA PNEUMONIAE   Methicillin resistant staphylococcus aureus - MIC*    CIPROFLOXACIN <=0.5 SENSITIVE Sensitive     ERYTHROMYCIN >=8 RESISTANT Resistant     GENTAMICIN <=0.5 SENSITIVE Sensitive     OXACILLIN >=4 RESISTANT Resistant     TETRACYCLINE <=1 SENSITIVE Sensitive     VANCOMYCIN <=0.5 SENSITIVE Sensitive     TRIMETH/SULFA <=10 SENSITIVE Sensitive     CLINDAMYCIN <=0.25 SENSITIVE Sensitive     RIFAMPIN <=0.5 SENSITIVE Sensitive     Inducible Clindamycin NEGATIVE Sensitive     * MODERATE METHICILLIN RESISTANT STAPHYLOCOCCUS AUREUS     Assessment/Plan: 3 Days Post-Op   Active Problems:   Poorly controlled type 2 diabetes mellitus (HCC)   Infected surgical wound   Infection of prosthetic hip joint (HCC)   Pressure injury of skin   Malnutrition of moderate degree   Surgical site infection   Difficulty in walking, not elsewhere classified   Labial abscess   WBAT with walker Superficial SSI (MRSA and Klebsiella): 2 weeks of cipro and doxy per ID Deep cultures NGTD Dispo: will convert Prevena to portable unit prior to d/c, please call    Carver Murakami, Cloyde ReamsBrian James 08/08/2016, 1:44 PM   Samson FredericBrian Saabir Blyth, MD Cell [phone]

## 2016-08-09 DIAGNOSIS — N764 Abscess of vulva: Secondary | ICD-10-CM

## 2016-08-09 LAB — CULTURE, BLOOD (ROUTINE X 2): Culture: NO GROWTH

## 2016-08-09 LAB — AEROBIC/ANAEROBIC CULTURE W GRAM STAIN (SURGICAL/DEEP WOUND)

## 2016-08-09 LAB — GLUCOSE, CAPILLARY
GLUCOSE-CAPILLARY: 344 mg/dL — AB (ref 65–99)
GLUCOSE-CAPILLARY: 340 mg/dL — AB (ref 65–99)
GLUCOSE-CAPILLARY: 247 mg/dL — AB (ref 65–99)
Glucose-Capillary: 156 mg/dL — ABNORMAL HIGH (ref 65–99)
Glucose-Capillary: 162 mg/dL — ABNORMAL HIGH (ref 65–99)

## 2016-08-09 LAB — AEROBIC/ANAEROBIC CULTURE (SURGICAL/DEEP WOUND)

## 2016-08-09 MED ORDER — INSULIN ASPART PROT & ASPART (70-30 MIX) 100 UNIT/ML ~~LOC~~ SUSP
20.0000 [IU] | Freq: Two times a day (BID) | SUBCUTANEOUS | Status: DC
  Administered 2016-08-10: 20 [IU] via SUBCUTANEOUS

## 2016-08-09 NOTE — Progress Notes (Signed)
Subjective:  Patient reports pain as mild.  No c/o.  Objective:   VITALS:   Vitals:   08/07/16 2014 08/08/16 0447 08/08/16 1500 08/09/16 0446  BP: 122/63 (!) 142/63 132/64 133/79  Pulse: 72 76 72 60  Resp: 16 16 16 16   Temp: 98.1 F (36.7 C) 97.7 F (36.5 C) 98.4 F (36.9 C) 97.3 F (36.3 C)  TempSrc: Axillary Axillary Axillary Axillary  SpO2: 96% 100% 96% 99%  Weight:      Height:        NAD ABD soft Sensation intact distally Intact pulses distally Dorsiflexion/Plantar flexion intact Incision: dressing C/D/I Compartment soft VAC intact   Lab Results  Component Value Date   WBC 8.2 08/07/2016   HGB 11.8 (L) 08/07/2016   HCT 35.3 (L) 08/07/2016   MCV 89.1 08/07/2016   PLT 210 08/07/2016   BMET    Component Value Date/Time   NA 139 08/08/2016 0424   K 3.4 (L) 08/08/2016 0424   CL 100 (L) 08/08/2016 0424   CO2 30 08/08/2016 0424   GLUCOSE 184 (H) 08/08/2016 0424   BUN 8 08/08/2016 0424   CREATININE 0.71 08/08/2016 0424   CALCIUM 9.3 08/08/2016 0424   GFRNONAA >60 08/08/2016 0424   GFRAA >60 08/08/2016 0424      Recent Results (from the past 240 hour(s))  Blood culture (routine x 2)     Status: None   Collection Time: 08/03/16  6:27 PM  Result Value Ref Range Status   Specimen Description BLOOD RIGHT ANTECUBITAL  Final   Special Requests BOTTLES DRAWN AEROBIC AND ANAEROBIC 5CC  Final   Culture NO GROWTH 5 DAYS  Final   Report Status 08/08/2016 FINAL  Final  Blood culture (routine x 2)     Status: None (Preliminary result)   Collection Time: 08/04/16 12:10 AM  Result Value Ref Range Status   Specimen Description BLOOD LEFT ANTECUBITAL  Final   Special Requests IN PEDIATRIC BOTTLE 1CC  Final   Culture NO GROWTH 4 DAYS  Final   Report Status PENDING  Incomplete  Body fluid culture     Status: None   Collection Time: 08/04/16 10:41 AM  Result Value Ref Range Status   Specimen Description FLUID RIGHT HIP  Final   Special Requests NONE  Final   Gram Stain   Final    RARE WBC PRESENT, PREDOMINANTLY PMN NO ORGANISMS SEEN    Culture NO GROWTH 3 DAYS  Final   Report Status 08/07/2016 FINAL  Final  Surgical pcr screen     Status: Abnormal   Collection Time: 08/05/16  1:22 AM  Result Value Ref Range Status   MRSA, PCR POSITIVE (A) NEGATIVE Final    Comment: RESULT CALLED TO, READ BACK BY AND VERIFIED WITH: L LEWIS,RN @0426  08/05/16 MKELLY,MLT    Staphylococcus aureus POSITIVE (A) NEGATIVE Final    Comment:        The Xpert SA Assay (FDA approved for NASAL specimens in patients over 81 years of age), is one component of a comprehensive surveillance program.  Test performance has been validated by [facility] for patients greater than or equal to [address] year old. It is not intended to diagnose infection nor to guide or monitor treatment.   Aerobic/Anaerobic Culture (surgical/deep wound)     Status: None (Preliminary result)   Collection Time: 08/05/16  3:23 PM  Result Value Ref Range Status   Specimen Description SYNOVIAL RIGHT HIP  Final   Special Requests PATIENT  ON FOLLOWING VANC  Final   Gram Stain   Final    FEW WBC PRESENT,BOTH PMN AND MONONUCLEAR NO ORGANISMS SEEN    Culture   Final    NO GROWTH 2 DAYS NO ANAEROBES ISOLATED; CULTURE IN PROGRESS FOR 5 DAYS   Report Status PENDING  Incomplete  Aerobic/Anaerobic Culture (surgical/deep wound)     Status: None (Preliminary result)   Collection Time: 08/05/16  3:23 PM  Result Value Ref Range Status   Specimen Description WOUND RIGHT HIP  Final   Special Requests   Final    PER MD SUPERFICIAL FLUID ON SWAB PATIENT ON FOLLOWING VANC   Gram Stain   Final    MODERATE WBC PRESENT, PREDOMINANTLY PMN MODERATE GRAM POSITIVE COCCI IN CLUSTERS    Culture   Final    MODERATE METHICILLIN RESISTANT STAPHYLOCOCCUS AUREUS FEW KLEBSIELLA PNEUMONIAE NO ANAEROBES ISOLATED; CULTURE IN PROGRESS FOR 5 DAYS    Report Status PENDING  Incomplete   Organism ID, Bacteria KLEBSIELLA  PNEUMONIAE  Final   Organism ID, Bacteria METHICILLIN RESISTANT STAPHYLOCOCCUS AUREUS  Final      Susceptibility   Klebsiella pneumoniae - MIC*    AMPICILLIN 16 RESISTANT Resistant     CEFAZOLIN <=4 SENSITIVE Sensitive     CEFEPIME <=1 SENSITIVE Sensitive     CEFTAZIDIME <=1 SENSITIVE Sensitive     CEFTRIAXONE <=1 SENSITIVE Sensitive     CIPROFLOXACIN 1 SENSITIVE Sensitive     GENTAMICIN <=1 SENSITIVE Sensitive     IMIPENEM <=0.25 SENSITIVE Sensitive     TRIMETH/SULFA >=320 RESISTANT Resistant     AMPICILLIN/SULBACTAM 4 SENSITIVE Sensitive     PIP/TAZO <=4 SENSITIVE Sensitive     Extended ESBL NEGATIVE Sensitive     * FEW KLEBSIELLA PNEUMONIAE   Methicillin resistant staphylococcus aureus - MIC*    CIPROFLOXACIN <=0.5 SENSITIVE Sensitive     ERYTHROMYCIN >=8 RESISTANT Resistant     GENTAMICIN <=0.5 SENSITIVE Sensitive     OXACILLIN >=4 RESISTANT Resistant     TETRACYCLINE <=1 SENSITIVE Sensitive     VANCOMYCIN <=0.5 SENSITIVE Sensitive     TRIMETH/SULFA <=10 SENSITIVE Sensitive     CLINDAMYCIN <=0.25 SENSITIVE Sensitive     RIFAMPIN <=0.5 SENSITIVE Sensitive     Inducible Clindamycin NEGATIVE Sensitive     * MODERATE METHICILLIN RESISTANT STAPHYLOCOCCUS AUREUS     Assessment/Plan: 4 Days Post-Op   Active Problems:   Poorly controlled type 2 diabetes mellitus (HCC)   Infected surgical wound   Infection of prosthetic hip joint (HCC)   Pressure injury of skin   Malnutrition of moderate degree   Surgical site infection   Difficulty in walking, not elsewhere classified   Labial abscess   WBAT with walker Superficial SSI (MRSA and Klebsiella): 2 weeks of cipro and doxy per ID Deep cultures NGTD Dispo: will convert Prevena to portable unit prior to d/c, f/u with me in 7 days   Coreena Rubalcava, Cloyde Reams 08/09/2016, 7:55 AM   Samson Frederic, MD Cell [phone]

## 2016-08-09 NOTE — Progress Notes (Addendum)
Physical Therapy Treatment Patient Details Name: Fabio BeringRamona Scally MRN: [id number] DOB: August 26, 1928 Today's Date: 08/09/2016    History of Present Illness 83 yo F who presents post-op IRRIGATION AND DEBRIDEMENT HIP (Right) IRRIGATION AND DEBRIDEMENT LABIAL ABSCESS (Right). Pt with a significant PMH of T2DM (not managed well), currently on insulin, and Alzheimer's, hyperglycemic, history of UTIs, ongoing pressure ulcer issues.    PT Comments    Patient max A +2/total A for bed mobility and transfers. Husband present and reported he has been assisting pt with all mobility and feeding for six years. Pt will continue to benefit from further skilled PT services in acute setting and at home to address strength and mobility deficits.    Follow Up Recommendations  Home health PT;Supervision/Assistance - 24 hour;Supervision for mobility/OOB     Equipment Recommendations  None recommended by PT    Recommendations for Other Services Other (comment) (possible palliative care consult)     Precautions / Restrictions Precautions Precautions: Fall Restrictions Weight Bearing Restrictions: Yes RLE Weight Bearing: Weight bearing as tolerated Other Position/Activity Restrictions: positional rotations due to pressure ulcers    Mobility  Bed Mobility Overal bed mobility: Needs Assistance Bed Mobility: Supine to Sit     Supine to sit: Max assist;+2 for physical assistance;HOB elevated     General bed mobility comments: assist with all aspects of bed mobility wiht use of bed pad; pt began assisting to roll to side when therapist and tech initiated movement; pt unable to following vc   Transfers Overall transfer level: Needs assistance Equipment used: None Transfers: Stand Pivot Transfers   Stand pivot transfers: Total assist       General transfer comment: pt held onto therapist's bilat UE during transfer; pt resisted standing when initiated   Ambulation/Gait                  Stairs            Wheelchair Mobility    Modified Rankin (Stroke Patients Only)       Balance                                    Cognition Arousal/Alertness: Awake/alert Behavior During Therapy: Flat affect Overall Cognitive Status: History of cognitive impairments - at baseline                 General Comments: Pt has end stage dementia/Alzheimers. Pt responded mostly with yes/no answers.     Exercises      General Comments General comments (skin integrity, edema, etc.): husband present and reported he has been assisting pt with transfers, feeding, etc... for six years       Pertinent Vitals/Pain Pain Assessment: No/denies pain    Home Living                      Prior Function            PT Goals (current goals can now be found in the care plan section) Acute Rehab PT Goals Patient Stated Goal: none stated Progress towards PT goals: Progressing toward goals    Frequency    Min 5X/week      PT Plan Current plan remains appropriate    Co-evaluation             End of Session Equipment Utilized During Treatment: Gait belt Activity Tolerance: Patient tolerated treatment well Patient left: with  call bell/phone within reach;with family/visitor present;in chair;with chair alarm set     Time: [phone] PT Time Calculation (min) (ACUTE ONLY): 23 min  Charges:  $Therapeutic Activity: 23-37 mins                    G Codes:      Derek Mound, PTA Pager: [phone]   08/09/2016, 9:39 AM

## 2016-08-09 NOTE — Progress Notes (Signed)
Subjective: Sitting in bedside chair, much more awake and interactive today. Right hip wound looks good with wound vac in place, minimal drainage. No longer requiring mitten restraints.  Objective: Vital signs in last 24 hours: Vitals:   08/07/16 2014 08/08/16 0447 08/08/16 1500 08/09/16 0446  BP: 122/63 (!) 142/63 132/64 133/79  Pulse: 72 76 72 60  Resp: 16 16 16 16   Temp: 98.1 F (36.7 C) 97.7 F (36.5 C) 98.4 F (36.9 C) 97.3 F (36.3 C)  TempSrc: Axillary Axillary Axillary Axillary  SpO2: 96% 100% 96% 99%  Weight:      Height:        Intake/Output Summary (Last 24 hours) at 08/09/16 1202 Last data filed at 08/08/16 1700  Gross per 24 hour  Intake              240 ml  Output                0 ml  Net              240 ml    Physical Exam General appearance: Thin and malnurished-appearing elderly woman sitting in bedside chair, pleasant and conversational today Cardiovascular: Regular rate and rhythm, no murmurs, rubs, gallops Respiratory/Chest: Lungs clear to ausculation, breathing comfortably Abdomen: Bowel sounds present, soft, non-tender, non-distended Ext: Drain in place over the right hip, minimal drainage, no erythema or tenderness  GU: Labia s/p Penrose drain removal, no visible erythema, nontender  Labs / Imaging / Procedures: CBC Latest Ref Rng & Units 08/07/2016 08/06/2016 08/05/2016  WBC 4.0 - 10.5 K/uL 8.2 9.0 9.8  Hemoglobin 12.0 - 15.0 g/dL 11.8(L) 11.4(L) 12.4  Hematocrit 36.0 - 46.0 % 35.3(L) 33.8(L) 37.4  Platelets 150 - 400 K/uL 210 194 202   BMP Latest Ref Rng & Units 08/08/2016 08/07/2016 08/06/2016  Glucose 65 - 99 mg/dL 657(Q184(H) 469(G180(H) 295(M348(H)  BUN 6 - 20 mg/dL 8 8 9   Creatinine 0.44 - 1.00 mg/dL 8.410.71 3.240.72 4.010.75  Sodium 135 - 145 mmol/L 139 139 134(L)  Potassium 3.5 - 5.1 mmol/L 3.4(L) 3.5 3.8  Chloride 101 - 111 mmol/L 100(L) 103 101  CO2 22 - 32 mmol/L 30 28 26   Calcium 8.9 - 10.3 mg/dL 9.3 9.0 8.9   No results found.  Assessment/Plan:  Soft  tissue infection overlying prosthetic joint s/p I&D: Pt is POD2. She is afebrile and no leukocytosis. The intraop cultures were mislabeled and orthopedics have already spoken to micro lab. Synovial right hip culture shows moderate WBC- gram positive cocci in clusters. 2850 WBC cell count with 82% neutrophils. Blood cultures from 1/10 NGTD . Underwent debridement of skin and subcutaneous tissue of right hip with irrigation with ortho surgery on 1/13. Synovial fluid showing no growth. Superficial wound cultures growing Klebsiella and MRSA sensitive to cipro and cefazolin. Empiric vanc 1/11 to 1/13. - Cipro and Doxycycline started 1/14, continue until 1/27 per ID recs - Orthopedics following and appreciate recs - walker, to convert Prevena to portable, f/up with [facility] - Tramadol 50 mg PRN for pain  Labial abscess: While doing the right hip I&D, a labial abscess was noted which was drained by general surgery, and a penrose drain si currently present -gen surg following, appreciate recs, penrose removed by gen surg today - continue daily washing with mild soapy water  T2DM: Patient on home regimen of Novolog 70/30 18U BID as this is easier regimen for her to allow decreased sticks; her last A1C in Nov 2017 was  10.5. On admission glucose was elevated to 400's which could be exacerbated by wound. infection. Currently CBGs in 300s -increased novolog 70//30 to 20 units BID -SSI-S  PT/OT rec 24-hour assist, HH PT/OT - family not interested in SNF  Dispo: Anticipated discharge in approximately 1-2 day(s).   LOS: 6 days   Sharon Forts, MD 08/09/2016, 12:02 PM

## 2016-08-09 NOTE — Progress Notes (Signed)
  Date: 08/09/2016  Patient name: Sharon BeringRamona Seim  Medical record number: [id number]  Date of birth: 1929/03/27   I have seen and evaluated this patient and I have discussed the plan of care with the house staff. Please see Dr. Henriette CombsJohnson's note for complete details. I concur with his findings.   Likely ok for discharge tomorrow.   Inez CatalinaEmily B Tavie Haseman, MD 08/09/2016, 2:41 PM

## 2016-08-10 LAB — AEROBIC/ANAEROBIC CULTURE W GRAM STAIN (SURGICAL/DEEP WOUND): Culture: NO GROWTH

## 2016-08-10 LAB — GLUCOSE, CAPILLARY
GLUCOSE-CAPILLARY: 258 mg/dL — AB (ref 65–99)
GLUCOSE-CAPILLARY: 300 mg/dL — AB (ref 65–99)
GLUCOSE-CAPILLARY: 371 mg/dL — AB (ref 65–99)
Glucose-Capillary: 225 mg/dL — ABNORMAL HIGH (ref 65–99)
Glucose-Capillary: 357 mg/dL — ABNORMAL HIGH (ref 65–99)

## 2016-08-10 LAB — AEROBIC/ANAEROBIC CULTURE (SURGICAL/DEEP WOUND)

## 2016-08-10 MED ORDER — INSULIN ASPART PROT & ASPART (70-30 MIX) 100 UNIT/ML ~~LOC~~ SUSP
22.0000 [IU] | Freq: Every day | SUBCUTANEOUS | Status: DC
  Administered 2016-08-10 – 2016-08-11 (×2): 22 [IU] via SUBCUTANEOUS

## 2016-08-10 MED ORDER — INSULIN ASPART PROT & ASPART (70-30 MIX) 100 UNIT/ML ~~LOC~~ SUSP
28.0000 [IU] | Freq: Every day | SUBCUTANEOUS | Status: DC
  Administered 2016-08-11 – 2016-08-12 (×2): 28 [IU] via SUBCUTANEOUS
  Filled 2016-08-10: qty 10

## 2016-08-10 NOTE — Progress Notes (Signed)
Physical Therapy Treatment Patient Details Name: Sharon Butler MRN: [id number] DOB: October 03, 1928 Today's Date: 08/10/2016    History of Present Illness 83 yo F who presents post-op IRRIGATION AND DEBRIDEMENT HIP (Right) IRRIGATION AND DEBRIDEMENT LABIAL ABSCESS (Right). Pt with a significant PMH of T2DM (not managed well), currently on insulin, and Alzheimer's, hyperglycemic, history of UTIs, ongoing pressure ulcer issues.    PT Comments    Patient continues to require max/total A and +2 for some safe transitional movements as pt has several wounds on body. It may be beneficial to have palliative care consult to aid family in pt's plan of care. PT will continue to follow acutely.   Follow Up Recommendations  Home health PT;Supervision/Assistance - 24 hour;Supervision for mobility/OOB     Equipment Recommendations  None recommended by PT    Recommendations for Other Services Other (comment) (possible palliative care consult)     Precautions / Restrictions Precautions Precautions: Fall Restrictions Weight Bearing Restrictions: Yes RLE Weight Bearing: Weight bearing as tolerated Other Position/Activity Restrictions: positional rotations due to pressure ulcers    Mobility  Bed Mobility Overal bed mobility: Needs Assistance Bed Mobility: Supine to Sit     Supine to sit: Max assist;+2 for physical assistance;HOB elevated     General bed mobility comments: assist with all aspects of bed mobility with use of bed pad; again pt began to assist with some movements when initiated first by therapist  Transfers Overall transfer level: Needs assistance Equipment used: None Transfers: Stand Pivot Transfers;Sit to/from Stand Sit to Stand: Max assist Stand pivot transfers: Total assist       General transfer comment: total A to pivot to recliner from EOB; pt held onto bed rails when transfer began and unable to follow commands for safe hand placement and easier transition likely due to  fear of falling; sit to stand from recliner with max A and to position cushion under pt    Ambulation/Gait                 Stairs            Wheelchair Mobility    Modified Rankin (Stroke Patients Only)       Balance Overall balance assessment: Needs assistance Sitting-balance support: Bilateral upper extremity supported;Feet supported Sitting balance-Leahy Scale: Fair                              Cognition Arousal/Alertness: Awake/alert Behavior During Therapy: Flat affect Overall Cognitive Status: History of cognitive impairments - at baseline                 General Comments: Pt has end stage dementia/Alzheimers. Pt responded mostly with yes/no answers.     Exercises      General Comments General comments (skin integrity, edema, etc.): pt soiled upon arrival and therapist aided in cleanign pt; pt has sore near vagina and RN aware; pt's bandage over incision site also coming off and pt picking/scratching at incision during session; tape applied to edge; RN aware of need for dressing change      Pertinent Vitals/Pain Pain Assessment: Faces Faces Pain Scale: Hurts little more Pain Location: R heel Pain Descriptors / Indicators: Grimacing;Sore Pain Intervention(s): Monitored during session;Limited activity within patient's tolerance;Repositioned    Home Living                      Prior Function  PT Goals (current goals can now be found in the care plan section) Acute Rehab PT Goals Patient Stated Goal: none stated Progress towards PT goals: Progressing toward goals    Frequency    Min 5X/week      PT Plan Current plan remains appropriate    Co-evaluation PT/OT/SLP Co-Evaluation/Treatment: Yes Reason for Co-Treatment: For patient/therapist safety PT goals addressed during session: Mobility/safety with mobility       End of Session Equipment Utilized During Treatment: Gait belt Activity Tolerance:  Patient tolerated treatment well Patient left: with call bell/phone within reach;with family/visitor present;in chair;with chair alarm set     Time: [id number] PT Time Calculation (min) (ACUTE ONLY): 39 min  Charges:  $Therapeutic Activity: 23-37 mins                    G Codes:      Derek MoundKellyn R Ross Bender Bryker Fletchall, PTA Pager: [phone]   08/10/2016, 5:11 PM

## 2016-08-10 NOTE — Progress Notes (Signed)
Subjective: Pulled wound vac off this morning, mitten restraints again placed, ABD bandage in place over right hip surgical site with no drainage. She is awake and answering questions this morning, at baseline. No complaints.   Husband at bedside reports their daughter is currently admitted here for a heart attack and is concerned because she is Ms. Danis's primary caretaker. He reports his other daughter is flying in from [place] and should be here tomorrow. May consider temporary SNF placement but prefers to wait until he can talk to his daughters about it.   Objective: Vital signs in last 24 hours: Vitals:   08/08/16 1500 08/09/16 0446 08/09/16 2250 08/10/16 0432  BP: 132/64 133/79 (!) 172/79 134/61  Pulse: 72 60 61 64  Resp: 16 16  16   Temp: 98.4 F (36.9 C) 97.3 F (36.3 C) 97.7 F (36.5 C) 98.6 F (37 C)  TempSrc: Axillary Axillary Axillary Oral  SpO2: 96% 99% 100% 99%  Weight:      Height:        Intake/Output Summary (Last 24 hours) at 08/10/16 0747 Last data filed at 08/09/16 1700  Gross per 24 hour  Intake              120 ml  Output              500 ml  Net             -380 ml    Physical Exam General appearance: Thin and malnurished-appearing elderly woman sitting in bedside chair, pleasant and conversational today Cardiovascular: Regular rate and rhythm, no murmurs, rubs, gallops Respiratory/Chest: Lungs clear to ausculation, breathing comfortably Abdomen: Bowel sounds present, soft, non-tender, non-distended Ext: Drain in place over the right hip, minimal drainage, no erythema or tenderness  GU: Labia s/p Penrose drain removal, no visible erythema, nontender  Labs / Imaging / Procedures: CBC Latest Ref Rng & Units 08/07/2016 08/06/2016 08/05/2016  WBC 4.0 - 10.5 K/uL 8.2 9.0 9.8  Hemoglobin 12.0 - 15.0 g/dL 11.8(L) 11.4(L) 12.4  Hematocrit 36.0 - 46.0 % 35.3(L) 33.8(L) 37.4  Platelets 150 - 400 K/uL 210 194 202   BMP Latest Ref Rng & Units 08/08/2016 08/07/2016  08/06/2016  Glucose 65 - 99 mg/dL 409(W184(H) 119(J180(H) 478(G348(H)  BUN 6 - 20 mg/dL 8 8 9   Creatinine 0.44 - 1.00 mg/dL 9.560.71 2.130.72 0.860.75  Sodium 135 - 145 mmol/L 139 139 134(L)  Potassium 3.5 - 5.1 mmol/L 3.4(L) 3.5 3.8  Chloride 101 - 111 mmol/L 100(L) 103 101  CO2 22 - 32 mmol/L 30 28 26   Calcium 8.9 - 10.3 mg/dL 9.3 9.0 8.9   No results found.  Assessment/Plan:  Soft tissue infection overlying prosthetic joint s/p I&D: Pt is POD2. She is afebrile and no leukocytosis. The intraop cultures were mislabeled and orthopedics have already spoken to micro lab. Synovial right hip culture shows moderate WBC- gram positive cocci in clusters. 2850 WBC cell count with 82% neutrophils. Blood cultures from 1/10 NGTD . Underwent debridement of skin and subcutaneous tissue of right hip with irrigation with ortho surgery on 1/13. Synovial fluid showing no growth. Superficial wound cultures growing Klebsiella and MRSA sensitive to cipro and cefazolin. Empiric vanc 1/11 to 1/13. - Cipro and Doxycycline started 1/14, continue until 1/27 per ID recs - Orthopedics following and appreciate recs - walker, to convert Prevena to portable, f/up with [facility] - Tramadol 50 mg PRN for pain  Labial abscess: While doing the right hip I&D, a labial  abscess was noted which was drained by general surgery, and a penrose drain si currently present -gen surg following, appreciate recs, penrose removed by gen surg today - continue daily washing with mild soapy water  T2DM: Patient on home regimen of Novolog 70/30 18U BID as this is easier regimen for her to allow decreased sticks; her last A1C in Nov 2017 was 10.5. On admission glucose was elevated to 400's which could be exacerbated by wound. infection. Currently CBGs in 300s -increased novolog 70/30 to 28 units qAM and 22 units qPM -SSI-S  PT/OT rec 24-hour assist, HH PT/OT - family not interested in SNF but primary caretaker / daughter now admitted with NSTEMI going to cath lab  today  Dispo: Anticipated discharge in approximately 1-2 day(s).    LOS: 7 days   Althia Forts, MD 08/10/2016, 7:47 AM

## 2016-08-10 NOTE — Care Management Note (Signed)
Case Management Note  Patient Details  Name: Sharon Butler MRN: [id number] Date of Birth: 1928/12/31  Subjective/Objective:  83 yr old female admitted with infected right hip, s/p I &D with wound vac application.                Action/Plan: Case manager spoke with patient's husband concerning discharge needs. He does not want patient in SNF, wants to take her home. Patient's husband gave permission for CM to setup Home Health, referral was called to Sharon Butler, [facility] Liaison.    Expected Discharge Date:    08/11/16              Expected Discharge Plan:  Home w Home Health Services  In-House Referral:  NA  Discharge planning Services  CM Consult  Post Acute Care Choice:  Home Health Choice offered to:  Spouse  DME Arranged:    DME Agency:     HH Arranged:  RN, PT, OT, Nurse's Aide HH Agency:  [facility]  Status of Service:  Completed, signed off  If discussed at Long Length of Stay Meetings, dates discussed:    Additional Comments:  Sharon Butler, Sharon Venard [name], RN 08/10/2016, 1:14 PM

## 2016-08-10 NOTE — Progress Notes (Signed)
Subjective:  Patient reports pain as mild.  No c/o. Patient pulled wound VAC off last night.  Objective:   VITALS:   Vitals:   08/08/16 1500 08/09/16 0446 08/09/16 2250 08/10/16 0432  BP: 132/64 133/79 (!) 172/79 134/61  Pulse: 72 60 61 64  Resp: 16 16  16   Temp: 98.4 F (36.9 C) 97.3 F (36.3 C) 97.7 F (36.5 C) 98.6 F (37 C)  TempSrc: Axillary Axillary Axillary Oral  SpO2: 96% 99% 100% 99%  Weight:      Height:        NAD ABD soft Sensation intact distally Intact pulses distally Dorsiflexion/Plantar flexion intact Incision: dressing C/D/I Compartment soft Dressing changed: incis much improved.   Lab Results  Component Value Date   WBC 8.2 08/07/2016   HGB 11.8 (L) 08/07/2016   HCT 35.3 (L) 08/07/2016   MCV 89.1 08/07/2016   PLT 210 08/07/2016   BMET    Component Value Date/Time   NA 139 08/08/2016 0424   K 3.4 (L) 08/08/2016 0424   CL 100 (L) 08/08/2016 0424   CO2 30 08/08/2016 0424   GLUCOSE 184 (H) 08/08/2016 0424   BUN 8 08/08/2016 0424   CREATININE 0.71 08/08/2016 0424   CALCIUM 9.3 08/08/2016 0424   GFRNONAA >60 08/08/2016 0424   GFRAA >60 08/08/2016 0424      Recent Results (from the past 240 hour(s))  Blood culture (routine x 2)     Status: None   Collection Time: 08/03/16  6:27 PM  Result Value Ref Range Status   Specimen Description BLOOD RIGHT ANTECUBITAL  Final   Special Requests BOTTLES DRAWN AEROBIC AND ANAEROBIC 5CC  Final   Culture NO GROWTH 5 DAYS  Final   Report Status 08/08/2016 FINAL  Final  Blood culture (routine x 2)     Status: None   Collection Time: 08/04/16 12:10 AM  Result Value Ref Range Status   Specimen Description BLOOD LEFT ANTECUBITAL  Final   Special Requests IN PEDIATRIC BOTTLE 1CC  Final   Culture NO GROWTH 5 DAYS  Final   Report Status 08/09/2016 FINAL  Final  Body fluid culture     Status: None   Collection Time: 08/04/16 10:41 AM  Result Value Ref Range Status   Specimen Description FLUID RIGHT  HIP  Final   Special Requests NONE  Final   Gram Stain   Final    RARE WBC PRESENT, PREDOMINANTLY PMN NO ORGANISMS SEEN    Culture NO GROWTH 3 DAYS  Final   Report Status 08/07/2016 FINAL  Final  Surgical pcr screen     Status: Abnormal   Collection Time: 08/05/16  1:22 AM  Result Value Ref Range Status   MRSA, PCR POSITIVE (A) NEGATIVE Final    Comment: RESULT CALLED TO, READ BACK BY AND VERIFIED WITH: L LEWIS,RN @0426  08/05/16 MKELLY,MLT    Staphylococcus aureus POSITIVE (A) NEGATIVE Final    Comment:        The Xpert SA Assay (FDA approved for NASAL specimens in patients over 14 years of age), is one component of a comprehensive surveillance program.  Test performance has been validated by [facility] for patients greater than or equal to 52 year old. It is not intended to diagnose infection nor to guide or monitor treatment.   Aerobic/Anaerobic Culture (surgical/deep wound)     Status: None (Preliminary result)   Collection Time: 08/05/16  3:23 PM  Result Value Ref Range Status   Specimen Description  SYNOVIAL RIGHT HIP  Final   Special Requests PATIENT ON FOLLOWING VANC  Final   Gram Stain   Final    FEW WBC PRESENT,BOTH PMN AND MONONUCLEAR NO ORGANISMS SEEN    Culture   Final    NO GROWTH 2 DAYS NO ANAEROBES ISOLATED; CULTURE IN PROGRESS FOR 5 DAYS   Report Status PENDING  Incomplete  Aerobic/Anaerobic Culture (surgical/deep wound)     Status: None   Collection Time: 08/05/16  3:23 PM  Result Value Ref Range Status   Specimen Description WOUND RIGHT HIP  Final   Special Requests   Final    PER MD SUPERFICIAL FLUID ON SWAB PATIENT ON FOLLOWING VANC   Gram Stain   Final    MODERATE WBC PRESENT, PREDOMINANTLY PMN MODERATE GRAM POSITIVE COCCI IN CLUSTERS    Culture   Final    MODERATE METHICILLIN RESISTANT STAPHYLOCOCCUS AUREUS FEW KLEBSIELLA PNEUMONIAE NO ANAEROBES ISOLATED    Report Status 08/09/2016 FINAL  Final   Organism ID, Bacteria KLEBSIELLA  PNEUMONIAE  Final   Organism ID, Bacteria METHICILLIN RESISTANT STAPHYLOCOCCUS AUREUS  Final      Susceptibility   Klebsiella pneumoniae - MIC*    AMPICILLIN 16 RESISTANT Resistant     CEFAZOLIN <=4 SENSITIVE Sensitive     CEFEPIME <=1 SENSITIVE Sensitive     CEFTAZIDIME <=1 SENSITIVE Sensitive     CEFTRIAXONE <=1 SENSITIVE Sensitive     CIPROFLOXACIN 1 SENSITIVE Sensitive     GENTAMICIN <=1 SENSITIVE Sensitive     IMIPENEM <=0.25 SENSITIVE Sensitive     TRIMETH/SULFA >=320 RESISTANT Resistant     AMPICILLIN/SULBACTAM 4 SENSITIVE Sensitive     PIP/TAZO <=4 SENSITIVE Sensitive     Extended ESBL NEGATIVE Sensitive     * FEW KLEBSIELLA PNEUMONIAE   Methicillin resistant staphylococcus aureus - MIC*    CIPROFLOXACIN <=0.5 SENSITIVE Sensitive     ERYTHROMYCIN >=8 RESISTANT Resistant     GENTAMICIN <=0.5 SENSITIVE Sensitive     OXACILLIN >=4 RESISTANT Resistant     TETRACYCLINE <=1 SENSITIVE Sensitive     VANCOMYCIN <=0.5 SENSITIVE Sensitive     TRIMETH/SULFA <=10 SENSITIVE Sensitive     CLINDAMYCIN <=0.25 SENSITIVE Sensitive     RIFAMPIN <=0.5 SENSITIVE Sensitive     Inducible Clindamycin NEGATIVE Sensitive     * MODERATE METHICILLIN RESISTANT STAPHYLOCOCCUS AUREUS     Assessment/Plan: 5 Days Post-Op   Active Problems:   Poorly controlled type 2 diabetes mellitus (HCC)   Infected surgical wound   Infection of prosthetic hip joint (HCC)   Pressure injury of skin   Malnutrition of moderate degree   Surgical site infection   Difficulty in walking, not elsewhere classified   Labial abscess   WBAT with walker Superficial SSI (MRSA and Klebsiella): 2 weeks of cipro and doxy per ID Deep cultures NGTD Dispo: daily dry dressing change with 4x4s and ABDs, f/u with me in 7 days   Sharon Butler, Sharon ReamsBrian Butler 08/10/2016, 9:30 AM   Samson FredericBrian Ronell Duffus, MD Cell [phone]

## 2016-08-10 NOTE — Progress Notes (Signed)
Occupational Therapy Treatment Patient Details Name: Sharon BeringRamona Embry MRN: [id number] DOB: 1928/09/27 Today's Date: 08/10/2016    History of present illness 83 yo F who presents post-op IRRIGATION AND DEBRIDEMENT HIP (Right) IRRIGATION AND DEBRIDEMENT LABIAL ABSCESS (Right). Pt with a significant PMH of T2DM (not managed well), currently on insulin, and Alzheimer's, hyperglycemic, history of UTIs, ongoing pressure ulcer issues.   OT comments  Pt making progress towards OT goals this session, as Pt was able to get to recliner and perform some grooming tasks with set up and  Verbal cues. Pt soiled upon arrival and therapist aided in cleaning pt; pt has sore near vagina and RN aware; pt's bandage over incision site also coming off and pt picking/scratching at incision during session; tape applied to edge; RN aware of need for dressing change. Pt's husband educated in energy conservation strategies this session to decrease burden of care. Palliative care consult recommended. Next session to work on [employer].  Follow Up Recommendations  Supervision/Assistance - 24 hour;Other (comment);SNF (HHOT if spouse continues to deny SNF)    Equipment Recommendations  None recommended by OT    Recommendations for Other Services Other (comment) (Palliative Care Consult)    Precautions / Restrictions Precautions Precautions: Fall Restrictions Weight Bearing Restrictions: Yes RLE Weight Bearing: Weight bearing as tolerated Other Position/Activity Restrictions: positional rotations due to pressure ulcers       Mobility Bed Mobility Overal bed mobility: Needs Assistance Bed Mobility: Supine to Sit     Supine to sit: Max assist;+2 for physical assistance;HOB elevated     General bed mobility comments: assist with all aspects of bed mobility with use of bed pad; again pt began to assist with some movements when initiated first by therapist  Transfers Overall transfer level: Needs assistance Equipment  used: None Transfers: Stand Pivot Transfers;Sit to/from Stand Sit to Stand: Max assist Stand pivot transfers: Total assist       General transfer comment: total A to pivot to recliner from EOB; pt held onto bed rails when transfer began and unable to follow commands for safe hand placement and easier transition likely due to fear of falling; sit to stand from recliner with max A and to position cushion under pt      Balance Overall balance assessment: Needs assistance Sitting-balance support: Bilateral upper extremity supported;Feet supported Sitting balance-Leahy Scale: Fair                             ADL Overall ADL's : Needs assistance/impaired     Grooming: Set up;Sitting;Wash/dry face                                 General ADL Comments: Pt totally dependent in all ADL; Pt wore adult diapers at baseline. Pt continues to wiggle off mits, and would benefit from a fine motor tasks like braiding or strings to interact with      Vision                     Perception     Praxis      Cognition   Behavior During Therapy: Flat affect Overall Cognitive Status: History of cognitive impairments - at baseline                  General Comments: Pt has end stage dementia/Alzheimers. Pt responds when staff can speak a  little bit of spanish, and does well when she is busy    Extremity/Trunk Assessment               Exercises     Shoulder Instructions       General Comments      Pertinent Vitals/ Pain       Pain Assessment: Faces Faces Pain Scale: Hurts little more Pain Location: R heel Pain Descriptors / Indicators: Grimacing;Sore Pain Intervention(s): Monitored during session;Limited activity within patient's tolerance;Repositioned  Home Living                                          Prior Functioning/Environment              Frequency  Min 2X/week        Progress Toward Goals  OT  Goals(current goals can now be found in the care plan section)  Progress towards OT goals: Progressing toward goals  Acute Rehab OT Goals Patient Stated Goal: none stated  Plan Discharge plan remains appropriate;Other (comment) (Pt family still denies SNF)    Co-evaluation    PT/OT/SLP Co-Evaluation/Treatment: Yes Reason for Co-Treatment: For patient/therapist safety;To address functional/ADL transfers PT goals addressed during session: Mobility/safety with mobility OT goals addressed during session: ADL's and self-care      End of Session Equipment Utilized During Treatment: Gait belt   Activity Tolerance Patient tolerated treatment well   Patient Left in bed;with family/visitor present;with restraints reapplied;with call bell/phone within reach   Nurse Communication Mobility status;Other (comment);Precautions (Sores on anterior vagina and status of itching at incision)        Time: 1610-9604 OT Time Calculation (min): 40 min  Charges: OT General Charges $OT Visit: 1 Procedure OT Treatments $Self Care/Home Management : 8-22 mins  Evern Bio Arleny Kruger 08/10/2016, 6:28 PM  Sherryl Manges OTR/L [phone]

## 2016-08-10 NOTE — Progress Notes (Signed)
Spoke to oncall ortho MD about patient pulling her wound vac off, was assisted to leave supplies at bedside and keep surgical site covered with gauze until MD arrives. Pt is stable. Will continue to assess pt

## 2016-08-10 NOTE — Progress Notes (Signed)
Patient pulled wound vac off. Wound assessed sutures are intact with serosanguinous drainage. Surgical incision cleansed and covered with gauze and ABD. New wound vac equipment ordered. Paged MD. Will continue to assess

## 2016-08-10 NOTE — Progress Notes (Signed)
Inpatient Diabetes Program Recommendations  AACE/ADA: New Consensus Statement on Inpatient Glycemic Control (2015)  Target Ranges:  Prepandial:   less than 140 mg/dL      Peak postprandial:   less than 180 mg/dL (1-2 hours)      Critically ill patients:  140 - 180 mg/dL   Results for Sharon Butler, [name] (MRN [id number]) as of 08/10/2016 10:33  Ref. Range 08/09/2016 04:45 08/09/2016 12:01 08/09/2016 16:42 08/09/2016 21:53 08/10/2016 04:18 08/10/2016 09:34  Glucose-Capillary Latest Ref Range: 65 - 99 mg/dL 409156 (H) 811344 (H) 914340 (H) 247 (H) 225 (H) 357 (H)   Review of Glycemic Control  Diabetes history: DM 2 Outpatient Diabetes medications: 70/30  18 units BID Current orders for Inpatient glycemic control: 70/30 22 units BID, Novolog Sensitive Q4 hours  A1c 10.5% on 06/15/16 uncontrolled at home  Inpatient Diabetes Program Recommendations:   Due to elevations during the day, please consider increasing the morning dose of 70/30 only to 28 units and keep PM dose of 22 units the same.   Thanks,  Christena DeemShannon Reiley Keisler RN, MSN, [facility] Inpatient Diabetes Coordinator Team Pager [phone] (8a-5p)

## 2016-08-10 NOTE — Progress Notes (Signed)
  Date: 08/10/2016  Patient name: Sharon Butler  Medical record number: [id number]  Date of birth: 07-22-1929   I have seen and evaluated this patient and I have discussed the plan of care with the house staff. Please see Dr. Henriette CombsJohnson's note for complete details. I concur with his findings.    Inez CatalinaEmily B Mullen, MD 08/10/2016, 1:58 PM

## 2016-08-11 DIAGNOSIS — F0391 Unspecified dementia with behavioral disturbance: Secondary | ICD-10-CM

## 2016-08-11 LAB — GLUCOSE, CAPILLARY
GLUCOSE-CAPILLARY: 250 mg/dL — AB (ref 65–99)
GLUCOSE-CAPILLARY: 313 mg/dL — AB (ref 65–99)
GLUCOSE-CAPILLARY: 283 mg/dL — AB (ref 65–99)
Glucose-Capillary: 288 mg/dL — ABNORMAL HIGH (ref 65–99)
Glucose-Capillary: 273 mg/dL — ABNORMAL HIGH (ref 65–99)
Glucose-Capillary: 243 mg/dL — ABNORMAL HIGH (ref 65–99)

## 2016-08-11 LAB — BASIC METABOLIC PANEL
ANION GAP: 7 (ref 5–15)
BUN: 16 mg/dL (ref 6–20)
CHLORIDE: 103 mmol/L (ref 101–111)
CO2: 26 mmol/L (ref 22–32)
Calcium: 9.5 mg/dL (ref 8.9–10.3)
Creatinine, Ser: 0.69 mg/dL (ref 0.44–1.00)
GFR calc Af Amer: >60 mL/min (ref >60)
GLUCOSE: 279 mg/dL — AB (ref 65–99)
POTASSIUM: 3.9 mmol/L (ref 3.5–5.1)
Sodium: 136 mmol/L (ref 135–145)

## 2016-08-11 MED ORDER — SENNOSIDES-DOCUSATE SODIUM 8.6-50 MG PO TABS: 1.0000 | ORAL_TABLET | Freq: Every day | ORAL | 1 refills | Status: DC

## 2016-08-11 MED ORDER — GERHARDT'S BUTT CREAM: 1.0000 "application " | TOPICAL_CREAM | Freq: Two times a day (BID) | CUTANEOUS | 0 refills | Status: DC

## 2016-08-11 MED ORDER — ATORVASTATIN CALCIUM 10 MG PO TABS: 5.0000 mg | ORAL_TABLET | Freq: Every day | ORAL | 0 refills | Status: DC

## 2016-08-11 MED ORDER — POLYETHYLENE GLYCOL 3350 17 G PO PACK
17.0000 g | PACK | Freq: Every day | ORAL | Status: DC
  Administered 2016-08-11 – 2016-08-12 (×2): 17 g via ORAL
  Filled 2016-08-11 (×2): qty 1

## 2016-08-11 MED ORDER — INSULIN ASPART PROT & ASPART (70-30 MIX) 100 UNIT/ML ~~LOC~~ SUSP: SUBCUTANEOUS | 3 refills | Status: DC

## 2016-08-11 MED ORDER — DOXYCYCLINE HYCLATE 100 MG PO TABS: 100.0000 mg | ORAL_TABLET | Freq: Two times a day (BID) | ORAL | 0 refills | Status: DC

## 2016-08-11 MED ORDER — LIVING WELL WITH DIABETES BOOK
Freq: Once | Status: AC
  Administered 2016-08-11: 13:00:00
  Filled 2016-08-11: qty 1

## 2016-08-11 MED ORDER — CIPROFLOXACIN HCL 500 MG PO TABS: 500.0000 mg | ORAL_TABLET | Freq: Two times a day (BID) | ORAL | 0 refills | Status: DC

## 2016-08-11 MED ORDER — SENNOSIDES-DOCUSATE SODIUM 8.6-50 MG PO TABS
1.0000 | ORAL_TABLET | Freq: Every day | ORAL | Status: DC
  Administered 2016-08-11 – 2016-08-12 (×2): 1 via ORAL
  Filled 2016-08-11 (×2): qty 1

## 2016-08-11 NOTE — NC FL2 (Signed)
[place] MEDICAID FL2 LEVEL OF CARE SCREENING TOOL     IDENTIFICATION  Patient Name: Sharon Butler Birthdate: 1928-12-13 Sex: female Admission Date (Current Location): 08/03/2016  [facility] and [place] Number:  Producer, television/film/video and Address:  [facility] [place], [address], [place], [place] [address]      Provider Number: [id number]  Attending Physician Name and Address:  Inez Catalina, MD  Relative Name and Phone Number:       Current Level of Care: Hospital Recommended Level of Care: Skilled Nursing Facility Prior Approval Number:    Date Approved/Denied:   PASRR Number:    Discharge Plan: SNF    Current Diagnoses: Patient Active Problem List   Diagnosis Date Noted  . Difficulty in walking, not elsewhere classified   . Labial abscess   . Superficial postoperative wound infection 08/05/2016  . Pressure injury of skin 08/04/2016  . Malnutrition of moderate degree 08/04/2016  . Poorly controlled type 2 diabetes mellitus (HCC)   . Displaced fracture of right femoral neck (HCC) 07/07/2016  . Closed displaced fracture of right femoral neck (HCC) 07/05/2016  . Dementia 07/05/2016  . Decubitus ulcer of right foot, stage 2   . Decubitus ulcer of sacral region, stage 2   . Diabetes mellitus (HCC) 06/15/2016    Orientation RESPIRATION BLADDER Height & Weight     Self  Normal Incontinent Weight: 107 lb (48.5 kg) Height:  5\' 1"  (154.9 cm)  BEHAVIORAL SYMPTOMS/MOOD NEUROLOGICAL BOWEL NUTRITION STATUS      Continent  (Please see d/c summary.)  AMBULATORY STATUS COMMUNICATION OF NEEDS Skin   Total Care Verbally Surgical wounds, PU Stage and Appropriate Care (Closed incision right hip, gauze dressings. Closed incision left labia, no dressing.  Closed incision Perineum.) PU Stage 1 Dressing:  (Left foot, foam dressing, PRN dressing)   PU Stage 3 Dressing:  (Mid  Coccyx , foam dressing, PRN dressing. Proximal Coccyx, foam dressing, PRN dressing.)                  Personal Care Assistance Level of Assistance  Bathing, Feeding, Dressing Bathing Assistance: Maximum assistance Feeding assistance: Limited assistance Dressing Assistance: Maximum assistance     Functional Limitations Info  Sight, Hearing, Speech Sight Info: Adequate Hearing Info: Adequate Speech Info: Adequate    SPECIAL CARE FACTORS FREQUENCY  PT (By licensed PT), OT (By licensed OT)     PT Frequency: 5x week OT Frequency: 5x week            Contractures Contractures Info: Not present    Additional Factors Info  Code Status, Allergies, Isolation Precautions Code Status Info: Full Code Allergies Info:  Penicillins, Codeine, Other, Shellfish Allergy     Isolation Precautions Info: Contact precaution, MRSA     Current Medications (08/11/2016):  This is the current hospital active medication list Current Facility-Administered Medications  Medication Dose Route Frequency Provider Last Rate Last Dose  . acetaminophen (TYLENOL) tablet 650 mg  650 mg Oral Q6H PRN Samson Frederic, MD   650 mg at 08/08/16 0012   Or  . acetaminophen (TYLENOL) suppository 650 mg  650 mg Rectal Q6H PRN Samson Frederic, MD      . atorvastatin (LIPITOR) tablet 5 mg  5 mg Oral q1800 Deneise Lever, MD      . calcium-vitamin D (OSCAL WITH D) 500-200 MG-UNIT per tablet 2 tablet  2 tablet Oral Q breakfast John Giovanni, MD   2 tablet at 08/11/16 0933  .  ciprofloxacin (CIPRO) tablet 500 mg  500 mg Oral BID Deneise LeverParth Saraiya, MD   500 mg at 08/11/16 0934  . doxycycline (VIBRA-TABS) tablet 100 mg  100 mg Oral Q12H Randall Hissornelius N Van Dam, MD   100 mg at 08/11/16 0935  . enoxaparin (LOVENOX) injection 30 mg  30 mg Subcutaneous Q24H Samson FredericBrian Swinteck, MD   30 mg at 08/11/16 0933  . feeding supplement (ENSURE ENLIVE) (ENSURE ENLIVE) liquid 237 mL  237 mL Oral BID BM Inez CatalinaEmily B Mullen, MD   237 mL at 08/10/16 1500  . feeding supplement (GLUCERNA SHAKE) (GLUCERNA SHAKE) liquid 237 mL  237 mL Oral BID BM  John GiovanniVasundhra Rathore, MD   237 mL at 08/11/16 0939  . Gerhardt's butt cream   Topical BID Inez CatalinaEmily B Mullen, MD      . insulin aspart (novoLOG) injection 0-9 Units  0-9 Units Subcutaneous Q4H Deneise LeverParth Saraiya, MD   3 Units at 08/11/16 1247  . insulin aspart protamine- aspart (NOVOLOG MIX 70/30) injection 22 Units  22 Units Subcutaneous Q supper Althia FortsAdam Johnson, MD   22 Units at 08/10/16 1737  . insulin aspart protamine- aspart (NOVOLOG MIX 70/30) injection 28 Units  28 Units Subcutaneous Q breakfast Althia FortsAdam Johnson, MD   28 Units at 08/11/16 0940  . metoCLOPramide (REGLAN) tablet 5-10 mg  5-10 mg Oral Q8H PRN Samson FredericBrian Swinteck, MD       Or  . metoCLOPramide (REGLAN) injection 5-10 mg  5-10 mg Intravenous Q8H PRN Samson FredericBrian Swinteck, MD      . multivitamins with iron tablet 1 tablet  1 tablet Oral Daily Inez CatalinaEmily B Mullen, MD   1 tablet at 08/11/16 0935  . omega-3 acid ethyl esters (LOVAZA) capsule 1 g  1 g Oral Daily Inez CatalinaEmily B Mullen, MD   1 g at 08/10/16 0953  . ondansetron (ZOFRAN) tablet 4 mg  4 mg Oral Q6H PRN Samson FredericBrian Swinteck, MD       Or  . ondansetron (ZOFRAN) injection 4 mg  4 mg Intravenous Q6H PRN Samson FredericBrian Swinteck, MD      . polyethylene glycol (MIRALAX / GLYCOLAX) packet 17 g  17 g Oral Daily Althia FortsAdam Johnson, MD   17 g at 08/11/16 1247  . senna-docusate (Senokot-S) tablet 1 tablet  1 tablet Oral Daily Althia FortsAdam Johnson, MD   1 tablet at 08/11/16 1247  . traMADol (ULTRAM) tablet 50 mg  50 mg Oral Q6H PRN John GiovanniVasundhra Rathore, MD   50 mg at 08/08/16 2257     Discharge Medications: Please see discharge summary for a list of discharge medications.  Relevant Imaging Results:  Relevant Lab Results:   Additional Information SSN: [id number]  Ht: 5\' 1"  (1.549 m)  Wt: 107 lb (48.5 kg)     Marylyn Appenzeller A Mayton, LCSW

## 2016-08-11 NOTE — Care Management (Signed)
Case manager has spoken with Physical therapist and patient's husband concerning discharge plan. Patient's daughter, who is primary caregiver, is currently inpatient after suffering a heart attack. Patient's Husband is not able to physically assist pateint from bed to chair or manage her medicines. Mr. [name] anticipates another daughter to come assist him this weekend. Cm has spoken with Dr. Laural BenesJohnson and has contacted interpreter, Sharen HeckGracella, to discuss the need for patient to go to SNF as recommended by physical therapy. Case manager has also discussed situation with unit Social worker, Karn PicklerBridget M.

## 2016-08-11 NOTE — Care Management (Addendum)
Case manager and social worker spoke with FlorinGracella, our interpreter, after she spoke with Mr. Sharon Butler. He is adamant that his wife will not go to going to a facility, he said he knows that she will die soon and he plans to care for her at home until then. The plan is for family to assist him if he needs it. CM will provide Dr. Laural BenesJohnson with this update. Husband informed that [facility] will transport her home on tomorrow. Vance PeperSusan Nicci Vaughan, RN BSN Case Manager [phone].  Mr.Agosta states that their granddaughter has been helping him with doing his wife's  fingersticks and administering her insulin.

## 2016-08-11 NOTE — Progress Notes (Signed)
Inpatient Diabetes Program Recommendations  AACE/ADA: New Consensus Statement on Inpatient Glycemic Control (2015)  Target Ranges:  Prepandial:   less than 140 mg/dL      Peak postprandial:   less than 180 mg/dL (1-2 hours)      Critically ill patients:  140 - 180 mg/dL   Lab Results  Component Value Date   GLUCAP 243 (H) 08/11/2016   HGBA1C 10.5 (H) 06/15/2016    Review of Glycemic Control  Inpatient Diabetes Program Recommendations:   Spoke with pt about A1C results 10.4 and explained what an A1C is, basic pathophysiology of DM Type 2, basic home care, basic diabetes diet nutrition principles, importance of checking CBGs and maintaining good CBG control to prevent long-term and short-term complications. Reviewed signs and symptoms of hyperglycemia and hypoglycemia and how to treat hypoglycemia at home. Also reviewed blood sugar goals at home.  RNs to provide ongoing basic DM education at bedside with this patient. Have ordered educational booklet.  Thank you, Billy FischerJudy E. Nica Friske, RN, MSN, CDE Inpatient Glycemic Control Team Team Pager [phone] (8am-5pm) 08/11/2016 12:45 PM

## 2016-08-11 NOTE — Progress Notes (Signed)
Patient ID: Sharon Butler, female   DOB: 01-09-1929, 83 y.o.   MRN: [id number]  [facility]  6 Days Post-Op  Subjective: Patient sitting up in chair eating breakfast. No complaints.  Objective: Vital signs in last 24 hours: Temp:  [98.3 F (36.8 C)] 98.3 F (36.8 C) (01/17 2221) Pulse Rate:  [76] 76 (01/17 2221) Resp:  [18] 18 (01/17 2221) BP: (131)/(69) 131/69 (01/17 2221) SpO2:  [100 %] 100 % (01/17 2221) Last BM Date:  (PTA)  Intake/Output from previous day: 01/17 0701 - 01/18 0700 In: 390 [P.O.:390] Out: -  Intake/Output this shift: No intake/output data recorded.  PE: Gen:  alert, NAD, confused Pulm:  Normal effort GU: left labial wound c/d/i with no surrounding erythema or purulent drainage  Lab Results:  No results for input(s): WBC, HGB, HCT, PLT in the last 72 hours. BMET  Recent Labs  08/11/16 0604  NA 136  K 3.9  CL 103  CO2 26  GLUCOSE 279*  BUN 16  CREATININE 0.69  CALCIUM 9.5   PT/INR No results for input(s): LABPROT, INR in the last 72 hours. CMP     Component Value Date/Time   NA 136 08/11/2016 0604   K 3.9 08/11/2016 0604   CL 103 08/11/2016 0604   CO2 26 08/11/2016 0604   GLUCOSE 279 (H) 08/11/2016 0604   BUN 16 08/11/2016 0604   CREATININE 0.69 08/11/2016 0604   CALCIUM 9.5 08/11/2016 0604   PROT 6.7 08/03/2016 1537   ALBUMIN 2.8 (L) 08/03/2016 1537   AST 16 08/03/2016 1537   ALT 16 08/03/2016 1537   ALKPHOS 122 08/03/2016 1537   BILITOT 0.4 08/03/2016 1537   GFRNONAA >60 08/11/2016 0604   GFRAA >60 08/11/2016 0604   Lipase  No results found for: LIPASE     Studies/Results: No results found.  Anti-infectives: Anti-infectives    Start     Dose/Rate Route Frequency Ordered Stop   08/07/16 2000  ciprofloxacin (CIPRO) tablet 500 mg     500 mg Oral 2 times daily 08/07/16 1218     08/07/16 1600  cephALEXin (KEFLEX) capsule 500 mg  Status:  Discontinued     500 mg Oral Every 6 hours 08/07/16 1154  08/07/16 1212   08/07/16 1330  doxycycline (VIBRA-TABS) tablet 100 mg     100 mg Oral Every 12 hours 08/07/16 1320     08/07/16 1200  ciprofloxacin (CIPRO) tablet 500 mg  Status:  Discontinued     500 mg Oral 2 times daily 08/07/16 1100 08/07/16 1154   08/07/16 1000  sulfamethoxazole-trimethoprim (BACTRIM DS,SEPTRA DS) 800-160 MG per tablet 1 tablet  Status:  Discontinued     1 tablet Oral Every 12 hours 08/07/16 0734 08/07/16 1320   08/05/16 1215  vancomycin (VANCOCIN) IVPB 1000 mg/200 mL premix  Status:  Discontinued     1,000 mg 200 mL/hr over 60 Minutes Intravenous On call to O.R. 08/05/16 0723 08/05/16 0725   08/04/16 1400  vancomycin (VANCOCIN) IVPB 750 mg/150 ml premix  Status:  Discontinued     750 mg 150 mL/hr over 60 Minutes Intravenous Every 24 hours 08/04/16 1254 08/07/16 1100   08/04/16 1315  levofloxacin (LEVAQUIN) IVPB 500 mg     500 mg 100 mL/hr over 60 Minutes Intravenous  Once 08/04/16 1301 08/04/16 1512       Assessment/Plan S/p IRRIGATION AND DEBRIDEMENT LABIAL ABSCESS - 08/05/2016 - [place]i - POD 6             -  wound is clean and healing well. Continue to clean daily with mild soap and water.              antibiotics per ID  IRRIGATION AND DEBRIDEMENT RightHIP - 08/05/2016 - Swinteck  - per ortho  Plan - continue daily wound care by washing with warm/mild soapy water. Antibiotics per ID. Follow-up in D[facility] clinic in about 2 weeks for wound check. General surgery will sign off. Please call with concerns.   LOS: 8 days    [name] , [facility] Surgery 08/11/2016, 2:38 PM Pager: [phone] Consults: [phone] Mon-Fri 7:00 am-4:30 pm Sat-Sun 7:00 am-11:30 am
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
## 2016-08-11 NOTE — Progress Notes (Signed)
Physical Therapy Treatment Patient Details Name: Sharon [name] MRN: [id number] DOB: 12-Nov-1928 Today's Date: 08/11/2016    History of Present Illness 83 yo F who presents post-op IRRIGATION AND DEBRIDEMENT HIP (Right) IRRIGATION AND DEBRIDEMENT LABIAL ABSCESS (Right). Pt with a significant PMH of T2DM (not managed well), currently on insulin, and Alzheimer's, hyperglycemic, history of UTIs, ongoing pressure ulcer issues.    PT Comments    This session was focused on caregiver training as pt's husband is addiment about taking pt home. Pt's husband was unable to safely stand/transfer pt and daughter that they live with is in the hospital due to heart attack. Husband reported they will have assistance on Saturday for certain. They would benefit from hoyer lift for pt/caregiver safety however they likely do not have room for one in their home. PT will continue to follow acutely.    Follow Up Recommendations  Home health PT;Supervision/Assistance - 24 hour;Supervision for mobility/OOB     Equipment Recommendations  None recommended by PT    Recommendations for Other Services Other (comment) (possible palliative care consult)     Precautions / Restrictions Precautions Precautions: Fall Restrictions Weight Bearing Restrictions: Yes RLE Weight Bearing: Weight bearing as tolerated Other Position/Activity Restrictions: positional rotations due to pressure ulcers    Mobility  Bed Mobility Overal bed mobility: Needs Assistance Bed Mobility: Supine to Sit     Supine to sit: +2 for physical assistance;HOB elevated;Total assist     General bed mobility comments: assist for all aspects of bed mobility with use of bed pad  Transfers Overall transfer level: Needs assistance Equipment used: None Transfers: Stand Pivot Transfers;Sit to/from Stand Sit to Stand: Max assist;Total assist Stand pivot transfers: Total assist       General transfer comment: attempted sit to stands X3 with  husband assisting with therapist demonstration and cues; husband unable to safely stand pt; total A for stand pivot to recliner from EOB  Ambulation/Gait                 Stairs            Wheelchair Mobility    Modified Rankin (Stroke Patients Only)       Balance Overall balance assessment: Needs assistance Sitting-balance support: Bilateral upper extremity supported;Feet supported Sitting balance-Leahy Scale: Fair       Standing balance-Leahy Scale: Zero                      Cognition Arousal/Alertness: Awake/alert Behavior During Therapy: Flat affect Overall Cognitive Status: History of cognitive impairments - at baseline                 General Comments: Pt has end stage dementia/Alzheimers. Pt responded mostly with yes/no answers.     Exercises      General Comments General comments (skin integrity, edema, etc.): pt's husband educated extensively on transfer training and turning/positioning schedule to prevent further wounds      Pertinent Vitals/Pain Pain Assessment: Faces Faces Pain Scale: No hurt Pain Intervention(s): Monitored during session    Home Living                      Prior Function            PT Goals (current goals can now be found in the care plan section) Acute Rehab PT Goals Patient Stated Goal: none stated Progress towards PT goals: Not progressing toward goals - comment    Frequency  Min 5X/week      PT Plan Current plan remains appropriate    Co-evaluation             End of Session Equipment Utilized During Treatment: Gait belt Activity Tolerance: Patient tolerated treatment well Patient left: with call bell/phone within reach;with family/visitor present;in chair;with chair alarm set     Time: 1344-1430 PT Time Calculation (min) (ACUTE ONLY): 46 min  Charges:  $Therapeutic Activity: 38-52 mins                    G Codes:      Derek Mound, PTA Pager:  [phone]   08/11/2016, 2:47 PM

## 2016-08-11 NOTE — Progress Notes (Signed)
  Date: 08/11/2016  Patient name: Sharon [name]  Medical record number: [id number]  Date of birth: 1929-01-11   I have seen and evaluated this patient and I have discussed the plan of care with the house staff. Please see Dr. Henriette CombsJohnson's note for complete details. I concur with his findings.   Plan for discharge home today.   Inez CatalinaEmily B Mackynzie Woolford, MD 08/11/2016, 11:13 AM

## 2016-08-11 NOTE — Progress Notes (Signed)
Subjective: Sharon Butler is sleeping well this morning, wakes briefly to say hello. Complaining of some mild abdominal pain. Husband at bedside very interested in bringing Sharon Butler home today, states grandchildren will be available to assist in her medical care at home.   Objective: Vital signs in last 24 hours: Vitals:   08/09/16 2250 08/10/16 0432 08/10/16 1300 08/10/16 2221  BP: (!) 172/79 134/61 137/67 131/69  Pulse: 61 64 70 76  Resp:  16 18 18   Temp: 97.7 F (36.5 C) 98.6 F (37 C) 98 F (36.7 C) 98.3 F (36.8 C)  TempSrc: Axillary Oral  Oral  SpO2: 100% 99% 99% 100%  Weight:      Height:        Intake/Output Summary (Last 24 hours) at 08/11/16 1003 Last data filed at 08/10/16 1800  Gross per 24 hour  Intake              240 ml  Output                0 ml  Net              240 ml    Physical Exam General appearance: Thin and malnurished-appearing elderly woman laying in bed, pleasant and conversational Cardiovascular: Regular rate and rhythm, no murmurs, rubs, gallops Respiratory/Chest: Lungs clear to ausculation, breathing comfortably Abdomen: Bowel sounds present, soft, mild tenderness to palpation, non-distended Ext: Abd bandage in place over right hip, no erythema or tenderness  GU: Labia s/p Penrose drain removal, no visible erythema, nontender  Labs / Imaging / Procedures: CBC Latest Ref Rng & Units 08/07/2016 08/06/2016 08/05/2016  WBC 4.0 - 10.5 K/uL 8.2 9.0 9.8  Hemoglobin 12.0 - 15.0 g/dL 11.8(L) 11.4(L) 12.4  Hematocrit 36.0 - 46.0 % 35.3(L) 33.8(L) 37.4  Platelets 150 - 400 K/uL 210 194 202   BMP Latest Ref Rng & Units 08/11/2016 08/08/2016 08/07/2016  Glucose 65 - 99 mg/dL 161(W279(H) 960(A184(H) 540(J180(H)  BUN 6 - 20 mg/dL 16 8 8   Creatinine 0.44 - 1.00 mg/dL 8.110.69 9.140.71 7.820.72  Sodium 135 - 145 mmol/L 136 139 139  Potassium 3.5 - 5.1 mmol/L 3.9 3.4(L) 3.5  Chloride 101 - 111 mmol/L 103 100(L) 103  CO2 22 - 32 mmol/L 26 30 28   Calcium 8.9 - 10.3 mg/dL 9.5 9.3 9.0     No results found.  Assessment/Plan:  Soft tissue infection overlying prosthetic joint s/p I&D: Pt is POD2. She is afebrile and no leukocytosis. The intraop cultures were mislabeled and orthopedics have already spoken to micro lab. Synovial right hip culture shows moderate WBC- gram positive cocci in clusters. 2850 WBC cell count with 82% neutrophils. Blood cultures from 1/10 NGTD . Underwent debridement of skin and subcutaneous tissue of right hip with irrigation with ortho surgery on 1/13. Synovial fluid showing no growth. Superficial wound cultures growing Klebsiella and MRSA sensitive to cipro and cefazolin. Empiric vanc 1/11 to 1/13. - Cipro and Doxycycline started 1/14, continue until 1/27 per ID recs - Orthopedics following and appreciate recs - daily dry dressing change with 4x4s and ABDs, f/up with Dr. Linna CapriceSwinteck by 1/24 - Tramadol 50 mg PRN for pain  Constipation - mild abdominal pain and no BMs in several days - Miralax and Senna daily  Labial abscess: While doing the right hip I&D, a labial abscess was noted which was drained by general surgery, and a penrose drain removed - continue daily washing with mild soapy water  T2DM: Patient on home  regimen of Novolog 70/30 18U BID as this is easier regimen for her to allow decreased sticks; her last A1C in Nov 2017 was 10.5. On admission glucose was elevated to 400's which could be exacerbated by wound. infection. Currently CBGs in 300s -increased novolog 70/30 to 28 units qAM and 22 units qPM -SSI-S  PT/OT rec 24-hour assist, HH PT/OT - family adamantly refusing SNF  Dispo: Anticipated discharge today.   LOS: 8 days   Sharon Forts, MD 08/11/2016, 10:03 AM
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
## 2016-08-12 LAB — GLUCOSE, CAPILLARY
GLUCOSE-CAPILLARY: 113 mg/dL — AB (ref 65–99)
GLUCOSE-CAPILLARY: 170 mg/dL — AB (ref 65–99)
GLUCOSE-CAPILLARY: 243 mg/dL — AB (ref 65–99)
Glucose-Capillary: 155 mg/dL — ABNORMAL HIGH (ref 65–99)

## 2016-08-12 LAB — CREATININE, SERUM
CREATININE: 0.91 mg/dL (ref 0.44–1.00)
GFR calc Af Amer: >60 mL/min (ref >60)
GFR, EST NON AFRICAN AMERICAN: 56 mL/min — AB (ref >60)

## 2016-08-12 MED ORDER — DOXYCYCLINE HYCLATE 100 MG PO TABS: 100.0000 mg | ORAL_TABLET | Freq: Two times a day (BID) | ORAL | 0 refills | Status: DC

## 2016-08-12 MED ORDER — SENNOSIDES-DOCUSATE SODIUM 8.6-50 MG PO TABS: 1.0000 | ORAL_TABLET | Freq: Every evening | ORAL | Status: DC | PRN

## 2016-08-12 MED ORDER — POLYETHYLENE GLYCOL 3350 17 G PO PACK: 17.0000 g | PACK | Freq: Every day | ORAL | 0 refills | Status: DC | PRN

## 2016-08-12 MED ORDER — SENNOSIDES-DOCUSATE SODIUM 8.6-50 MG PO TABS: 1.0000 | ORAL_TABLET | Freq: Every evening | ORAL | 1 refills | Status: DC | PRN

## 2016-08-12 MED ORDER — POLYETHYLENE GLYCOL 3350 17 G PO PACK: 17.0000 g | PACK | Freq: Every day | ORAL | Status: DC | PRN

## 2016-08-12 MED ORDER — CIPROFLOXACIN HCL 500 MG PO TABS: 500.0000 mg | ORAL_TABLET | Freq: Two times a day (BID) | ORAL | 0 refills | Status: DC

## 2016-08-12 NOTE — Progress Notes (Signed)
Nutrition Follow-up  DOCUMENTATION CODES:   Non-severe (moderate) malnutrition in context of chronic illness  INTERVENTION:  Plans to discharge home today.   Recommend continuation of nutritional supplements post discharge especially if po intake becomes poor.   NUTRITION DIAGNOSIS:   Malnutrition related to chronic illness as evidenced by mild depletion of muscle mass, moderate depletions of muscle mass, mild depletion of body fat, moderate depletion of body fat; ongoing  GOAL:   Patient will meet greater than or equal to 90% of their needs; met  MONITOR:   PO intake, Supplement acceptance, Labs, Weight trends, Skin, I & O's  REASON FOR ASSESSMENT:   Malnutrition Screening Tool    ASSESSMENT:   [name] is a 83yo female with PMH of Alzheimer's Dementia, T2DM, and h/o of decubitus ulcers presenting to the ED today with one day history of swelling, redness and drainage from her recent right hip replacement surgical site (07/07/16).   Meal completion has been 50-100%. Pt currently has Ensure and Glucerna Shake ordered and has been consuming most of them. Plans for discharge home today. Recommend continuation of nutritional supplements at home to aid in adequate nutrition as well as in wound healing. Labs and medications reviewed.   Diet Order:  DIET DYS 3 Room service appropriate? Yes; Fluid consistency: Thin Diet - low sodium heart healthy  Skin:  Wound (see comment) (Stg 3 coccyx, stg 1 L foot, incision on hip)  Last BM:  1/19  Height:   Ht Readings from Last 1 Encounters:  08/03/16 _0  (1.549 m)    Weight:   Wt Readings from Last 1 Encounters:  08/03/16 107 lb (48.5 kg)    Ideal Body Weight:  47.7 kg  BMI:  Body mass index is 20.22 kg/m.  Estimated Nutritional Needs:   Kcal:  1300-1600  Protein:  65-80 grams  Fluid:  >1.2 L  EDUCATION NEEDS:   Education needs addressed  Sharon Parker, MS, RD, LDN Pager # [phone] After hours/ weekend  pager # [phone]

## 2016-08-12 NOTE — Progress Notes (Signed)
  Date: 08/12/2016  Patient name: Fabio BeringRamona Mckercher  Medical record number: [id number]  Date of birth: 08/28/1928   I have seen and evaluated this patient and I have discussed the plan of care with the house staff. Please see Dr. Henriette CombsJohnson's note for complete details. I concur with his findings.  Plan for discharge today.   Inez CatalinaEmily B Mullen, MD 08/12/2016, 2:19 PM

## 2016-08-12 NOTE — Progress Notes (Signed)
Physical Therapy Treatment Patient Details Name: Sharon Butler MRN: [id number] DOB: 1929/06/27 Today's Date: 08/12/2016    History of Present Illness 83 yo F who presents post-op IRRIGATION AND DEBRIDEMENT HIP (Right) IRRIGATION AND DEBRIDEMENT LABIAL ABSCESS (Right). Pt with a significant PMH of T2DM (not managed well), currently on insulin, and Alzheimer's, hyperglycemic, history of UTIs, ongoing pressure ulcer issues.    PT Comments    This session was focused on caregiver training for positioning, bed mobility, and positioning schedule. Pt's husband was educated about pressure injuries and importance of positioning and proper hygiene to the prevention/healing of those wounds. Continue to recommend that palliative care be involved in POC although husband is not receptive.   Follow Up Recommendations  Home health PT;Supervision/Assistance - 24 hour;Supervision for mobility/OOB     Equipment Recommendations  Hospital bed;Other (comment) (air mattress)    Recommendations for Other Services Other (comment) (possible palliative care consult)     Precautions / Restrictions Precautions Precautions: Fall Restrictions Weight Bearing Restrictions: Yes RLE Weight Bearing: Weight bearing as tolerated Other Position/Activity Restrictions: positional rotations due to pressure ulcers    Mobility  Bed Mobility Overal bed mobility: Needs Assistance Bed Mobility: Rolling Rolling: Total assist         General bed mobility comments: therapist demonstrated positioning to aid with prevention/healing of pressure injuries  Transfers                    Ambulation/Gait                 Stairs            Wheelchair Mobility    Modified Rankin (Stroke Patients Only)       Balance                                    Cognition Arousal/Alertness: Awake/alert Behavior During Therapy: Flat affect Overall Cognitive Status: History of cognitive  impairments - at baseline                 General Comments: Pt has end stage dementia/Alzheimers. Pt responded mostly with yes/no answers.     Exercises      General Comments General comments (skin integrity, edema, etc.): education provided to pt's husband on pressure injuries (handout given) and positioning both in bed and in w/c and couch/chair; also discussed importance of positioning schedule and checking often for incontinence; therapist discussed need for hospital bed with air mattress overlay and husband reported he will discuss fitting bed in home with his daughter      Pertinent Vitals/Pain Pain Assessment: Faces Faces Pain Scale: Hurts even more Pain Location: pt unable to state but holding abdomen Pain Descriptors / Indicators: Grimacing;Guarding Pain Intervention(s): Limited activity within patient's tolerance;Monitored during session;Repositioned    Home Living                      Prior Function            PT Goals (current goals can now be found in the care plan section) Acute Rehab PT Goals Patient Stated Goal: none stated by pt--husband wants to take pt home Progress towards PT goals: Not progressing toward goals - comment    Frequency    Min 5X/week      PT Plan Current plan remains appropriate    Co-evaluation  End of Session   Activity Tolerance: Patient tolerated treatment well Patient left: with call bell/phone within reach;with family/visitor present;in bed     Time: [id number] PT Time Calculation (min) (ACUTE ONLY): 43 min  Charges:  $Therapeutic Activity: 38-52 mins                    G Codes:      Derek MoundKellyn R Kelvin Burpee Ritik Stavola, PTA Pager: [phone]   08/12/2016, 1:16 PM

## 2016-08-12 NOTE — Discharge Instructions (Signed)
Total Hip Replacement Total hip replacement is a surgical procedure to Surgical Site Infections FAQs What is a Surgical Site Infection (SSI)?  A surgical site infection is an infection that occurs after surgery in the part of the body where the surgery took place. Most patients who have surgery do not develop an infection. However, infections develop in about 1 to 3 out of every 100 patients who have surgery. Some of the common symptoms of a surgical site infection are:  Redness and pain around the area where you had surgery  Drainage of cloudy fluid from your surgical wound  Fever Can SSIs be treated?  Yes. Most surgical site infections can be treated with antibiotics. The antibiotic given to you depends on the bacteria (germs) causing the infection. Sometimes patients with SSIs also need another surgery to treat the infection. What are some of the things that hospitals are doing to prevent SSIs?  To prevent SSIs, doctors, nurses, and other healthcare providers:  Clean their hands and arms up to their elbows with an antiseptic agent just before the surgery.  Clean their hands with soap and water or an alcohol-based hand rub before and after caring for each patient.  May remove some of your hair immediately before your surgery using electric clippers if the hair is in the same area where the procedure will occur. They should not shave you with a razor.  Wear special hair covers, masks, gowns, and gloves during surgery to keep the surgery area clean.  Give you antibiotics before your surgery starts. In most cases, you should get antibiotics within 60 minutes before the surgery starts and the antibiotics should be stopped within 24 hours after surgery.  Clean the skin at the site of your surgery with a special soap that kills germs. What can I do to help prevent SSIs?  Before your surgery:  Tell your doctor about other medical problems you may have. Health problems such as allergies,  diabetes, and obesity could affect your surgery and your treatment.  Quit smoking. Patients who smoke get more infections. Talk to your doctor about how you can quit before your surgery.  Do not shave near where you will have surgery. Shaving with a razor can irritate your skin and make it easier to develop an infection. At the time of your surgery:  Speak up if someone tries to shave you with a razor before surgery. Ask why you need to be shaved and talk with your surgeon if you have any concerns.  Ask if you will get antibiotics before surgery. After your surgery:  Make sure that your healthcare providers clean their hands before examining you, either with soap and water or an alcohol-based hand rub.  If you do not see your providers clean their hands, please ask them to do so.  Family and friends who visit you should not touch the surgical wound or dressings.  Family and friends should clean their hands with soap and water or an alcohol-based hand rub before and after visiting you. If you do not see them clean their hands, ask them to clean their hands. What do I need to do when I go home from the hospital?  Before you go home, your doctor or nurse should explain everything you need to know about taking care of your wound. Make sure you understand how to care for your wound before you leave the hospital.  Always clean your hands before and after caring for your wound.  Before you go  home, make sure you know who to contact if you have questions or problems after you get home.  If you have any symptoms of an infection, such as redness and pain at the surgery site, drainage, or fever, call your doctor immediately. If you have additional questions, please ask your doctor or nurse.  Developed and co-sponsored by [employer] for [employer] of [place] 7633605419(SHEA); Infectious Diseases Society of America (IDSA); [facility]; Association for Professionals in Infection  Control and Epidemiology (APIC); Centers for Disease Control and Prevention (CDC); and [employer].  This information is not intended to replace advice given to you by your health care provider. Make sure you discuss any questions you have with your health care provider. Document Released: 07/16/2013 Document Revised: 12/17/2015 Document Reviewed: 11/30/2015 Elsevier Interactive Patient Education  2017 [employer] remove damaged bone in your hip joint and replace it with an artificial hip joint (prosthetic hip joint). The purpose of this surgery is to reduce pain and to improve your hip function. During a total hip replacement, one or both parts of the hip joint are replaced, depending on the type of joint damage you have. The hip is a ball-and-socket type of joint, and it has two main parts. The ball part of the joint (femoral head) is the top of the thigh bone (femur). The socket part of the joint is a large indent in the side of your pelvis (acetabulum) where the femur and pelvis meet. Tell a health care provider about:  Any allergies you have.  All medicines you are taking, including vitamins, herbs, eye drops, creams, and over-the-counter medicines.  Any problems you or family members have had with anesthetic medicines.  Any blood disorders you have.  Any surgeries you have had.  Any medical conditions you have. What are the risks? Generally, total hip replacement is a safe procedure. However, problems can occur, including:  Infection.  Dislocation (the ball of the hip-joint prosthesis comes out of contact with the socket).  Loosening of the piece (stem) that connects the prosthetic femoral head to the femur.  Fracture of the bone while inserting the prosthesis.  Formation of blood clots, which can break loose and travel to and injure your lungs (pulmonary embolus). What happens before the procedure?  Plan to have someone take you home after the procedure.  Do not  eat or drink anything after midnight on the night before the procedure or as directed by your health care provider.  Ask your health care provider about:  Changing or stopping your regular medicines. This is especially important if you are taking diabetes medicines or blood thinners.  Taking medicines such as aspirin and ibuprofen. These medicines can thin your blood. Do not take these medicines before your procedure if your health care provider asks you not to.  Ask your health care provider about how your surgical site will be marked or identified.  You may be given antibiotic medicines to help prevent infection. What happens during the procedure?  To reduce your risk of infection:  Your health care team will wash or sanitize their hands.  Your skin will be washed with soap.  An IV tube will be inserted into one of your veins. You will be given one or more of the following:  A medicine that makes you drowsy (sedative).  A medicine that makes you fall asleep (general anesthetic).  A medicine injected into your spine that numbs your body below the waist (spinal anesthetic).  An  incision will be made in your hip. Your surgeon will take out any damaged cartilage and bone.  Your surgeon will then:  Insert a prosthetic socket into the acetabulum of your pelvis. This is usually secured with screws.  Remove the femoral head and replace it with a prosthetic ball and stem secured into the top of your femur.  Place the ball into the socket and check the range of motion and stability of your new hip.  Close the incision and apply a bandage over the surgical site. What happens after the procedure?  You will stay in a recovery area until the medicines have worn off.  Your vital signs, such as your pulse and blood pressure, will be monitored.  Once you are awake and stable, you will be taken to a hospital room.  You may be directed to take actions to help prevent blood clots. These  may include:  Walking soon after surgery, with someone assisting you. Moving around after surgery helps to improve blood flow.  Taking medicines to thin your blood (anticoagulants).  Wearing compression stockings or using different types of devices.  You will receive physical therapy until you are doing well and your health care provider feels it is safe for you to go home. This information is not intended to replace advice given to you by your health care provider. Make sure you discuss any questions you have with your health care provider. Document Released: 10/17/2000 Document Revised: 03/14/2016 Document Reviewed: 09/11/2013 Elsevier Interactive Patient Education  2017 Elsevier Inc.   Keep wound VAC clean and dry. Do not remove. Complete all antibiotics as prescribed.

## 2016-08-12 NOTE — Progress Notes (Signed)
Subjective: [name] is feeling good today, no complaints. No abdominal pain and was able to have BM yesterday. Looking forward to going home today.  Her husband at bedside states that her granddaughter is visiting the hospital today and can learn how to give insulin from nursing.   Objective: Vital signs in last 24 hours: Vitals:   08/10/16 2221 08/11/16 1500 08/11/16 2045 08/12/16 0454  BP: 131/69 (!) 149/57 129/67 (!) 154/53  Pulse: 76  70 (!) 56  Resp: 18  16 16   Temp: 98.3 F (36.8 C)  99 F (37.2 C) 98.2 F (36.8 C)  TempSrc: Oral  Oral Oral  SpO2: 100%  97% 99%  Weight:      Height:        Intake/Output Summary (Last 24 hours) at 08/12/16 0749 Last data filed at 08/12/16 0526  Gross per 24 hour  Intake              340 ml  Output                0 ml  Net              340 ml    Physical Exam General appearance: Thin and malnurished-appearing elderly woman laying in bed, pleasant and conversational Cardiovascular: Regular rate and rhythm, no murmurs, rubs, gallops Respiratory/Chest: Lungs clear to ausculation, breathing comfortably Abdomen: Bowel sounds present, soft, no tenderness to palpation, non-distended Ext: Abd bandage in place over right hip, incision with no erythema, minimal tenderness  GU: Labia s/p Penrose drain removal, no visible erythema, nontender  Labs / Imaging / Procedures: CBC Latest Ref Rng & Units 08/07/2016 08/06/2016 08/05/2016  WBC 4.0 - 10.5 K/uL 8.2 9.0 9.8  Hemoglobin 12.0 - 15.0 g/dL 11.8(L) 11.4(L) 12.4  Hematocrit 36.0 - 46.0 % 35.3(L) 33.8(L) 37.4  Platelets 150 - 400 K/uL 210 194 202   BMP Latest Ref Rng & Units 08/11/2016 08/08/2016 08/07/2016  Glucose 65 - 99 mg/dL 295(A279(H) 213(Y184(H) 865(H180(H)  BUN 6 - 20 mg/dL 16 8 8   Creatinine 0.44 - 1.00 mg/dL 8.460.69 9.620.71 9.520.72  Sodium 135 - 145 mmol/L 136 139 139  Potassium 3.5 - 5.1 mmol/L 3.9 3.4(L) 3.5  Chloride 101 - 111 mmol/L 103 100(L) 103  CO2 22 - 32 mmol/L 26 30 28   Calcium 8.9 - 10.3  mg/dL 9.5 9.3 9.0   No results found.  Assessment/Plan:  Soft tissue infection overlying prosthetic joint s/p I&D: Pt is POD2. She is afebrile and no leukocytosis. The intraop cultures were mislabeled and orthopedics have already spoken to micro lab. Synovial right hip culture shows moderate WBC- gram positive cocci in clusters. 2850 WBC cell count with 82% neutrophils. Blood cultures from 1/10 NGTD . Underwent debridement of skin and subcutaneous tissue of right hip with irrigation with ortho surgery on 1/13. Synovial fluid showing no growth. Superficial wound cultures growing Klebsiella and MRSA sensitive to cipro and cefazolin. Empiric vanc 1/11 to 1/13. - Cipro and Doxycycline started 1/14, continue until 1/27 per ID recs - Orthopedics following and appreciate recs - daily dry dressing change with 4x4s and ABDs, f/up with Dr. Linna CapriceSwinteck by 1/24  Constipation, resolved - Miralax and Senna PRN  Labial abscess: While doing the right hip I&D, a labial abscess was noted which was drained by general surgery, and a penrose drain removed - continue daily washing with mild soapy water  T2DM: Patient on home regimen of Novolog 70/30 18U BID as this is easier regimen  for her to allow decreased sticks; her last A1C in Nov 2017 was 10.5. On admission glucose was elevated to 400's which could be exacerbated by wound. infection. Currently CBGs in 300s - Novolog 70/30 to 28 units qAM and 22 units qPM - SSI-S  PT/OT rec 24-hour assist, HH PT/OT - family adamantly refusing SNF  Dispo: Anticipated discharge today.   LOS: 9 days   Althia Forts, MD 08/12/2016, 7:49 AM

## 2016-08-13 DIAGNOSIS — S72001A Fracture of unspecified part of neck of right femur, initial encounter for closed fracture: Secondary | ICD-10-CM | POA: Diagnosis not present

## 2016-08-13 DIAGNOSIS — E1165 Type 2 diabetes mellitus with hyperglycemia: Secondary | ICD-10-CM | POA: Diagnosis not present

## 2016-08-14 DIAGNOSIS — E119 Type 2 diabetes mellitus without complications: Secondary | ICD-10-CM | POA: Diagnosis not present

## 2016-08-14 DIAGNOSIS — L89612 Pressure ulcer of right heel, stage 2: Secondary | ICD-10-CM | POA: Diagnosis not present

## 2016-08-14 DIAGNOSIS — N764 Abscess of vulva: Secondary | ICD-10-CM | POA: Diagnosis not present

## 2016-08-14 DIAGNOSIS — T8131XD Disruption of external operation (surgical) wound, not elsewhere classified, subsequent encounter: Secondary | ICD-10-CM | POA: Diagnosis not present

## 2016-08-14 DIAGNOSIS — E440 Moderate protein-calorie malnutrition: Secondary | ICD-10-CM | POA: Diagnosis not present

## 2016-08-14 DIAGNOSIS — Z96641 Presence of right artificial hip joint: Secondary | ICD-10-CM | POA: Diagnosis not present

## 2016-08-14 DIAGNOSIS — L03115 Cellulitis of right lower limb: Secondary | ICD-10-CM | POA: Diagnosis not present

## 2016-08-14 DIAGNOSIS — L89312 Pressure ulcer of right buttock, stage 2: Secondary | ICD-10-CM | POA: Diagnosis not present

## 2016-08-15 DIAGNOSIS — L89312 Pressure ulcer of right buttock, stage 2: Secondary | ICD-10-CM | POA: Diagnosis not present

## 2016-08-15 DIAGNOSIS — E440 Moderate protein-calorie malnutrition: Secondary | ICD-10-CM | POA: Diagnosis not present

## 2016-08-15 DIAGNOSIS — N764 Abscess of vulva: Secondary | ICD-10-CM | POA: Diagnosis not present

## 2016-08-15 DIAGNOSIS — L89612 Pressure ulcer of right heel, stage 2: Secondary | ICD-10-CM | POA: Diagnosis not present

## 2016-08-15 DIAGNOSIS — E119 Type 2 diabetes mellitus without complications: Secondary | ICD-10-CM | POA: Diagnosis not present

## 2016-08-15 DIAGNOSIS — L03115 Cellulitis of right lower limb: Secondary | ICD-10-CM | POA: Diagnosis not present

## 2016-08-15 DIAGNOSIS — T8131XD Disruption of external operation (surgical) wound, not elsewhere classified, subsequent encounter: Secondary | ICD-10-CM | POA: Diagnosis not present

## 2016-08-15 DIAGNOSIS — Z96641 Presence of right artificial hip joint: Secondary | ICD-10-CM | POA: Diagnosis not present

## 2016-08-16 DIAGNOSIS — Z96641 Presence of right artificial hip joint: Secondary | ICD-10-CM | POA: Diagnosis not present

## 2016-08-16 DIAGNOSIS — S72041G Displaced fracture of base of neck of right femur, subsequent encounter for closed fracture with delayed healing: Secondary | ICD-10-CM | POA: Diagnosis not present

## 2016-08-16 DIAGNOSIS — L89612 Pressure ulcer of right heel, stage 2: Secondary | ICD-10-CM | POA: Diagnosis not present

## 2016-08-16 DIAGNOSIS — L89312 Pressure ulcer of right buttock, stage 2: Secondary | ICD-10-CM | POA: Diagnosis not present

## 2016-08-16 DIAGNOSIS — E119 Type 2 diabetes mellitus without complications: Secondary | ICD-10-CM | POA: Diagnosis not present

## 2016-08-16 DIAGNOSIS — N764 Abscess of vulva: Secondary | ICD-10-CM | POA: Diagnosis not present

## 2016-08-16 DIAGNOSIS — E440 Moderate protein-calorie malnutrition: Secondary | ICD-10-CM | POA: Diagnosis not present

## 2016-08-16 DIAGNOSIS — T8131XD Disruption of external operation (surgical) wound, not elsewhere classified, subsequent encounter: Secondary | ICD-10-CM | POA: Diagnosis not present

## 2016-08-16 DIAGNOSIS — L03115 Cellulitis of right lower limb: Secondary | ICD-10-CM | POA: Diagnosis not present

## 2016-08-17 DIAGNOSIS — L03115 Cellulitis of right lower limb: Secondary | ICD-10-CM | POA: Diagnosis not present

## 2016-08-17 DIAGNOSIS — T8131XD Disruption of external operation (surgical) wound, not elsewhere classified, subsequent encounter: Secondary | ICD-10-CM | POA: Diagnosis not present

## 2016-08-17 DIAGNOSIS — E440 Moderate protein-calorie malnutrition: Secondary | ICD-10-CM | POA: Diagnosis not present

## 2016-08-17 DIAGNOSIS — L89612 Pressure ulcer of right heel, stage 2: Secondary | ICD-10-CM | POA: Diagnosis not present

## 2016-08-17 DIAGNOSIS — N764 Abscess of vulva: Secondary | ICD-10-CM | POA: Diagnosis not present

## 2016-08-17 DIAGNOSIS — E119 Type 2 diabetes mellitus without complications: Secondary | ICD-10-CM | POA: Diagnosis not present

## 2016-08-17 DIAGNOSIS — L89312 Pressure ulcer of right buttock, stage 2: Secondary | ICD-10-CM | POA: Diagnosis not present

## 2016-08-17 DIAGNOSIS — Z96641 Presence of right artificial hip joint: Secondary | ICD-10-CM | POA: Diagnosis not present

## 2016-08-18 DIAGNOSIS — Z96641 Presence of right artificial hip joint: Secondary | ICD-10-CM | POA: Diagnosis not present

## 2016-08-18 DIAGNOSIS — E440 Moderate protein-calorie malnutrition: Secondary | ICD-10-CM | POA: Diagnosis not present

## 2016-08-18 DIAGNOSIS — E119 Type 2 diabetes mellitus without complications: Secondary | ICD-10-CM | POA: Diagnosis not present

## 2016-08-18 DIAGNOSIS — L03115 Cellulitis of right lower limb: Secondary | ICD-10-CM | POA: Diagnosis not present

## 2016-08-18 DIAGNOSIS — T8131XD Disruption of external operation (surgical) wound, not elsewhere classified, subsequent encounter: Secondary | ICD-10-CM | POA: Diagnosis not present

## 2016-08-18 DIAGNOSIS — L89612 Pressure ulcer of right heel, stage 2: Secondary | ICD-10-CM | POA: Diagnosis not present

## 2016-08-18 DIAGNOSIS — N764 Abscess of vulva: Secondary | ICD-10-CM | POA: Diagnosis not present

## 2016-08-18 DIAGNOSIS — L89312 Pressure ulcer of right buttock, stage 2: Secondary | ICD-10-CM | POA: Diagnosis not present

## 2016-08-19 DIAGNOSIS — L03115 Cellulitis of right lower limb: Secondary | ICD-10-CM | POA: Diagnosis not present

## 2016-08-19 DIAGNOSIS — E119 Type 2 diabetes mellitus without complications: Secondary | ICD-10-CM | POA: Diagnosis not present

## 2016-08-19 DIAGNOSIS — L89312 Pressure ulcer of right buttock, stage 2: Secondary | ICD-10-CM | POA: Diagnosis not present

## 2016-08-19 DIAGNOSIS — N764 Abscess of vulva: Secondary | ICD-10-CM | POA: Diagnosis not present

## 2016-08-19 DIAGNOSIS — L89612 Pressure ulcer of right heel, stage 2: Secondary | ICD-10-CM | POA: Diagnosis not present

## 2016-08-19 DIAGNOSIS — E440 Moderate protein-calorie malnutrition: Secondary | ICD-10-CM | POA: Diagnosis not present

## 2016-08-19 DIAGNOSIS — T8131XD Disruption of external operation (surgical) wound, not elsewhere classified, subsequent encounter: Secondary | ICD-10-CM | POA: Diagnosis not present

## 2016-08-19 DIAGNOSIS — Z96641 Presence of right artificial hip joint: Secondary | ICD-10-CM | POA: Diagnosis not present

## 2016-08-23 DIAGNOSIS — E440 Moderate protein-calorie malnutrition: Secondary | ICD-10-CM | POA: Diagnosis not present

## 2016-08-23 DIAGNOSIS — L03115 Cellulitis of right lower limb: Secondary | ICD-10-CM | POA: Diagnosis not present

## 2016-08-23 DIAGNOSIS — T8131XD Disruption of external operation (surgical) wound, not elsewhere classified, subsequent encounter: Secondary | ICD-10-CM | POA: Diagnosis not present

## 2016-08-23 DIAGNOSIS — N764 Abscess of vulva: Secondary | ICD-10-CM | POA: Diagnosis not present

## 2016-08-23 DIAGNOSIS — E119 Type 2 diabetes mellitus without complications: Secondary | ICD-10-CM | POA: Diagnosis not present

## 2016-08-23 DIAGNOSIS — L89612 Pressure ulcer of right heel, stage 2: Secondary | ICD-10-CM | POA: Diagnosis not present

## 2016-08-23 DIAGNOSIS — Z96641 Presence of right artificial hip joint: Secondary | ICD-10-CM | POA: Diagnosis not present

## 2016-08-23 DIAGNOSIS — L89312 Pressure ulcer of right buttock, stage 2: Secondary | ICD-10-CM | POA: Diagnosis not present

## 2016-08-25 DIAGNOSIS — T8131XD Disruption of external operation (surgical) wound, not elsewhere classified, subsequent encounter: Secondary | ICD-10-CM | POA: Diagnosis not present

## 2016-08-25 DIAGNOSIS — L03115 Cellulitis of right lower limb: Secondary | ICD-10-CM | POA: Diagnosis not present

## 2016-08-25 DIAGNOSIS — Z96641 Presence of right artificial hip joint: Secondary | ICD-10-CM | POA: Diagnosis not present

## 2016-08-25 DIAGNOSIS — E440 Moderate protein-calorie malnutrition: Secondary | ICD-10-CM | POA: Diagnosis not present

## 2016-08-25 DIAGNOSIS — L89312 Pressure ulcer of right buttock, stage 2: Secondary | ICD-10-CM | POA: Diagnosis not present

## 2016-08-25 DIAGNOSIS — L89612 Pressure ulcer of right heel, stage 2: Secondary | ICD-10-CM | POA: Diagnosis not present

## 2016-08-25 DIAGNOSIS — E119 Type 2 diabetes mellitus without complications: Secondary | ICD-10-CM | POA: Diagnosis not present

## 2016-08-25 DIAGNOSIS — N764 Abscess of vulva: Secondary | ICD-10-CM | POA: Diagnosis not present

## 2016-08-26 DIAGNOSIS — E440 Moderate protein-calorie malnutrition: Secondary | ICD-10-CM | POA: Diagnosis not present

## 2016-08-26 DIAGNOSIS — L89612 Pressure ulcer of right heel, stage 2: Secondary | ICD-10-CM | POA: Diagnosis not present

## 2016-08-26 DIAGNOSIS — Z96641 Presence of right artificial hip joint: Secondary | ICD-10-CM | POA: Diagnosis not present

## 2016-08-26 DIAGNOSIS — L89312 Pressure ulcer of right buttock, stage 2: Secondary | ICD-10-CM | POA: Diagnosis not present

## 2016-08-26 DIAGNOSIS — T8131XD Disruption of external operation (surgical) wound, not elsewhere classified, subsequent encounter: Secondary | ICD-10-CM | POA: Diagnosis not present

## 2016-08-26 DIAGNOSIS — N764 Abscess of vulva: Secondary | ICD-10-CM | POA: Diagnosis not present

## 2016-08-26 DIAGNOSIS — E119 Type 2 diabetes mellitus without complications: Secondary | ICD-10-CM | POA: Diagnosis not present

## 2016-08-26 DIAGNOSIS — L03115 Cellulitis of right lower limb: Secondary | ICD-10-CM | POA: Diagnosis not present

## 2016-08-30 DIAGNOSIS — L03115 Cellulitis of right lower limb: Secondary | ICD-10-CM | POA: Diagnosis not present

## 2016-08-30 DIAGNOSIS — L89612 Pressure ulcer of right heel, stage 2: Secondary | ICD-10-CM | POA: Diagnosis not present

## 2016-08-30 DIAGNOSIS — N764 Abscess of vulva: Secondary | ICD-10-CM | POA: Diagnosis not present

## 2016-08-30 DIAGNOSIS — L89312 Pressure ulcer of right buttock, stage 2: Secondary | ICD-10-CM | POA: Diagnosis not present

## 2016-08-30 DIAGNOSIS — T8131XD Disruption of external operation (surgical) wound, not elsewhere classified, subsequent encounter: Secondary | ICD-10-CM | POA: Diagnosis not present

## 2016-08-30 DIAGNOSIS — Z96641 Presence of right artificial hip joint: Secondary | ICD-10-CM | POA: Diagnosis not present

## 2016-08-30 DIAGNOSIS — E440 Moderate protein-calorie malnutrition: Secondary | ICD-10-CM | POA: Diagnosis not present

## 2016-08-30 DIAGNOSIS — E119 Type 2 diabetes mellitus without complications: Secondary | ICD-10-CM | POA: Diagnosis not present

## 2016-08-31 DIAGNOSIS — Z96641 Presence of right artificial hip joint: Secondary | ICD-10-CM | POA: Diagnosis not present

## 2016-08-31 DIAGNOSIS — E440 Moderate protein-calorie malnutrition: Secondary | ICD-10-CM | POA: Diagnosis not present

## 2016-08-31 DIAGNOSIS — N764 Abscess of vulva: Secondary | ICD-10-CM | POA: Diagnosis not present

## 2016-08-31 DIAGNOSIS — E119 Type 2 diabetes mellitus without complications: Secondary | ICD-10-CM | POA: Diagnosis not present

## 2016-08-31 DIAGNOSIS — L89312 Pressure ulcer of right buttock, stage 2: Secondary | ICD-10-CM | POA: Diagnosis not present

## 2016-08-31 DIAGNOSIS — L89612 Pressure ulcer of right heel, stage 2: Secondary | ICD-10-CM | POA: Diagnosis not present

## 2016-08-31 DIAGNOSIS — L03115 Cellulitis of right lower limb: Secondary | ICD-10-CM | POA: Diagnosis not present

## 2016-08-31 DIAGNOSIS — T8131XD Disruption of external operation (surgical) wound, not elsewhere classified, subsequent encounter: Secondary | ICD-10-CM | POA: Diagnosis not present

## 2016-09-01 DIAGNOSIS — L89312 Pressure ulcer of right buttock, stage 2: Secondary | ICD-10-CM | POA: Diagnosis not present

## 2016-09-01 DIAGNOSIS — Z96641 Presence of right artificial hip joint: Secondary | ICD-10-CM | POA: Diagnosis not present

## 2016-09-01 DIAGNOSIS — N764 Abscess of vulva: Secondary | ICD-10-CM | POA: Diagnosis not present

## 2016-09-01 DIAGNOSIS — T8131XD Disruption of external operation (surgical) wound, not elsewhere classified, subsequent encounter: Secondary | ICD-10-CM | POA: Diagnosis not present

## 2016-09-01 DIAGNOSIS — E440 Moderate protein-calorie malnutrition: Secondary | ICD-10-CM | POA: Diagnosis not present

## 2016-09-01 DIAGNOSIS — L89612 Pressure ulcer of right heel, stage 2: Secondary | ICD-10-CM | POA: Diagnosis not present

## 2016-09-01 DIAGNOSIS — E119 Type 2 diabetes mellitus without complications: Secondary | ICD-10-CM | POA: Diagnosis not present

## 2016-09-01 DIAGNOSIS — L03115 Cellulitis of right lower limb: Secondary | ICD-10-CM | POA: Diagnosis not present

## 2016-09-06 DIAGNOSIS — L89612 Pressure ulcer of right heel, stage 2: Secondary | ICD-10-CM | POA: Diagnosis not present

## 2016-09-06 DIAGNOSIS — Z96641 Presence of right artificial hip joint: Secondary | ICD-10-CM | POA: Diagnosis not present

## 2016-09-06 DIAGNOSIS — E119 Type 2 diabetes mellitus without complications: Secondary | ICD-10-CM | POA: Diagnosis not present

## 2016-09-06 DIAGNOSIS — N764 Abscess of vulva: Secondary | ICD-10-CM | POA: Diagnosis not present

## 2016-09-06 DIAGNOSIS — E440 Moderate protein-calorie malnutrition: Secondary | ICD-10-CM | POA: Diagnosis not present

## 2016-09-06 DIAGNOSIS — L03115 Cellulitis of right lower limb: Secondary | ICD-10-CM | POA: Diagnosis not present

## 2016-09-06 DIAGNOSIS — L89312 Pressure ulcer of right buttock, stage 2: Secondary | ICD-10-CM | POA: Diagnosis not present

## 2016-09-06 DIAGNOSIS — T8131XD Disruption of external operation (surgical) wound, not elsewhere classified, subsequent encounter: Secondary | ICD-10-CM | POA: Diagnosis not present

## 2016-09-07 DIAGNOSIS — L89612 Pressure ulcer of right heel, stage 2: Secondary | ICD-10-CM | POA: Diagnosis not present

## 2016-09-07 DIAGNOSIS — E440 Moderate protein-calorie malnutrition: Secondary | ICD-10-CM | POA: Diagnosis not present

## 2016-09-07 DIAGNOSIS — L89312 Pressure ulcer of right buttock, stage 2: Secondary | ICD-10-CM | POA: Diagnosis not present

## 2016-09-07 DIAGNOSIS — N764 Abscess of vulva: Secondary | ICD-10-CM | POA: Diagnosis not present

## 2016-09-07 DIAGNOSIS — L03115 Cellulitis of right lower limb: Secondary | ICD-10-CM | POA: Diagnosis not present

## 2016-09-07 DIAGNOSIS — T8131XD Disruption of external operation (surgical) wound, not elsewhere classified, subsequent encounter: Secondary | ICD-10-CM | POA: Diagnosis not present

## 2016-09-07 DIAGNOSIS — Z96641 Presence of right artificial hip joint: Secondary | ICD-10-CM | POA: Diagnosis not present

## 2016-09-07 DIAGNOSIS — E119 Type 2 diabetes mellitus without complications: Secondary | ICD-10-CM | POA: Diagnosis not present

## 2016-09-08 DIAGNOSIS — E119 Type 2 diabetes mellitus without complications: Secondary | ICD-10-CM | POA: Diagnosis not present

## 2016-09-08 DIAGNOSIS — T8131XD Disruption of external operation (surgical) wound, not elsewhere classified, subsequent encounter: Secondary | ICD-10-CM | POA: Diagnosis not present

## 2016-09-08 DIAGNOSIS — E440 Moderate protein-calorie malnutrition: Secondary | ICD-10-CM | POA: Diagnosis not present

## 2016-09-08 DIAGNOSIS — L03115 Cellulitis of right lower limb: Secondary | ICD-10-CM | POA: Diagnosis not present

## 2016-09-08 DIAGNOSIS — L89612 Pressure ulcer of right heel, stage 2: Secondary | ICD-10-CM | POA: Diagnosis not present

## 2016-09-08 DIAGNOSIS — Z96641 Presence of right artificial hip joint: Secondary | ICD-10-CM | POA: Diagnosis not present

## 2016-09-08 DIAGNOSIS — L89312 Pressure ulcer of right buttock, stage 2: Secondary | ICD-10-CM | POA: Diagnosis not present

## 2016-09-08 DIAGNOSIS — N764 Abscess of vulva: Secondary | ICD-10-CM | POA: Diagnosis not present

## 2016-09-13 DIAGNOSIS — S72001A Fracture of unspecified part of neck of right femur, initial encounter for closed fracture: Secondary | ICD-10-CM | POA: Diagnosis not present

## 2016-09-13 DIAGNOSIS — E1165 Type 2 diabetes mellitus with hyperglycemia: Secondary | ICD-10-CM | POA: Diagnosis not present

## 2016-10-11 DIAGNOSIS — E1165 Type 2 diabetes mellitus with hyperglycemia: Secondary | ICD-10-CM | POA: Diagnosis not present

## 2016-10-11 DIAGNOSIS — S72001A Fracture of unspecified part of neck of right femur, initial encounter for closed fracture: Secondary | ICD-10-CM | POA: Diagnosis not present

## 2016-10-12 DIAGNOSIS — S72091G Other fracture of head and neck of right femur, subsequent encounter for closed fracture with delayed healing: Secondary | ICD-10-CM | POA: Diagnosis not present

## 2016-10-12 DIAGNOSIS — Z8639 Personal history of other endocrine, nutritional and metabolic disease: Secondary | ICD-10-CM | POA: Diagnosis not present

## 2016-10-12 DIAGNOSIS — R69 Illness, unspecified: Secondary | ICD-10-CM | POA: Diagnosis not present

## 2016-11-09 DIAGNOSIS — R69 Illness, unspecified: Secondary | ICD-10-CM | POA: Diagnosis not present

## 2016-11-09 DIAGNOSIS — S72091G Other fracture of head and neck of right femur, subsequent encounter for closed fracture with delayed healing: Secondary | ICD-10-CM | POA: Diagnosis not present

## 2016-11-09 DIAGNOSIS — Z8639 Personal history of other endocrine, nutritional and metabolic disease: Secondary | ICD-10-CM | POA: Diagnosis not present

## 2016-11-11 DIAGNOSIS — S72001A Fracture of unspecified part of neck of right femur, initial encounter for closed fracture: Secondary | ICD-10-CM | POA: Diagnosis not present

## 2016-11-11 DIAGNOSIS — E1165 Type 2 diabetes mellitus with hyperglycemia: Secondary | ICD-10-CM | POA: Diagnosis not present

## 2016-11-18 ENCOUNTER — Other Ambulatory Visit: Payer: Self-pay | Admitting: Internal Medicine

## 2016-11-29 ENCOUNTER — Ambulatory Visit (INDEPENDENT_AMBULATORY_CARE_PROVIDER_SITE_OTHER): Payer: Medicare HMO | Admitting: Internal Medicine

## 2016-11-29 ENCOUNTER — Telehealth: Payer: Self-pay | Admitting: *Deleted

## 2016-11-29 ENCOUNTER — Encounter: Payer: Self-pay | Admitting: Internal Medicine

## 2016-11-29 VITALS — BP 155/46 | HR 56 | Temp 97.9°F | Ht 61.0 in | Wt 113.6 lb

## 2016-11-29 DIAGNOSIS — L89891 Pressure ulcer of other site, stage 1: Secondary | ICD-10-CM

## 2016-11-29 DIAGNOSIS — L89611 Pressure ulcer of right heel, stage 1: Secondary | ICD-10-CM

## 2016-11-29 DIAGNOSIS — R69 Illness, unspecified: Secondary | ICD-10-CM | POA: Diagnosis not present

## 2016-11-29 DIAGNOSIS — F0390 Unspecified dementia without behavioral disturbance: Secondary | ICD-10-CM

## 2016-11-29 DIAGNOSIS — E118 Type 2 diabetes mellitus with unspecified complications: Secondary | ICD-10-CM | POA: Diagnosis not present

## 2016-11-29 DIAGNOSIS — E1165 Type 2 diabetes mellitus with hyperglycemia: Secondary | ICD-10-CM

## 2016-11-29 DIAGNOSIS — Z833 Family history of diabetes mellitus: Secondary | ICD-10-CM | POA: Diagnosis not present

## 2016-11-29 DIAGNOSIS — L89152 Pressure ulcer of sacral region, stage 2: Secondary | ICD-10-CM | POA: Diagnosis not present

## 2016-11-29 DIAGNOSIS — Z794 Long term (current) use of insulin: Secondary | ICD-10-CM | POA: Diagnosis not present

## 2016-11-29 LAB — GLUCOSE, CAPILLARY: GLUCOSE-CAPILLARY: 535 mg/dL — AB (ref 65–99)

## 2016-11-29 LAB — POCT GLYCOSYLATED HEMOGLOBIN (HGB A1C): HEMOGLOBIN A1C: 11

## 2016-11-29 MED ORDER — INSULIN GLARGINE 100 UNIT/ML SOLOSTAR PEN: 30.0000 [IU] | PEN_INJECTOR | Freq: Every day | SUBCUTANEOUS | 11 refills | Status: DC

## 2016-11-29 MED ORDER — INSULIN ASPART 100 UNIT/ML FLEXPEN: 8.0000 [IU] | PEN_INJECTOR | Freq: Three times a day (TID) | SUBCUTANEOUS | 11 refills | Status: DC

## 2016-11-29 NOTE — Patient Instructions (Addendum)
[name],  It was a pleasure seeing you today. I would like for you to stop taking your 70/30 insulin. Instead, start taking Lantus 30 units in the evenings prior to bed. I have also started you on meal time insulin. Please inject 8 units of novolog three times a day with meals. If needed, you may follow the below correction factor:   For blood sugars > 200, please inject her normal 8 units with meals plus the additional units below:  240 - 280: 1 unit 280 - 320: 2 units 340 - 360: 3 units 360 - 400: 4 units 400 - 440: 5 units 440 - 480: 6 units 480 - 520: 7 units Anything higher, please call your doctor   Please check you sugar three times a day with meals and in the evenings prior to bed. Please record your blood sugar and bring a log with you to your next visit. If you have any questions or concerns, call our clinic at [phone] or after hours call [phone] and ask for the internal medicine resident on call. Thank you!  - Dr. Antony ContrasGuilloud

## 2016-11-29 NOTE — Telephone Encounter (Signed)
Call to patient's number.  Message for daughter to call.  Patient left before getting lab work.  Will need to see if patient can return tomorrow for labs only. Message was left on home and Cell numbers to call the Clinics.  Angelina OkGladys Alfonso Carden, RN 11/29/2016 4:37 PM.

## 2016-11-29 NOTE — Progress Notes (Signed)
   CC: DM follow up   HPI:  [name] is a 83 y.o. female with past medical history outlined below here for DM follow up. For the details of today's visit, please refer to the assessment and plan.  Past Medical History:  Diagnosis Date  . Alzheimer's dementia    "dx'd 05/2015; final stages" (06/15/2016)  . Complication of anesthesia    "problems waking up from gallbladder OR in 1978"  . COPD (chronic obstructive pulmonary disease) (HCC)   . High cholesterol   . History of blood transfusion 1996   "related to appendix OR"  . Infected surgical wound 07/2016   rt hip  . Type II diabetes mellitus (HCC) 1998    Review of Systems:  All pertinents listed in HPI, otherwise negative  Physical Exam:  Vitals:   11/29/16 1503  BP: (!) 155/46  Pulse: (!) 56  Temp: 97.9 F (36.6 C)  TempSrc: Oral  SpO2: 100%  Weight: 113 lb 9.6 oz (51.5 kg)  Height: 5\' 1"  (1.549 m)    Constitutional: Frail, elderly appearing woman  Cardiovascular: RRR, no murmurs, rubs, or gallops.  Pulmonary/Chest: CTAB, no wheezes, rales, or rhonchi.  Abdominal: Soft, non tender, non distended. +BS.  Extremities: Warm and well perfused. No edema.  Neurological: A&Ox3, CN II - XII grossly intact.  Skin: Right foot with healing stage 1 pressure ulcer, sacral region with healing stage 2 pressure ulcer.  Psychiatric: Normal mood and affect  Assessment & Plan:   See Encounters Tab for problem based charting.  Patient discussed with Dr. Cleda DaubE. Hoffman

## 2016-11-29 NOTE — Assessment & Plan Note (Addendum)
Patient is here today for diabetes follow-up. She is an 83 year old female with dementia. She lives at home with her daughter who is her primary caretaker and present today for the visit. Per daughter, patient's blood glucose has been uncontrolled with a wide range of 100-500 at home. She is currently prescribed NovoLog 70/30 28 units in the morning and 22 units in the evening. CBG today in clinic is 535. Daughter reports that she was previously on Lantus and mealtime insulin but this was changed to 70/30 in November 2017. Daughter reports because of dementia, the patient will intermittently refuse her insulin making it difficult to control her blood sugar. Daughter feels that she was better controlled on the long-acting insulin and is requesting to change back today. Daughter is also diabetic and very familiar with managing hyperglycemia and insulin administration. The daughter is disabled and stays home with the patient 24/7. Today the daughter was inquiring about a sliding scale for when her CBGs are uncontrolled. I feel that a correction factor in addition to a meal time insulin would be reasonable as the daughter seems capable of managing this.  -- Stop 70/30  -- Start Lantus 30 units QHS -- Start Novolog 8 units TID with meals with correction scale: 240 - 280: 1 unit 280 - 320: 2 units 340 - 360: 3 units 360 - 400: 4 units 400 - 440: 5 units 440 - 480: 6 units 480 - 520: 7 units -- Instructed her to check CBG 4 times a day and to bring her meter to her follow up appointment in 2 weeks

## 2016-11-29 NOTE — Assessment & Plan Note (Signed)
Ulcer appears almost completely healed, currently stage 1.  -- Continue to monitor

## 2016-11-30 NOTE — Addendum Note (Signed)
Addended by: Bufford SpikesFULCHER, Aizik Reh N on: 11/30/2016 01:57 PM   Modules accepted: Orders

## 2016-11-30 NOTE — Assessment & Plan Note (Signed)
Sacral region with a small 1 cm stage 2 decubitus ulcer. Healing well, no evidence of infection. -- Continue to monitor

## 2016-12-06 NOTE — Progress Notes (Signed)
Internal Medicine Clinic Attending  Case discussed with Dr. Guilloud at the time of the visit.  We reviewed the resident's history and exam and pertinent patient test results.  I agree with the assessment, diagnosis, and plan of care documented in the resident's note.  

## 2016-12-11 DIAGNOSIS — S72001A Fracture of unspecified part of neck of right femur, initial encounter for closed fracture: Secondary | ICD-10-CM | POA: Diagnosis not present

## 2016-12-11 DIAGNOSIS — E1165 Type 2 diabetes mellitus with hyperglycemia: Secondary | ICD-10-CM | POA: Diagnosis not present

## 2016-12-21 DIAGNOSIS — Z8639 Personal history of other endocrine, nutritional and metabolic disease: Secondary | ICD-10-CM | POA: Diagnosis not present

## 2016-12-21 DIAGNOSIS — S72091G Other fracture of head and neck of right femur, subsequent encounter for closed fracture with delayed healing: Secondary | ICD-10-CM | POA: Diagnosis not present

## 2017-01-11 DIAGNOSIS — E1165 Type 2 diabetes mellitus with hyperglycemia: Secondary | ICD-10-CM | POA: Diagnosis not present

## 2017-01-11 DIAGNOSIS — S72001A Fracture of unspecified part of neck of right femur, initial encounter for closed fracture: Secondary | ICD-10-CM | POA: Diagnosis not present

## 2017-01-26 IMAGING — RF DG FLUORO GUIDE NDL PLC/BX
1 series · 1 of 1 positions shown · non-contrast
Comparison: none

CLINICAL DATA: Cellulitis over recent right hip arthroplasty.

[Series 1: cp_standard · 0.29mm/px · 1 of 1 slices shown]
[im 1/1]
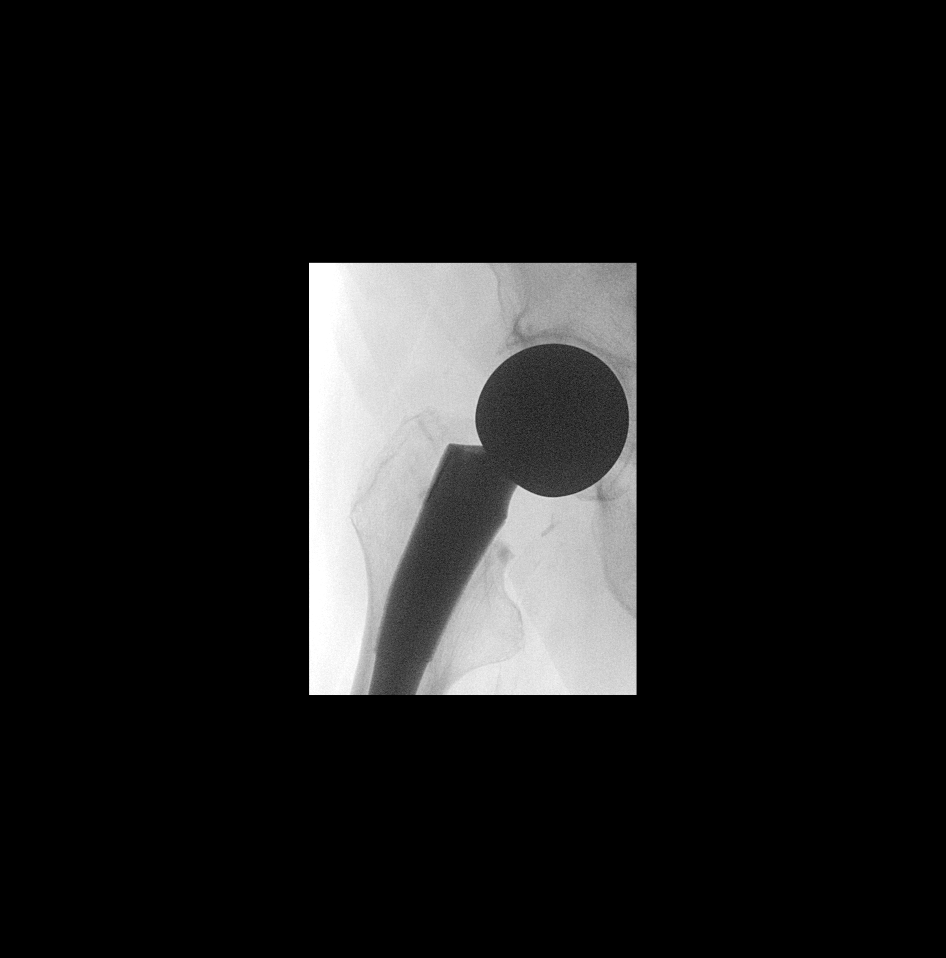

[1 of 1 positions shown; findings below may reference images not displayed]

EXAM:
RIGHT HIP ASPIRATION UNDER FLUOROSCOPY

FLUOROSCOPY TIME:  Fluoroscopy Time:  24 SECONDS

Radiation Exposure Index (if provided by the fluoroscopic device):
0.5 MGY

Number of Acquired Spot Images: 0

PROCEDURE:
The patient's right hip hemiarthroplasty incision showed indurated
cellulitic appearing skin peripherally which was avoided. Overlying
skin prepped with Betadine, draped in the usual sterile fashion, and
infiltrated locally with buffered Lidocaine. 20 gauge spinal needle
advanced to prosthetic femoral head neck junction with dry tap. The
pseudo capsule was targeted more inferiorly and the needle tip was
advanced to the metal trochanter where there was spontaneous return
of 4 cc serosanguineous fluid.
IMPRESSION: Technically successful right hip aspiration under fluoroscopy. 4 cc
of serosanguineous fluid was sent to the lab.

## 2017-01-31 ENCOUNTER — Telehealth: Payer: Self-pay | Admitting: Dietician

## 2017-01-31 ENCOUNTER — Ambulatory Visit (INDEPENDENT_AMBULATORY_CARE_PROVIDER_SITE_OTHER): Payer: Medicare HMO | Admitting: Dietician

## 2017-01-31 ENCOUNTER — Ambulatory Visit (INDEPENDENT_AMBULATORY_CARE_PROVIDER_SITE_OTHER): Payer: Medicare HMO | Admitting: Internal Medicine

## 2017-01-31 VITALS — BP 132/44 | HR 59 | Temp 98.2°F | Ht 61.0 in | Wt 113.2 lb

## 2017-01-31 DIAGNOSIS — E119 Type 2 diabetes mellitus without complications: Secondary | ICD-10-CM | POA: Diagnosis not present

## 2017-01-31 DIAGNOSIS — Z9114 Patient's other noncompliance with medication regimen: Secondary | ICD-10-CM | POA: Diagnosis not present

## 2017-01-31 DIAGNOSIS — Z794 Long term (current) use of insulin: Secondary | ICD-10-CM | POA: Diagnosis not present

## 2017-01-31 DIAGNOSIS — Z91013 Allergy to seafood: Secondary | ICD-10-CM

## 2017-01-31 DIAGNOSIS — F0391 Unspecified dementia with behavioral disturbance: Secondary | ICD-10-CM | POA: Diagnosis not present

## 2017-01-31 DIAGNOSIS — Z885 Allergy status to narcotic agent status: Secondary | ICD-10-CM | POA: Diagnosis not present

## 2017-01-31 DIAGNOSIS — Z884 Allergy status to anesthetic agent status: Secondary | ICD-10-CM

## 2017-01-31 DIAGNOSIS — E1165 Type 2 diabetes mellitus with hyperglycemia: Secondary | ICD-10-CM

## 2017-01-31 DIAGNOSIS — Z713 Dietary counseling and surveillance: Secondary | ICD-10-CM

## 2017-01-31 DIAGNOSIS — Z880 Allergy status to penicillin: Secondary | ICD-10-CM

## 2017-01-31 DIAGNOSIS — R69 Illness, unspecified: Secondary | ICD-10-CM | POA: Diagnosis not present

## 2017-01-31 MED ORDER — V-GO 20 KIT: 20.0000 [IU] | PACK | Freq: Every day | 11 refills | Status: DC

## 2017-01-31 MED ORDER — INSULIN PEN NEEDLE 31G X 6 MM MISC: 1.0000 [IU] | Freq: Four times a day (QID) | 3 refills | Status: DC

## 2017-01-31 MED ORDER — INSULIN ASPART 100 UNIT/ML ~~LOC~~ SOLN: 0.0000 [IU] | Freq: Every day | SUBCUTANEOUS | 11 refills | Status: DC

## 2017-01-31 NOTE — Progress Notes (Signed)
   CC: follow-up of type 2 diabetes  HPI:  Ms.Nusrat [name] is a 83 y.o. F with Mhx as outlined below who presents today for FU of type 2 diabetes. She presents with her daughter, her primary caretaker, who reports Ms. Sorci continues to intermittently refuse CBG checks and refuses most insulin secondary to her dementia. Her blood sugars have subsequently remained elevated to the 400s. Daughter inquires about Freestyle Libre blood glucose monitoring and about other solutions for her mothers refusal of insulin.   Past Medical History:  Diagnosis Date  . Alzheimer's dementia    "dx'd 05/2015; final stages" (06/15/2016)  . Complication of anesthesia    "problems waking up from gallbladder OR in 1978"  . COPD (chronic obstructive pulmonary disease) (HCC)   . High cholesterol   . History of blood transfusion 1996   "related to appendix OR"  . Infected surgical wound 07/2016   rt hip  . Type II diabetes mellitus (HCC) 1998   Review of Systems:  Limited due to patients dementia however: General: Denies fevers, chills Cardiac: Denies CP, SOB, palpitations Pulmonary: Denies cough, wheezes Abd: Changes in bowels Extremities: Denies swelling  Physical Exam: General: Frail elderly woman. Pleasant HEENT: No icterus, injection or ptosis.  Cardiac: RRR, no MGR appreciated Pulmonary: CTA BL with normal WOB on RA. Abd: Soft, non-tended. +bs Extremities: Warm, perfused. No significant pedal edema.   Vitals:   01/31/17 1328  BP: (!) 132/44  Pulse: (!) 59  Temp: 98.2 F (36.8 C)  TempSrc: Oral  SpO2: 98%  Weight: 113 lb 3.2 oz (51.3 kg)  Height: 5\' 1"  (1.549 m)    Assessment & Plan:   See Encounters Tab for problem based charting.  Patient discussed with Dr. Cleda DaubE. Hoffman

## 2017-01-31 NOTE — Progress Notes (Signed)
Diabetes Self-Management Education  Visit Type: First/Initial  Appt. Start Time: 1415 Appt. End Time: 1445  02/01/2017  Ms. Naavya Hirschman, identified by name and date of birth, is a 83 y.o. female with a diagnosis of Diabetes: Type 2.   ASSESSMENT  Taught Ms. Rohr's daughter how to use the VGo20 today. Suggested placement where patient less likely to notice it. Ms. Erik Obeygosto's daughter was provided with a month's supply of Vgo20 and an ez fill. She has Novolog at home and a prescription was sent in for Novolog. Her daughter repeated back the demonstration without problems and verbalized understanding to instructions about how to use it.  Her daughter says she cannot get the second injection in her mother and hopes the Vgo will better control her mother's blood sugar and improve her health.       Diabetes Self-Management Education - 01/31/17 1600      Visit Information   Visit Type First/Initial     Initial Visit   Diabetes Type Type 2   Are you currently following a meal plan? Yes   What type of meal plan do you follow? balanced low sugar meals, carb consistent   Are you taking your medications as prescribed? No  does not take Pm injection     Health Coping   How would you rate your overall health? Fair     Psychosocial Assessment   Patient Belief/Attitude about Diabetes --  unable to assess   Self-care barriers Debilitated state due to current medical condition;Unsteady gait/risk for falls  dementia   Self-management support Doctor's office;Family;CDE visits   Other persons present Family Member   Patient Concerns Glycemic Control   Special Needs Instruct caregiver   Preferred Learning Style No preference indicated   Learning Readiness Ready   How often do you need to have someone help you when you read instructions, pamphlets, or other written materials from your doctor or pharmacy? 1 - Never     Pre-Education Assessment   Patient understands using medications safely.  Needs Instruction  on VGo for insulin administration     Complications   Number of hypoglycemic episodes per month --  0   Are you checking your feet? Yes   How many days per week are you checking your feet? 7     Patient Education   Medications Other (comment)  taught daughter how to fill place and use VGo 20     Individualized Goals (developed by patient)   Medications take my medication as prescribed     Outcomes   Expected Outcomes Demonstrated interest in learning. Expect positive outcomes   Future DMSE 2 wks   Program Status Completed      Individualized Plan for Diabetes Self-Management Training:   Learning Objective:  Patient will have a greater understanding of diabetes self-management. Patient education plan is to attend individual and/or group sessions per assessed needs and concerns.   Plan:   Patient Instructions  My plan to support myself in continuing these changes to care for my diabetes is to attend or contact:    Diabetes Support Groups  1- Type 2 diabetes support group : 2nd Monday of every month from 6-7 PM at [address] [facility] conference room [phone]  2- Other support resources -doctor's office, CDE, Dietitian, pharmacist, church     Expected Outcomes:  Demonstrated interest in learning. Expect positive outcomes  Education material provided: Meal plan card  If problems or questions, patient to contact team via:  Phone  Future DSME appointment: 2 wks Libbi Towner, Lupita Leash, RD 02/01/2017 4:59 PM.

## 2017-01-31 NOTE — Assessment & Plan Note (Signed)
Most recent HbA1c 11% 11/2016. At that time she was prescribed 30 units basal insulin + mealtime novolog and sliding scale however pt continues to refuse insulin intermittently and CBGs as well. Daughter reports that her blood sugars have subsequently remained uncontrolled and wonders about alternatives to frequent CBGs and injections. She specifically inquired about Freestyle libre. I believe patient meets criteria for coverage as she has poorly controlled DM2 requiring frequent CBGs and multiple insulin injections daily, based on her transient blood sugar levels. Patient is agreeable to wearing the Freestyle as well.  I believe the patient would also benefit from V-go placement as well in order to improve compliance and reduce discomfort. She is also agreeable to trying the V-go device.  -Have consulted our DM nutritionist to assist [place] with obtaining Freestyle & V-go  -Will prescribe V-go 20 unit Kit + U-100 for meals. The patient and her daughter will receive counseling on this prior to use -Family to use current insulin regimen until devices received -Follow-up 2 weeks to ensure progress and good control

## 2017-01-31 NOTE — Patient Instructions (Addendum)
It was a pleasure seeing you today! I'm sorry you are having difficulties with your insulin and checking your blood sugars.   I have sent in a prescription for more pen needles and syringes.   I have also referred you to work with Lupita Leashonna today. She is working on obtaining the Diplomatic Services operational officerpersonal Freestyle for you to help with blood sugar monitoring. We also discussed the possibility of the insulin "patch" which will eliminate the other forms of insulin.   Please come back in 2 weeks to see how you are doing.

## 2017-01-31 NOTE — Telephone Encounter (Signed)
[facility] is not in contract with [employer] or [employer] for [employer] Libre CGM. [facility]. Will hold off for now as unsure if patient wants to pursue this at this time.

## 2017-02-01 ENCOUNTER — Telehealth: Payer: Self-pay | Admitting: Dietician

## 2017-02-01 NOTE — Patient Instructions (Signed)
My plan to support myself in continuing these changes to care for my diabetes is to attend or contact:    Diabetes Support Groups  1- Type 2 diabetes support group : 2nd Monday of every month from 6-7 PM at [address] [facility] conference room [phone]  2- Other support resources -doctor's office, CDE, Dietitian, pharmacist, church

## 2017-02-02 NOTE — Telephone Encounter (Signed)
Daughter returned call saying she had not started VGo, she is using her mom's usual insulin. She is afraid her mom will take it off and she doesn't want to waste the real VGos. She called customer support who suggested she try a demo or two that does not have medicine in it. She says her mom's insurance also denied the VGo (excpected). I let her know we'll be on the look out for a PA from them.  Put 5 demos up front for deborah to pick up.

## 2017-02-03 NOTE — Progress Notes (Signed)
Internal Medicine Clinic Attending  Case discussed with Dr. Molt at the time of the visit.  We reviewed the resident's history and exam and pertinent patient test results.  I agree with the assessment, diagnosis, and plan of care documented in the resident's note. 

## 2017-02-06 ENCOUNTER — Other Ambulatory Visit: Payer: Self-pay | Admitting: *Deleted

## 2017-02-06 MED ORDER — V-GO 20 KIT: 20.0000 [IU] | PACK | Freq: Every day | 11 refills | Status: DC

## 2017-02-09 ENCOUNTER — Encounter: Payer: Self-pay | Admitting: Internal Medicine

## 2017-02-09 ENCOUNTER — Ambulatory Visit (INDEPENDENT_AMBULATORY_CARE_PROVIDER_SITE_OTHER): Payer: Medicare HMO | Admitting: Internal Medicine

## 2017-02-09 VITALS — BP 129/71 | HR 96 | Temp 98.5°F | Ht 61.0 in | Wt 111.4 lb

## 2017-02-09 DIAGNOSIS — F0390 Unspecified dementia without behavioral disturbance: Secondary | ICD-10-CM | POA: Diagnosis not present

## 2017-02-09 DIAGNOSIS — E1165 Type 2 diabetes mellitus with hyperglycemia: Secondary | ICD-10-CM | POA: Diagnosis not present

## 2017-02-09 DIAGNOSIS — Z9114 Patient's other noncompliance with medication regimen: Secondary | ICD-10-CM | POA: Diagnosis not present

## 2017-02-09 DIAGNOSIS — Z86718 Personal history of other venous thrombosis and embolism: Secondary | ICD-10-CM | POA: Insufficient documentation

## 2017-02-09 DIAGNOSIS — M7989 Other specified soft tissue disorders: Secondary | ICD-10-CM

## 2017-02-09 DIAGNOSIS — Z794 Long term (current) use of insulin: Secondary | ICD-10-CM | POA: Diagnosis not present

## 2017-02-09 DIAGNOSIS — R69 Illness, unspecified: Secondary | ICD-10-CM | POA: Diagnosis not present

## 2017-02-09 MED ORDER — ENOXAPARIN SODIUM 150 MG/ML ~~LOC~~ SOLN
1.5000 mg/kg | Freq: Once | SUBCUTANEOUS | Status: AC
  Administered 2017-02-09: 75 mg via SUBCUTANEOUS

## 2017-02-09 NOTE — Assessment & Plan Note (Signed)
The patient's last A1C in 11/2016 was 11%. The patient's daughter states that her mom has had increased urination over the past week with a change in the odor of her urine. The increase in confusion from baseline is concerning for urinary tract infection, but it is possible that she is have increased urination as a result of uncontrolled diabetes. Either way, a urinalysis will be obtained today. The patient was encouraged to continue daily use of Novolog 70/30 BID and her daughter is aware of the relationship between uncontrolled diabetes and urinary tract infections.  Plan: -Follow up urinalysis -Continue Novolog 70/30 BID

## 2017-02-09 NOTE — Assessment & Plan Note (Signed)
The patient's acute onset of unilateral swelling, history of immobility, and PE with pitting edema is concerning for DVT. Other possible diagnoses include cellulitis, however the patient does not have systemic signs of infection or erythema on PE. Another cause of unilateral lower extremity obstruction is a mass compressing the venous or lymphatic drainage of the leg. This is less likely given the acute onset of symptoms.  The patient does not have a history of blood clots, but has taken Lovenox previously when she was recovering from her hip surgery and subsequent infection. The patient tolerated this medication well. She was given a dose of Lovenox in clinic (1.5 mg/kg) and scheduled for ultrasound of the lower extremity tomorrow morning. She will return to the clinic tomorrow afternoon to discuss the results of the ultrasound and possible therapy for treatment of DVT.  Plan: -Lovenox (1.5 mg/kg) today, 7/19 -Ultrasound of LLE on 7/20 with return to clinic after examination for further discussion/treatment

## 2017-02-09 NOTE — Patient Instructions (Addendum)
Thank you for coming to see us today.  We think you have a blood clot in your leg and gave you an injection in clinic today to help treat this blood clot and prevent it from getting worse. You will be called tomorrow to come back to the hospital to get an ultrasound of your left leg to look for this clot.   You should get a call from the clinic to make an appointment tomorrow after your ultrasound. You will need to come back to see me, Dr. Saunders RevelNedrud, to discuss the test results and to discuss if you need more treatments.  Please call the clinic if you have any questions or if you do not hear about the ultrasound/upcoming clinic appointment.

## 2017-02-09 NOTE — Progress Notes (Signed)
   CC: Left leg swelling  HPI:  [name] is a 83 y.o. with a PMH of dementia who presents today for acute onset leg swelling. Much of the history is provided by the patient's primary caregiver, her daughter, who accompanied her to today's visit. The patient's daughter first noticed the left leg swelling 3 days ago. She says that the swelling has gotten worse over the past 3 days but the patient has not complained of any pain in her leg, chest pain, fever, or SOB. The patient does walk around with assistance of her daughter but is mainly immobile during the day. The patient's mobility has decreased since she broke her right hip in Dec 2017 and had surgery with a subsequent infection of the surgical incision.  The daughter reports that the patient doesn't like to take medications and has stopped taking all previously prescribed oral medication. The only medication she takes is Novolog 70/30 BID (which she cannot always take twice a day).   The patient's daughter states that she is concerned her mother has a urine infection because she has been urinating more frequently and the odor of her urine has changed over the past 7 days. She thinks that the patient has become more confused than her baseline also during this time. She says that the patient has not complained of any pain with urination.   Past Medical History:  Diagnosis Date  . Alzheimer's dementia    "dx'd 05/2015; final stages" (06/15/2016)  . Complication of anesthesia    "problems waking up from gallbladder OR in 1978"  . COPD (chronic obstructive pulmonary disease) (HCC)   . High cholesterol   . History of blood transfusion 1996   "related to appendix OR"  . Infected surgical wound 07/2016   rt hip  . Type II diabetes mellitus (HCC) 1998   Review of Systems:   Review of systems limited to patient's dementia but the patient (and daughter) denied change in bowel habits, headaches, fevers, and focal weakness of upper or lower  extremities.  Physical Exam:  Vitals:   02/09/17 1523  BP: 129/71  Pulse: 96  Temp: 98.5 F (36.9 C)  TempSrc: Oral  SpO2: 98%  Weight: 111 lb 6.4 oz (50.5 kg)  Height: 5\' 1"  (1.549 m)   Physical Exam  Constitutional:  Frail appearing woman sitting in wheelchair in no acute distress.  Cardiovascular: Normal rate, regular rhythm and normal heart sounds.   No murmur heard. No crackles appreciated.  Pulmonary/Chest: Effort normal and breath sounds normal. No respiratory distress. She has no wheezes.  Abdominal: Soft. She exhibits no distension. There is no tenderness.  Musculoskeletal:  Left lower extremity is swollen up to mid thigh compared to right. LLE pitting edema to knee. No overlying erythema or calor. No pitting edema on RLE. Popliteal and posterior tibial pulses decreased on LLE compared to RLE.   Neurological:  Patient is oriented to person, but not to time or place. Patient recognizes husband and daughter during exam. Otherwise unable to cooperate with complete neurologic exam. Patient was able to stand with assistance but could not walk on her own.   Skin: Skin is warm and dry. Capillary refill takes less than 2 seconds. No rash noted.    Assessment & Plan:   See Encounters Tab for problem based charting.  Patient seen with Dr. Rogelia BogaButcher.

## 2017-02-10 ENCOUNTER — Ambulatory Visit (INDEPENDENT_AMBULATORY_CARE_PROVIDER_SITE_OTHER): Payer: Medicare HMO | Admitting: Internal Medicine

## 2017-02-10 ENCOUNTER — Encounter: Payer: Self-pay | Admitting: Internal Medicine

## 2017-02-10 ENCOUNTER — Ambulatory Visit (HOSPITAL_COMMUNITY)
Admission: RE | Admit: 2017-02-10 | Discharge: 2017-02-10 | Disposition: A | Discharge status: Home / Self Care | Payer: Medicare HMO | Source: Ambulatory Visit | Attending: Oncology | Admitting: Oncology

## 2017-02-10 VITALS — BP 141/67 | HR 57

## 2017-02-10 DIAGNOSIS — R936 Abnormal findings on diagnostic imaging of limbs: Secondary | ICD-10-CM | POA: Insufficient documentation

## 2017-02-10 DIAGNOSIS — Z993 Dependence on wheelchair: Secondary | ICD-10-CM | POA: Diagnosis not present

## 2017-02-10 DIAGNOSIS — J449 Chronic obstructive pulmonary disease, unspecified: Secondary | ICD-10-CM

## 2017-02-10 DIAGNOSIS — M7989 Other specified soft tissue disorders: Secondary | ICD-10-CM | POA: Insufficient documentation

## 2017-02-10 DIAGNOSIS — Z7901 Long term (current) use of anticoagulants: Secondary | ICD-10-CM

## 2017-02-10 DIAGNOSIS — E1165 Type 2 diabetes mellitus with hyperglycemia: Secondary | ICD-10-CM | POA: Diagnosis not present

## 2017-02-10 DIAGNOSIS — R69 Illness, unspecified: Secondary | ICD-10-CM | POA: Diagnosis not present

## 2017-02-10 DIAGNOSIS — Z794 Long term (current) use of insulin: Secondary | ICD-10-CM

## 2017-02-10 DIAGNOSIS — G309 Alzheimer's disease, unspecified: Secondary | ICD-10-CM

## 2017-02-10 DIAGNOSIS — N390 Urinary tract infection, site not specified: Secondary | ICD-10-CM

## 2017-02-10 DIAGNOSIS — F0280 Dementia in other diseases classified elsewhere without behavioral disturbance: Secondary | ICD-10-CM

## 2017-02-10 DIAGNOSIS — I82412 Acute embolism and thrombosis of left femoral vein: Secondary | ICD-10-CM

## 2017-02-10 DIAGNOSIS — S72001A Fracture of unspecified part of neck of right femur, initial encounter for closed fracture: Secondary | ICD-10-CM | POA: Diagnosis not present

## 2017-02-10 LAB — MICROSCOPIC EXAMINATION
Casts: NONE SEEN /lpf
EPITHELIAL CELLS (NON RENAL): NONE SEEN /HPF (ref 0–10)
WBC, UA: >30 /hpf — AB (ref 0–?)

## 2017-02-10 LAB — URINALYSIS, ROUTINE W REFLEX MICROSCOPIC
BILIRUBIN UA: NEGATIVE
NITRITE UA: NEGATIVE
Specific Gravity, UA: >=1.03 — AB (ref 1.005–1.030)
UUROB: 0.2 mg/dL (ref 0.2–1.0)
pH, UA: 5 (ref 5.0–7.5)

## 2017-02-10 MED ORDER — LEVOFLOXACIN 250 MG PO TABS: 250.0000 mg | ORAL_TABLET | Freq: Every day | ORAL | 0 refills | Status: AC

## 2017-02-10 NOTE — Patient Instructions (Addendum)
Thank you for seeing us today.  You have a blood clot in your leg. Please start taking Xarelto, 15 mg. You will take 2 tablets once a day. This will happen for 3 weeks. After 3 weeks, you will take only 1 pill a day.  You have a urinary tract infection. Please start taking levofloxacin, 250 mg tablets. You will take 1 tablet daily for 5 days.  You will return to clinic in 5-7 days. You have enough Xarelto samples to last 1 week but do not have medication past this date because we want to evaluate how your mother is able to take this medication on a daily basis.

## 2017-02-10 NOTE — Assessment & Plan Note (Addendum)
The patient's ultrasound on 02/11/2016 showed DVT in left iliac, common femoral, popliteal and calf veins. The patient's DVT is classified as provoked, given her history of recent hospitalization in the beginning of the hear for R hip surgery and subsequent immobilization 2/2 wound infection. The patient continues to be immobile and will likely require long-term anticoagulation. The options for anticoagulation therapy were discussed. It was eventually decided that the patient will start taking oral rivaroxaban. She will take 15 mg BID for 3 weeks as a loading dose and then will take 15 mg each day for a maintenance dose.   The patient's caregiver expressed concern regarding the patient's compliance with oral medications in the past - she is able to take them but sometimes refuses to take medication depending upon the day. Her caregiver expressed interest in doing Lovenox injections, as the patient tolerated them well after her R hip replacement. At the same time, however, the patient's caregiver states that the patient sometimes refuses insulin injections and that these are difficult to give every day. She would like to try the oral medications for 1 week to see if her mother will take it and then switch to Lovenox if she cannot tolerate oral medication. The patient will follow up in 5-7 days to determine if the oral medication is a good regimen.   Plan: -Start rivoraxaban 15 mg BID. The patient was given samples of this to last 7 days in clinic. A prescription was not sent to the pharmacy, as the caregiver wants to make sure a change in regimen is not needed. She knows she must return to clinic within the next week for more medicine. -Patient will return to clinic in 5-7 days to determine if she is able to take this medication. The patient's caregiver was instructed to call clinic if she has questions or if patient refuses oral medication all together so a new plan can be developed.

## 2017-02-10 NOTE — Assessment & Plan Note (Signed)
The patient's urinalysis obtained at yesterday's visit was consistent with a UTI, showing +LE and >10-20 WBCs. The patient's symptoms of increased confusion, change in odor of urine, decreased appetite, and possible increase urinary frequency are indicators for treatment of this infection. The patient has anaphylaxis to penicillins, so levofloxacin 250 mg was prescribed. She was instructed to take 1 250 mg tablet daily for 5 days. The patient's care giver expressed understanding of these instructions and will monitor her symptoms with treatment.  Plan: -Continue Novolog 70/30 as previously instructed -Start levaquin 250 mg daily for 5 days -Return to clinic in 5-7 days to monitor symptom resolution

## 2017-02-10 NOTE — Progress Notes (Signed)
*  PRELIMINARY RESULTS* Vascular Ultrasound Left lower extremity venous duplex has been completed.  Preliminary findings: Left lower extremity appears positive for age indeterminate thrombosis in the left iliac, common femoral, profunda, femoral, popliteal and calf veins.  Attempted to image IVC but limited views due to bowel and gas, the very proximal portion appears patent.  Preliminary results called to Dr. Romilda JoyGanfortuna's office, given to Dr. Saunders RevelNedrud @ 15:35. Chauncey FischerCharlotte C Keoshia Steinmetz 02/10/2017, 3:38 PM

## 2017-02-10 NOTE — Progress Notes (Signed)
   CC: UTI and DVT follow up  HPI:  [name] is a 83 y.o. with a PMH of T2DM on insulin, COPD, and Alzheimer's dementia who was seen in clinic 24 hours ago with acute onset of left leg swelling which occurred 4 days ago. At her clinic visit yesterday the patient was given Lovenox for suspected DVT and venous ultrasound was ordered for today (02/10/2017). The patient presents with her daughter who is her main caregiver and assisted with history taking. The patient's left lower extremity swelling acutely worsened last evening but the patient's daughter massaged her leg until it felt better and some of the swelling subsided. Since then, the swelling has not worsened.   The patient has not complained of chest pain, shortness of breath, cough, or difficulty breathing.   Past Medical History:  Diagnosis Date  . Alzheimer's dementia    "dx'd 05/2015; final stages" (06/15/2016)  . Complication of anesthesia    "problems waking up from gallbladder OR in 1978"  . COPD (chronic obstructive pulmonary disease) (HCC)   . High cholesterol   . History of blood transfusion 1996   "related to appendix OR"  . Infected surgical wound 07/2016   rt hip  . Type II diabetes mellitus (HCC) 1998   Review of Systems:   ROS unable to be obtained secondary to patient's dementia. Caregiver endorses continued change in the odor of the patient's urine. The caregiver denies that the patient has had fever, change in bowel movements, and increased urination since yesterday.  Physical Exam:  Vitals:   02/10/17 1608  BP: (!) 141/67  Pulse: (!) 57  SpO2: 100%   Physical Exam  Constitutional:  Frail appearing woman sitting comfortably in wheel chair in no acute distress.   Cardiovascular: Normal rate, regular rhythm, normal heart sounds and intact distal pulses.  Exam reveals no friction rub.   No murmur heard. Pulmonary/Chest: Effort normal and breath sounds normal. No respiratory distress. She has no  wheezes.  Abdominal: Soft. She exhibits no distension. There is no tenderness. There is no guarding.  Musculoskeletal:  Left lower extremity swelling from sole of foot to mid thigh; interval improvement in swelling since yesterday. 1+ pitting edema up to left knee. No overlying skin change. Skin is warm on lower extremities bilaterally. No swelling of RLE.   Skin: Skin is warm and dry. Capillary refill takes less than 2 seconds. No rash noted.  Psychiatric:  The patient has a very pleasant demeanor. She is aware to person and family. Patient recognizes provider from previous visit but not aware to setting, city, or year.   Assessment & Plan:   See Encounters Tab for problem based charting.  Patient seen with Dr. Cyndie ChimeGranfortuna.

## 2017-02-11 NOTE — Progress Notes (Signed)
Medicine attending: I personally interviewed and briefly examined this patient on the day of the patient visit and reviewed pertinent clinical ,laboratory, and radiographic data  with resident physician Dr. Rozann LeschesMarybeth Nedrud and we discussed a management plan. Suspected dx of LLE proximal DVT confirmed; likely provoked given patient bed to chair existence. She received 1.5 mg/kg dose of lovenox yesterday pending doppler results. The patient is clinically stable and can be treated as an outpatient. We will Rx with Xarelto 15 mg PO BID x 3 weeks then 15 mg daily in view of advanced age. Also determined to have a UTI which will also be treated at this time. Pt daughter and husband here. Rx plan reviewed; all concerns addressed.

## 2017-02-13 NOTE — Progress Notes (Signed)
Internal Medicine Clinic Attending  I saw and evaluated the patient.  I personally confirmed the key portions of the history and exam documented by Dr. Nedrud and I reviewed pertinent patient test results.  The assessment, diagnosis, and plan were formulated together and I agree with the documentation in the resident's note.  

## 2017-02-17 ENCOUNTER — Ambulatory Visit (INDEPENDENT_AMBULATORY_CARE_PROVIDER_SITE_OTHER): Payer: Medicare HMO | Admitting: Internal Medicine

## 2017-02-17 ENCOUNTER — Encounter: Payer: Self-pay | Admitting: Internal Medicine

## 2017-02-17 DIAGNOSIS — Z993 Dependence on wheelchair: Secondary | ICD-10-CM | POA: Diagnosis not present

## 2017-02-17 DIAGNOSIS — Z7901 Long term (current) use of anticoagulants: Secondary | ICD-10-CM | POA: Diagnosis not present

## 2017-02-17 DIAGNOSIS — G309 Alzheimer's disease, unspecified: Secondary | ICD-10-CM

## 2017-02-17 DIAGNOSIS — J449 Chronic obstructive pulmonary disease, unspecified: Secondary | ICD-10-CM

## 2017-02-17 DIAGNOSIS — E1169 Type 2 diabetes mellitus with other specified complication: Secondary | ICD-10-CM | POA: Diagnosis not present

## 2017-02-17 DIAGNOSIS — I82412 Acute embolism and thrombosis of left femoral vein: Secondary | ICD-10-CM

## 2017-02-17 DIAGNOSIS — R69 Illness, unspecified: Secondary | ICD-10-CM | POA: Diagnosis not present

## 2017-02-17 DIAGNOSIS — F0280 Dementia in other diseases classified elsewhere without behavioral disturbance: Secondary | ICD-10-CM | POA: Diagnosis not present

## 2017-02-17 MED ORDER — RIVAROXABAN 15 MG PO TABS: ORAL_TABLET | ORAL | 3 refills | Status: DC

## 2017-02-17 MED ORDER — RIVAROXABAN 15 MG PO TABS: 15.0000 mg | ORAL_TABLET | Freq: Two times a day (BID) | ORAL | 0 refills | Status: DC

## 2017-02-17 NOTE — Assessment & Plan Note (Addendum)
The patients lower extremity swelling has improved since her last visit and the patient reports that her leg no long hurts her. The patient has been able to take this medication without complaint. She was instructed to continue taking rivaroxaban 15 mg BID for the next two weeks. Per discussion with Dr. Cyndie ChimeGranfortuna on 02/10/2017 to adjust for the patient's age, the patient will take 15 mg daily for her maintainance dose after 14 days. The patient will need anti-coagulation indefinitely as this is    The patient's caregiver was told to ensure that the patient eat when she takes this medication. She was also instructed to examine her mother for signs of GI bleeding, as this risk was shown to be elevated in elderly individuals taking Xarelto to reduce stroke risk in a-fibrillation. The patient's caregiver understands the importance of taking this medication and expressed understanding of potential side effects.  The patient was instructed to follow up in the clinic if the leg swelling becomes acutely worse or if the patient develops difficulty breathing with exertion or at rest. The patient will follow up with her PCP for management of her chronic medical conditions in 1-3 months and in 2 weeks for Diabetes Education.

## 2017-02-17 NOTE — Progress Notes (Signed)
   CC: DVT follow up  HPI:  Sharon Butler is a 83 y.o. with a PMH of Alzheimer's dementia, COPD, and T2DM who is presenting for evaluation of recently diagnosed DVT. The patient was last seen in clinic on 02/11/2016 at which time she was started Xarelto, 15 mg BID for treatment of a LLE DVT. She was told to monitor her symptoms and follow up in 1 week to see how this medication regimen was working. The patient is accompanied to today's visit by her daughter who is her main caregiver and provides assistance with the history. Since her last visit, the patient and her daughter have notice improvement in her left lower extremity swelling. She no longer reports pain throughout the day, but does notice some tenderness in the left leg when she first wakes up in the morning. The patient has been able to take 15 mg of Xarelto BID as previously prescribed. She has not had any difficulties with this medication. Her daughter often gives it with meals, as it is easy to crush the medication into her mother's food to ensure that she takes it.  Past Medical History:  Diagnosis Date  . Alzheimer's dementia    "dx'd 05/2015; final stages" (06/15/2016)  . Complication of anesthesia    "problems waking up from gallbladder OR in 1978"  . COPD (chronic obstructive pulmonary disease) (HCC)   . High cholesterol   . History of blood transfusion 1996   "related to appendix OR"  . Infected surgical wound 07/2016   rt hip  . Type II diabetes mellitus (HCC) 1998   Review of Systems:  Patient's ROS limited 2/2 Alzheimer's dementia Patient's caregiver denies reports of chest pain, shortness of breath, fevers, diaphoresis, nausea, dark/tarry stools, hematemesis and change in bladder habits.  Physical Exam:  Vitals:   02/17/17 1532  BP: (!) 152/71  Pulse: 60  Temp: 98.2 F (36.8 C)  TempSrc: Oral  SpO2: 98%  Weight: 113 lb 14.4 oz (51.7 kg)  Height: 5\' 1"  (1.549 m)   Physical Exam  Constitutional: She  appears well-developed and well-nourished. No distress.  Cardiovascular: Normal rate, regular rhythm, normal heart sounds and intact distal pulses.  Exam reveals no friction rub.   No murmur heard. Pulmonary/Chest: Effort normal and breath sounds normal. No respiratory distress. She has no wheezes.  Abdominal: Soft. She exhibits no distension. There is no tenderness.  Musculoskeletal: She exhibits no tenderness (with palpation of LLE).  Patient has 1+ pitting edema in LLE up to the level of the knee. Patient does not exhibit RLE edema.   Skin: Skin is warm and dry. Capillary refill takes less than 2 seconds. No rash noted. No erythema.  Psychiatric:  Patient alert to person and recognizes interviewer from previous interactions. She is not oriented to place or year. Gross motor of upper and lower extremities in tact. Patient walks with assistance in exam room but mainly uses wheelchair.    Assessment & Plan:   See Encounters Tab for problem based charting.  Patient seen with Dr. Oswaldo DoneVincent.

## 2017-02-17 NOTE — Patient Instructions (Addendum)
Thank you for seeing us today!  Please continue taking two, 15 mg tablets per day for the next two weeks (total of 30 mg per day).  After this 14 day period you can take one 15 mg tablet per day.   Please return to clinic as scheduled on August 3 with Norm Parcelonna Plyler for Diabetes Education. You can follow up with your PCP in 1-3 months for management of your chronic medical conditions.

## 2017-02-20 NOTE — Progress Notes (Signed)
Internal Medicine Clinic Attending  I saw and evaluated the patient.  I personally confirmed the key portions of the history and exam documented by Dr. Nedrud and I reviewed pertinent patient test results.  The assessment, diagnosis, and plan were formulated together and I agree with the documentation in the resident's note.  

## 2017-02-24 ENCOUNTER — Ambulatory Visit: Payer: Medicare HMO | Admitting: Dietician

## 2017-02-27 ENCOUNTER — Encounter: Payer: Self-pay | Admitting: *Deleted

## 2017-03-07 ENCOUNTER — Other Ambulatory Visit: Payer: Self-pay | Admitting: Internal Medicine

## 2017-03-07 DIAGNOSIS — I82412 Acute embolism and thrombosis of left femoral vein: Secondary | ICD-10-CM

## 2017-03-09 ENCOUNTER — Telehealth: Payer: Self-pay | Admitting: Internal Medicine

## 2017-03-09 NOTE — Telephone Encounter (Signed)
[facility] CALLED [phone], ADVISED PATIENT HAS BEEN ON XARELTO 15MG  FOR 21 DAYS, NEEDS TO KNOW IF CONTINUE 15MG  OR INCREASE TO 20MG .

## 2017-03-09 NOTE — Telephone Encounter (Signed)
See Dr Rogers SeedsNedrud's office note on 02/17/17. Thanks

## 2017-03-10 NOTE — Telephone Encounter (Signed)
Fax from [facility] - requesting clarification on Xarelto. States pt has taken initial rx of 15 mg twice a day for 21 days. Is she supposed to be on 15 mg again or start 20 mg daily? Thanks

## 2017-03-13 DIAGNOSIS — S72001A Fracture of unspecified part of neck of right femur, initial encounter for closed fracture: Secondary | ICD-10-CM | POA: Diagnosis not present

## 2017-03-13 DIAGNOSIS — E1165 Type 2 diabetes mellitus with hyperglycemia: Secondary | ICD-10-CM | POA: Diagnosis not present

## 2017-03-13 NOTE — Telephone Encounter (Signed)
Patient was seen for DVT diagnosis and management with Dr. Reece Agar who was in [facility] clinic when we initially saw her. Usually xarelto maintenance dose is 20 mg daily, but he suggested doing 15mg  daily because patient is elderly and may not require higher dose, as medicine usually dosed according to kidney function which is likely decreased. Could consult pharmacy regarding dosing if concerned...will defer to what PCP decides.

## 2017-03-14 ENCOUNTER — Other Ambulatory Visit: Payer: Self-pay | Admitting: Internal Medicine

## 2017-03-14 ENCOUNTER — Telehealth: Payer: Self-pay

## 2017-03-14 NOTE — Telephone Encounter (Signed)
Please change Xarelto dose on medication list to 15 mg daily. Thanks

## 2017-03-14 NOTE — Telephone Encounter (Signed)
Byrd Hesselbach with [facility] needs to speak with a nurse about XARELTO 15 MG TABS tablet. Please call back.

## 2017-03-14 NOTE — Telephone Encounter (Signed)
Done

## 2017-03-14 NOTE — Telephone Encounter (Signed)
Talked to Dr Vincente Liberty about Xarelto direction clarification. Xarelto 15 mg - take 1 tab daily #30 x 2 refills; rx called to [facility].

## 2017-03-28 ENCOUNTER — Encounter: Payer: Medicare HMO | Admitting: Internal Medicine

## 2017-04-08 ENCOUNTER — Other Ambulatory Visit: Payer: Self-pay | Admitting: Internal Medicine

## 2017-04-13 DIAGNOSIS — E1165 Type 2 diabetes mellitus with hyperglycemia: Secondary | ICD-10-CM | POA: Diagnosis not present

## 2017-04-13 DIAGNOSIS — S72001A Fracture of unspecified part of neck of right femur, initial encounter for closed fracture: Secondary | ICD-10-CM | POA: Diagnosis not present

## 2017-04-24 ENCOUNTER — Telehealth: Payer: Self-pay | Admitting: Internal Medicine

## 2017-04-25 ENCOUNTER — Encounter: Payer: Self-pay | Admitting: Internal Medicine

## 2017-04-25 ENCOUNTER — Encounter (INDEPENDENT_AMBULATORY_CARE_PROVIDER_SITE_OTHER): Payer: Self-pay

## 2017-04-25 ENCOUNTER — Ambulatory Visit (INDEPENDENT_AMBULATORY_CARE_PROVIDER_SITE_OTHER): Payer: Medicare HMO | Admitting: Internal Medicine

## 2017-04-25 DIAGNOSIS — Z794 Long term (current) use of insulin: Secondary | ICD-10-CM | POA: Diagnosis not present

## 2017-04-25 DIAGNOSIS — J449 Chronic obstructive pulmonary disease, unspecified: Secondary | ICD-10-CM

## 2017-04-25 DIAGNOSIS — I82509 Chronic embolism and thrombosis of unspecified deep veins of unspecified lower extremity: Secondary | ICD-10-CM | POA: Diagnosis not present

## 2017-04-25 DIAGNOSIS — G309 Alzheimer's disease, unspecified: Secondary | ICD-10-CM

## 2017-04-25 DIAGNOSIS — E1165 Type 2 diabetes mellitus with hyperglycemia: Secondary | ICD-10-CM | POA: Diagnosis not present

## 2017-04-25 DIAGNOSIS — Z0000 Encounter for general adult medical examination without abnormal findings: Secondary | ICD-10-CM | POA: Insufficient documentation

## 2017-04-25 DIAGNOSIS — Z7901 Long term (current) use of anticoagulants: Secondary | ICD-10-CM

## 2017-04-25 DIAGNOSIS — F0280 Dementia in other diseases classified elsewhere without behavioral disturbance: Secondary | ICD-10-CM

## 2017-04-25 DIAGNOSIS — R69 Illness, unspecified: Secondary | ICD-10-CM | POA: Diagnosis not present

## 2017-04-25 LAB — GLUCOSE, CAPILLARY: Glucose-Capillary: 552 mg/dL (ref 65–99)

## 2017-04-25 MED ORDER — NOVOLOG MIX 70/30 (70-30) 100 UNIT/ML ~~LOC~~ SUSP: SUBCUTANEOUS | 3 refills | Status: DC

## 2017-04-25 MED ORDER — INSULIN SYRINGE-NEEDLE U-100 31G X 15/64" 0.3 ML MISC
2 refills | Status: DC
Start: 2017-04-25 — End: 2017-07-07

## 2017-04-25 NOTE — Progress Notes (Signed)
CC: T2DM  HPI:  [name] is a 83 y.o. female with PMH of Alzheimer's dementia, Insulin Dependent T2DM, COPD, and recent DVT on Xarelto who presents for management of her diabetes. She is accompanied by her daughter who provides additional history.  T2DM: Patient with long-standing poorly controlled insulin dependent type 2 diabetes. Daughter is the one giving insulin shots and checking CBGs at home if patient cooperates. Patient has refused oral medications and has limited her daughter to only allow one insulin injection per day. Daughter states that they are not following the previous plans of Lantus, Novolog SSI, and V-go. Daughter states that the V-GO did not help. Instead, she has been giving Novolog 70/30 28 units in the morning with last dose being on 04/15/17. She has been otherwise untreated for her diabetes for 10 days. She checks her CBG once a day and has been seeing most readings between 300-500 with a fasting of 405 yesterday. Patient otherwise says she feels like her normal self without any nausea, vomiting, abdominal pain, polydipsia, polyuria, lightheadedness, or shortness of breath. CBG in clinic is 552 (patient just ate banana, yogurt, and eggs per daughter). She is requesting a refill of her 70/30.  Healthcare Maintenance: Patient agreeable for flu shot today.  Past Medical History:  Diagnosis Date  . Alzheimer's dementia    "dx'd 05/2015; final stages" (06/15/2016)  . Complication of anesthesia    "problems waking up from gallbladder OR in 1978"  . COPD (chronic obstructive pulmonary disease) (HCC)   . High cholesterol   . History of blood transfusion 1996   "related to appendix OR"  . Infected surgical wound 07/2016   rt hip  . Type II diabetes mellitus (HCC) 1998   Review of Systems:   Review of Systems  Constitutional: Negative for chills, diaphoresis and fever.  Respiratory: Negative for shortness of breath.   Cardiovascular: Negative for chest pain.    Gastrointestinal: Negative for abdominal pain, nausea and vomiting.  Genitourinary: Negative for dysuria, frequency and urgency.  Neurological: Negative for dizziness and loss of consciousness.  Endo/Heme/Allergies: Negative for polydipsia.     Physical Exam:  Vitals:   04/25/17 1527  BP: 139/62  Pulse: 85  Temp: 98.6 F (37 C)  TempSrc: Oral  SpO2: 97%  Weight: 107 lb (48.5 kg)  Height: 5' (1.524 m)   Physical Exam  Constitutional: No distress.  Thin elderly woman in no distress  HENT:  Head: Normocephalic and atraumatic.  Mouth/Throat: No oropharyngeal exudate.  Poor dentition, dry oral mucosa  Cardiovascular: Normal rate and regular rhythm.   Pulmonary/Chest: Effort normal. No respiratory distress. She has no wheezes. She has no rales.  Musculoskeletal: Normal range of motion. She exhibits no edema or tenderness.  Neurological: She is alert.  Skin: Skin is warm and dry. Capillary refill takes less than 2 seconds. She is not diaphoretic.    Assessment & Plan:   See Encounters Tab for problem based charting.  Patient discussed with Dr. Rogelia Boga  Poorly controlled type 2 diabetes mellitus [facility]) Patient with long-standing poorly controlled insulin dependent type 2 diabetes who has been off her insulin for 10 days. It is clear that patient does not want to take oral medications or have multiple insulin shots per day. Daughter is limited in providing care as patient has refused CBG checks and insulin shots at home.  There is a cultural component contributing as well per the daughter as she says her mother may feel that she may be  seen as a pariah for taking medications (daughter says she takes 22 meds herself). I discussed with the patient and daughter that we should at least try maximizing the benefit of her Novolog 70/30 by taking it twice a day to try to obtain some improvement in her glycemic control and avoid the need for hospitalization. They both agree to try for now.  They will also try checking her blood sugar twice daily and follow up as scheduled with her PCP. She is not willing to try oral medications at this time. - Refill Novolog 70/30: Take 28 units with 1st large meal of the day and 22 units with last large meal of the day - Check CBGs twice daily; bring meter on follow up  Healthcare maintenance Flu shot given today.

## 2017-04-25 NOTE — Assessment & Plan Note (Signed)
Flu shot given today

## 2017-04-25 NOTE — Assessment & Plan Note (Signed)
Patient with long-standing poorly controlled insulin dependent type 2 diabetes who has been off her insulin for 10 days. It is clear that patient does not want to take oral medications or have multiple insulin shots per day. Daughter is limited in providing care as patient has refused CBG checks and insulin shots at home.  There is a cultural component contributing as well per the daughter as she says her mother may feel that she may be seen as a pariah for taking medications (daughter says she takes 20 meds herself). I discussed with the patient and daughter that we should at least try maximizing the benefit of her Novolog 70/30 by taking it twice a day to try to obtain some improvement in her glycemic control and avoid the need for hospitalization. They both agree to try for now. They will also try checking her blood sugar twice daily and follow up as scheduled with her PCP. She is not willing to try oral medications at this time. - Refill Novolog 70/30: Take 28 units with 1st large meal of the day and 22 units with last large meal of the day - Check CBGs twice daily; bring meter on follow up

## 2017-04-25 NOTE — Patient Instructions (Addendum)
It was a pleasure to meet you Ms. Oshima.  Please take the Novolog 70/30 as follows: 28 units with first large meal of the day 22 units with last large meal of the day  I have sent a refill of your Novolog and BD VEO syringes to your pharmacy.  Check your blood sugar twice a day before large meals and bring your meter with you on follow up with Dr. Evelene Croon.  You were given a flu shot today.  Follow up with Dr. Evelene Croon on 05/19/17 or see [place] sooner if needed.

## 2017-04-26 NOTE — Progress Notes (Signed)
Internal Medicine Clinic Attending  Case discussed with Dr. Patel at the time of the visit.  We reviewed the resident's history and exam and pertinent patient test results.  I agree with the assessment, diagnosis, and plan of care documented in the resident's note.  

## 2017-05-13 DIAGNOSIS — E1165 Type 2 diabetes mellitus with hyperglycemia: Secondary | ICD-10-CM | POA: Diagnosis not present

## 2017-05-13 DIAGNOSIS — S72001A Fracture of unspecified part of neck of right femur, initial encounter for closed fracture: Secondary | ICD-10-CM | POA: Diagnosis not present

## 2017-05-19 ENCOUNTER — Encounter: Payer: Self-pay | Admitting: Internal Medicine

## 2017-05-19 ENCOUNTER — Ambulatory Visit (INDEPENDENT_AMBULATORY_CARE_PROVIDER_SITE_OTHER): Payer: Medicare HMO | Admitting: Internal Medicine

## 2017-05-19 VITALS — BP 135/68 | HR 58 | Temp 99.9°F | Wt 112.0 lb

## 2017-05-19 DIAGNOSIS — Z9114 Patient's other noncompliance with medication regimen: Secondary | ICD-10-CM | POA: Diagnosis not present

## 2017-05-19 DIAGNOSIS — E119 Type 2 diabetes mellitus without complications: Secondary | ICD-10-CM | POA: Diagnosis not present

## 2017-05-19 DIAGNOSIS — N898 Other specified noninflammatory disorders of vagina: Secondary | ICD-10-CM

## 2017-05-19 DIAGNOSIS — M791 Myalgia, unspecified site: Secondary | ICD-10-CM | POA: Diagnosis not present

## 2017-05-19 DIAGNOSIS — R300 Dysuria: Secondary | ICD-10-CM | POA: Diagnosis not present

## 2017-05-19 DIAGNOSIS — F0390 Unspecified dementia without behavioral disturbance: Secondary | ICD-10-CM

## 2017-05-19 DIAGNOSIS — E1165 Type 2 diabetes mellitus with hyperglycemia: Secondary | ICD-10-CM

## 2017-05-19 DIAGNOSIS — R69 Illness, unspecified: Secondary | ICD-10-CM | POA: Diagnosis not present

## 2017-05-19 LAB — GLUCOSE, CAPILLARY: Glucose-Capillary: 528 mg/dL (ref 65–99)

## 2017-05-19 LAB — POCT URINALYSIS DIPSTICK
Bilirubin, UA: NEGATIVE
KETONES UA: NEGATIVE
NITRITE UA: POSITIVE
PROTEIN UA: NEGATIVE
Spec Grav, UA: 1.01 (ref 1.010–1.025)
Urobilinogen, UA: 0.2 E.U./dL
pH, UA: 5 (ref 5.0–8.0)

## 2017-05-19 LAB — POCT GLYCOSYLATED HEMOGLOBIN (HGB A1C): Hemoglobin A1C: 13.5

## 2017-05-19 MED ORDER — INSULIN GLARGINE 100 UNIT/ML ~~LOC~~ SOLN: 30.0000 [IU] | Freq: Every day | SUBCUTANEOUS | 5 refills | Status: DC

## 2017-05-19 MED ORDER — NITROFURANTOIN MONOHYD MACRO 100 MG PO CAPS: 100.0000 mg | ORAL_CAPSULE | Freq: Two times a day (BID) | ORAL | 0 refills | Status: AC

## 2017-05-19 NOTE — Patient Instructions (Addendum)
It was nice to meet you Sharon Butler.   Please take macrobid 100 mg for you urine infection. Take 1 tablet 2 times a day for 5 days. I sent a prescription to your pharmacy.   Please stop taking Novolog 70/30 and start taking Lantus 30units before breakfast. You were given a printed prescription for the Lantus.   Please return in 1 month. Remember to bring your blood sugar log with you.

## 2017-05-20 DIAGNOSIS — N390 Urinary tract infection, site not specified: Secondary | ICD-10-CM | POA: Insufficient documentation

## 2017-05-20 NOTE — Assessment & Plan Note (Signed)
Patient is a very poor historian due to advanced dementia. She is accompanied by daughter and son who help take care of her and administer medications. She was last seen in 9/13 at which time daughter reported that patient had not taken her insulin for the last 10 days. Per daughter, patient constantly refuses insulin injections and declines p.o. medications. Patient is supposed to be on NovoLog mix 70/30 28 units qAM and 22 units QHS but is only taking 28 units in the morning.  Her CBG today is 528 and A1c is 13.5 from 11 in 10/2016. Family did not bring BG log to review. Daughter states patient has a low carbohydrate intake at home, but later during encounter admits to letting patient eat what she wants as she is older and "she has earned the right to eat whatever she wants." Recommended switching to longer acting insulin to maximize medication benefit as patient only willing to do 1 injection a day. Patient's daughter initially resistant to this idea as she would rather patient receive short acting insulin and patient has refused Lantus in the past. She also became verbally aggressive during encounter and stated [place] law prohibits her to administer medications that her mother refuses even if she is not competent enough to make medical decisions. Once calmed down, educated family on the importance of glycemic control and how this can help prevent future hospitalizations. I also emphasized benefits of a longer acting insulin. Family agreeable to try Lantus. However, I question if they will truly administer medication at home given initial resistant. Daughter agreeable to follow up in 1 month.   T2DM: insulin dependent, chronic and uncontrolled due to medication non-adherence  - Stop Novolog mix  - Start Lantus 30 units before breakfast  - Foot exam performed today  - Follow up in 1 month  - Asked daughter to bring BG log to next visit

## 2017-05-20 NOTE — Assessment & Plan Note (Signed)
Patient is a very poor historian due to advanced dementia and unable to provide a history. Daughter states patient has been complaining of vaginal itching at home and is concerned patient might have a UTI.  Denies fevers or chills at home.  Patient denies urinary symptoms. T 99.9 today. Explained to daughter concern for vaginal yeast infection in the setting of uncontrolled diabetes, but daughter states patient uses Monistat at home and will like patient to be treated with antibiotics for UTI. Daughter declined vaginal swab for wet prep. UA ordered and showed trace leukocytes and nitrites. Will treat for UTI, but advised daughter to watch for vaginal discharge and monitor for ongoing symptoms after antimicrobial therapy.   - Macrobid 100 mg BID x5 days (daughter declined Bactrim due to pill size)

## 2017-05-20 NOTE — Progress Notes (Signed)
   CC: T2DM follow up and vaginal itching   HPI:  [name] is a 83 y.o. female with past medical history as described below who presents to clinic for T2DM follow up and vaginal itching.  Please see problem based assessment and plan for further details.  Past Medical History:  Diagnosis Date  . Alzheimer's dementia    "dx'd 05/2015; final stages" (06/15/2016)  . Complication of anesthesia    "problems waking up from gallbladder OR in 1978"  . COPD (chronic obstructive pulmonary disease) (HCC)   . High cholesterol   . History of blood transfusion 1996   "related to appendix OR"  . Infected surgical wound 07/2016   rt hip  . Type II diabetes mellitus (HCC) 1998   Review of Systems:   Review of Systems  Constitutional: Negative for chills, diaphoresis, fever, malaise/fatigue and weight loss.  Respiratory: Negative for cough and shortness of breath.   Cardiovascular: Negative for chest pain, palpitations and leg swelling.  Gastrointestinal: Negative for abdominal pain, constipation, diarrhea, nausea and vomiting.  Genitourinary: Negative for dysuria, flank pain, frequency, hematuria and urgency.       Vaginal itching  Musculoskeletal: Positive for myalgias.  Neurological: Negative for dizziness, focal weakness and headaches.  All other systems reviewed and are negative.   Physical Exam:  Vitals:   05/19/17 1441  BP: 135/68  Pulse: (!) 58  Temp: 99.9 F (37.7 C)  TempSrc: Oral  SpO2: 100%  Weight: 112 lb (50.8 kg)   General: Very pleasant female, thin, well-developed, in no acute distress Cardiac: regular rate and rhythm, nl S1/S2, no murmurs, rubs or gallops  Pulm: CTAB, no wheezes or crackles, no increased work of breathing  Abd: soft, NTND, bowel sounds present Neuro: Alert and oriented to self and place, able to move all 4 extremities with no focal deficits noted Ext: warm and well perfused, no peripheral edema, diminished DP pulses bilaterally     Assessment & Plan:   See Encounters Tab for problem based charting.  Patient seen with Dr. Heide SparkNarendra

## 2017-05-21 LAB — URINE CULTURE

## 2017-05-28 NOTE — Progress Notes (Signed)
Internal Medicine Clinic Attending  I saw and evaluated the patient.  I personally confirmed the key portions of the history and exam documented by Dr. Santos-Sanchez and I reviewed pertinent patient test results.  The assessment, diagnosis, and plan were formulated together and I agree with the documentation in the resident's note. 

## 2017-06-13 DIAGNOSIS — E1165 Type 2 diabetes mellitus with hyperglycemia: Secondary | ICD-10-CM | POA: Diagnosis not present

## 2017-06-13 DIAGNOSIS — S72001A Fracture of unspecified part of neck of right femur, initial encounter for closed fracture: Secondary | ICD-10-CM | POA: Diagnosis not present

## 2017-06-19 ENCOUNTER — Other Ambulatory Visit: Payer: Self-pay | Admitting: Internal Medicine

## 2017-06-19 DIAGNOSIS — I82412 Acute embolism and thrombosis of left femoral vein: Secondary | ICD-10-CM

## 2017-07-07 ENCOUNTER — Ambulatory Visit (INDEPENDENT_AMBULATORY_CARE_PROVIDER_SITE_OTHER): Payer: Medicare HMO | Admitting: Internal Medicine

## 2017-07-07 ENCOUNTER — Encounter: Payer: Self-pay | Admitting: Internal Medicine

## 2017-07-07 VITALS — BP 154/56 | HR 58 | Temp 99.0°F | Wt 115.5 lb

## 2017-07-07 DIAGNOSIS — Z87440 Personal history of urinary (tract) infections: Secondary | ICD-10-CM

## 2017-07-07 DIAGNOSIS — Z794 Long term (current) use of insulin: Secondary | ICD-10-CM

## 2017-07-07 DIAGNOSIS — N390 Urinary tract infection, site not specified: Secondary | ICD-10-CM | POA: Diagnosis not present

## 2017-07-07 DIAGNOSIS — R300 Dysuria: Secondary | ICD-10-CM

## 2017-07-07 DIAGNOSIS — Z833 Family history of diabetes mellitus: Secondary | ICD-10-CM | POA: Diagnosis not present

## 2017-07-07 DIAGNOSIS — E1165 Type 2 diabetes mellitus with hyperglycemia: Secondary | ICD-10-CM

## 2017-07-07 DIAGNOSIS — E119 Type 2 diabetes mellitus without complications: Secondary | ICD-10-CM | POA: Diagnosis not present

## 2017-07-07 LAB — POCT URINALYSIS DIPSTICK
BILIRUBIN UA: NEGATIVE
Ketones, UA: NEGATIVE
Leukocytes, UA: NEGATIVE
NITRITE UA: POSITIVE
PH UA: 6 (ref 5.0–8.0)
PROTEIN UA: NEGATIVE
Spec Grav, UA: 1.015 (ref 1.010–1.025)
UROBILINOGEN UA: 0.2 U/dL

## 2017-07-07 MED ORDER — INSULIN PEN NEEDLE 31G X 6 MM MISC: 3 refills | Status: AC

## 2017-07-07 MED ORDER — SULFAMETHOXAZOLE-TRIMETHOPRIM 200-40 MG/5ML PO SUSP: 20.0000 mL | Freq: Two times a day (BID) | ORAL | 0 refills | Status: DC

## 2017-07-07 MED ORDER — "INSULIN SYRINGE-NEEDLE U-100 31G X 15/64"" 0.3 ML MISC": 2 refills | Status: DC

## 2017-07-07 NOTE — Assessment & Plan Note (Addendum)
Per daughter, patient with decreased urine output and urine with foul smelling odor. Patient denies urinary symptoms, but daughter states has been complaining of dysuria for the past week. She was treated with 5 days of Macrobid on 10/27 for UTI. UA at the time was positive for nitrites. Today, UA shows trace blood and nitrites. Will prescribe Bactrim x3 days. Suspension form ordered as patient has difficulty swallowing pills.   - Bactrim suspension x3 days sent  - BMP to check renal function given report of decreased urine output (no hx of CKD)

## 2017-07-07 NOTE — Progress Notes (Signed)
   CC: DM follow up   HPI:  [name] is a 83 y.o. female with PMH listed below who presents to clinic for DM follow up. Please problem based assessment and plan for further details.   Past Medical History:  Diagnosis Date  . Alzheimer's dementia    "dx'd 05/2015; final stages" (06/15/2016)  . Complication of anesthesia    "problems waking up from gallbladder OR in 1978"  . COPD (chronic obstructive pulmonary disease) (HCC)   . High cholesterol   . History of blood transfusion 1996   "related to appendix OR"  . Infected surgical wound 07/2016   rt hip  . Type II diabetes mellitus (HCC) 1998   Review of Systems:   Review of Systems  Constitutional: Negative for chills, fever, malaise/fatigue and weight loss.  Respiratory: Negative for cough, sputum production and shortness of breath.   Cardiovascular: Negative for chest pain and palpitations.  Genitourinary: Negative for dysuria, frequency and urgency.  All other systems reviewed and are negative.   Physical Exam:  Vitals:   07/07/17 1607  BP: (!) 154/56  Pulse: (!) 58  Temp: 99 F (37.2 C)  TempSrc: Oral  SpO2: 100%  Weight: 115 lb 8 oz (52.4 kg)   General: chronically ill appearing, thin, well-developed, in no acute distress  Cardiac: RRR, nl S1/S2, no murmurs, rubs or gallops  Pulm: CTAB, no crackles or wheezes, no increased work of breathing noted  Abd: soft, NTND, normoactive BS  Ext: warm and well perfused, no cyanosis or edema. There is a bruise on R 5th toe but no wound noted.    Assessment & Plan:   See Encounters Tab for problem based charting.  Patient discussed with Dr. Cyndie ChimeGranfortuna

## 2017-07-07 NOTE — Patient Instructions (Addendum)
It was nice to see you today. Your blood sugar is a lot better!! Please increase Lantus to 35 units. I will see you back in 2 months to check on your DM.    You have a urinary tract infection. I will send a prescription for Bactrim to your pharmacy.

## 2017-07-07 NOTE — Assessment & Plan Note (Addendum)
Patient presents for DM follow up. She was last seen on 10/27 and switched from Novolog 70/30 28AM/22PM to Lantus 30 qAm to maximize insulin benefit as she was only taking Novolog in the morning. CBG today 337. Per daughter, patient seems more energetic at home and her appetite has improved. Overall, has been doing very well since last seen on 10/27. Did not bring BG meter, but family reports BG usually in the 200s since starting Lantus. Lowest BG 190. No symptoms of hypoglycemia.    - Increase Lantus 30 --> 35U in AM - Follow up in 2 months   - Consider adding glipizide in the future if uncontrolled hyperglycemia remains

## 2017-07-08 LAB — URINALYSIS, ROUTINE W REFLEX MICROSCOPIC
Bilirubin, UA: NEGATIVE
Ketones, UA: NEGATIVE
LEUKOCYTES UA: NEGATIVE
Nitrite, UA: POSITIVE — AB
PH UA: 6 (ref 5.0–7.5)
PROTEIN UA: NEGATIVE
Specific Gravity, UA: >=1.03 — AB (ref 1.005–1.030)
UUROB: 0.2 mg/dL (ref 0.2–1.0)

## 2017-07-08 LAB — MICROSCOPIC EXAMINATION: CASTS: NONE SEEN /LPF

## 2017-07-08 LAB — BMP8+ANION GAP
Anion Gap: 12 mmol/L (ref 10.0–18.0)
BUN/Creatinine Ratio: 26 (ref 12–28)
BUN: 21 mg/dL (ref 8–27)
CO2: 26 mmol/L (ref 20–29)
Calcium: 9.4 mg/dL (ref 8.7–10.3)
Chloride: 101 mmol/L (ref 96–106)
Creatinine, Ser: 0.81 mg/dL (ref 0.57–1.00)
GFR calc Af Amer: 76 mL/min/{1.73_m2} (ref >59)
GFR calc non Af Amer: 66 mL/min/{1.73_m2} (ref >59)
Glucose: 314 mg/dL — ABNORMAL HIGH (ref 65–99)
Potassium: 4.4 mmol/L (ref 3.5–5.2)
Sodium: 139 mmol/L (ref 134–144)

## 2017-07-10 LAB — GLUCOSE, CAPILLARY: Glucose-Capillary: 337 mg/dL — ABNORMAL HIGH (ref 65–99)

## 2017-07-10 NOTE — Progress Notes (Signed)
Medicine attending: I personally interviewed and briefly examined this patient on the day of the patient visit and reviewed pertinent clinical ,laboratorydata  with resident physician Dr. Lovenia KimSantos-Sanchez and we discussed a management plan. Nice lady. Daughter here - also a diabetic. We are trying to simplify her regimen due to her dislike of taking multiple injections daily. Now down to single dose lantus. We would like to add an oral agent. Glucophage too large a pill for her to swallow. Consider adding glipizide at time of next visit if HbA1c rising. Rx oral septra suspension for recurrent UTI.

## 2017-07-21 ENCOUNTER — Other Ambulatory Visit: Payer: Self-pay | Admitting: *Deleted

## 2017-07-21 DIAGNOSIS — E1165 Type 2 diabetes mellitus with hyperglycemia: Secondary | ICD-10-CM

## 2017-07-21 MED ORDER — ACCU-CHEK GUIDE VI STRP: ORAL_STRIP | 10 refills | Status: DC

## 2017-09-04 ENCOUNTER — Ambulatory Visit: Payer: Medicare HMO

## 2017-09-05 ENCOUNTER — Encounter (INDEPENDENT_AMBULATORY_CARE_PROVIDER_SITE_OTHER): Payer: Self-pay

## 2017-09-05 ENCOUNTER — Encounter: Payer: Self-pay | Admitting: Internal Medicine

## 2017-09-05 ENCOUNTER — Ambulatory Visit (INDEPENDENT_AMBULATORY_CARE_PROVIDER_SITE_OTHER): Payer: Medicare HMO | Admitting: Internal Medicine

## 2017-09-05 VITALS — BP 153/45 | HR 56 | Temp 98.0°F

## 2017-09-05 DIAGNOSIS — N3000 Acute cystitis without hematuria: Secondary | ICD-10-CM

## 2017-09-05 DIAGNOSIS — R69 Illness, unspecified: Secondary | ICD-10-CM | POA: Diagnosis not present

## 2017-09-05 DIAGNOSIS — Z87440 Personal history of urinary (tract) infections: Secondary | ICD-10-CM

## 2017-09-05 DIAGNOSIS — F0390 Unspecified dementia without behavioral disturbance: Secondary | ICD-10-CM | POA: Diagnosis not present

## 2017-09-05 DIAGNOSIS — Z880 Allergy status to penicillin: Secondary | ICD-10-CM

## 2017-09-05 DIAGNOSIS — R300 Dysuria: Secondary | ICD-10-CM | POA: Diagnosis not present

## 2017-09-05 LAB — POCT URINALYSIS DIPSTICK
Bilirubin, UA: NEGATIVE
Glucose, UA: 500
Ketones, UA: NEGATIVE
Nitrite, UA: POSITIVE
Protein, UA: NEGATIVE
Spec Grav, UA: 1.02
Urobilinogen, UA: 0.2 U/dL
pH, UA: 5.5

## 2017-09-05 MED ORDER — NITROFURANTOIN MONOHYD MACRO 100 MG PO CAPS: 100.0000 mg | ORAL_CAPSULE | Freq: Two times a day (BID) | ORAL | 0 refills | Status: AC

## 2017-09-05 NOTE — Progress Notes (Signed)
   CC: Decreased urination   HPI:  [name] is a 84 y.o. female with PMH listed below who presents to clinic with decreased urination. Please see problem based assessment and plan for further details.   Past Medical History:  Diagnosis Date  . Alzheimer's dementia    "dx'd 05/2015; final stages" (06/15/2016)  . Complication of anesthesia    "problems waking up from gallbladder OR in 1978"  . COPD (chronic obstructive pulmonary disease) (HCC)   . High cholesterol   . History of blood transfusion 1996   "related to appendix OR"  . Infected surgical wound 07/2016   rt hip  . Type II diabetes mellitus (HCC) 1998   Review of Systems:   Review of Systems  Constitutional: Negative for chills, fever and malaise/fatigue.       Change in behavior per caretaker   Gastrointestinal: Negative for abdominal pain, nausea and vomiting.  Genitourinary: Negative for dysuria, flank pain, frequency, hematuria and urgency.    Physical Exam:  Vitals:   09/05/17 1614  BP: (!) 153/45  Pulse: (!) 56  Temp: 98 F (36.7 C)  TempSrc: Oral  SpO2: 99%   General: very pleasant female, thin, well-developed, in no acute distress, unable to provide history due to advanced dementia  Cardiac: regular rate and rhythm, nl S1/S2, no murmurs, rubs or gallops  Pulm: CTAB, no wheezes or crackles, no increased work of breathing  Abd: soft, NTND, bowel sounds present, no suprapubic tenderness present, no flank pain  Ext: warm and well perfused, no peripheral edema     Assessment & Plan:   See Encounters Tab for problem based charting.  Patient discussed with Dr. Rogelia BogaButcher

## 2017-09-05 NOTE — Patient Instructions (Signed)
Sharon Butler,   I sent a prescription for Macrobid to your pharmacy. Please take 100 mg (1 tablet) twice a day for the next 5 days.   I will call you with the results of your urine tests.   You have a follow up appointment with me (Dr. Evelene CroonSantos) on 2/27.   Please call if you have any questions.

## 2017-09-05 NOTE — Assessment & Plan Note (Signed)
Patient presents with daughter who is caretaker and is concerned for recurrent UTI.  Per daughter, patient has not been behaving like herself for the past 3-4 days.  She has also noticed foul-smelling odor to the urine and decreased urine volume, which is how patient's UTIs have presented in the past. Daughter state she was in the hospital for a few days during which her father acted as patient's caretaker. When she arrived home from the hospital she found the patient unkempt and soiled which she believes is the cause for patient's urinary symptoms. Per daughter, patient has not had fever, chills, flank or abdominal pain, and N/V. She is unsure if patient is having vaginal discharge and patient is unable to provide history due to advanced dementia.  Exam is unremarkable.  Urine dipstick with trace leukocytes and positive for nitrites.  Will therefore treat for acute uncomplicated cystitis.  She was previously treated with Macrobid 5 months ago and Bactrim 2 months ago.  Will order another course of Macrobid as patient has a allergy to penicillins causing anaphylaxis.  I also discussed with daughter UTI prophylaxis with vaginal estrogen cream as patient has had 3 episodes of UTIs in the past 5 months.  However, daughter declined at this time as vaginal insertion would be extremely difficult.   - Macrobid 100 mg BID x 5 days  - Follow up UA with reflex

## 2017-09-06 LAB — URINALYSIS, ROUTINE W REFLEX MICROSCOPIC
Bilirubin, UA: NEGATIVE
Ketones, UA: NEGATIVE
LEUKOCYTES UA: NEGATIVE
Nitrite, UA: NEGATIVE
Protein, UA: NEGATIVE
RBC UA: NEGATIVE
Specific Gravity, UA: >=1.03 — AB (ref 1.005–1.030)
Urobilinogen, Ur: 0.2 mg/dL (ref 0.2–1.0)
pH, UA: 5 (ref 5.0–7.5)

## 2017-09-06 NOTE — Progress Notes (Signed)
Internal Medicine Clinic Attending  Case discussed with Dr. Santos at the time of the visit.  We reviewed the resident's history and exam and pertinent patient test results.  I agree with the assessment, diagnosis, and plan of care documented in the resident's note.    

## 2017-09-07 ENCOUNTER — Telehealth: Payer: Self-pay | Admitting: *Deleted

## 2017-09-07 ENCOUNTER — Encounter: Payer: Self-pay | Admitting: Internal Medicine

## 2017-09-07 NOTE — Telephone Encounter (Signed)
Fax from [facility] - Lantus is not covered by pt's insurance; prefers Levemir. Please send new rx. Thanks

## 2017-09-08 MED ORDER — INSULIN DETEMIR 100 UNIT/ML ~~LOC~~ SOLN
30.0000 [IU] | Freq: Every day | SUBCUTANEOUS | 3 refills | Status: DC
Start: 2017-09-08 — End: 2017-12-05

## 2017-09-08 NOTE — Telephone Encounter (Signed)
I will send a new prescription for Levemir. Could you call patient and let her know? I tried calling her today and no answer. Thank you!

## 2017-09-08 NOTE — Addendum Note (Signed)
Addended by: Burna CashSANTOS-SANCHEZ, Oliver Neuwirth on: 09/08/2017 05:18 PM   Modules accepted: Orders

## 2017-09-11 NOTE — Telephone Encounter (Signed)
No answer

## 2017-09-12 NOTE — Telephone Encounter (Signed)
Does the pt have insulin or does a new Rx need to be sent and if so, for which insulin?

## 2017-09-12 NOTE — Telephone Encounter (Signed)
Rx for Levemir 30 units has been sent per Dr Lovenia KimSantos-Sanchez. Just letting her know pt has been taking 35 unit of Lantus- daughter had mentioned.

## 2017-09-12 NOTE — Telephone Encounter (Signed)
Talked to pt's daughter Gavin PoundDeborah - change Lantus to Levemir 30 units, stated her motheris on Lantus 35 units.

## 2017-09-17 ENCOUNTER — Other Ambulatory Visit: Payer: Self-pay | Admitting: Internal Medicine

## 2017-09-17 DIAGNOSIS — I82412 Acute embolism and thrombosis of left femoral vein: Secondary | ICD-10-CM

## 2017-09-20 ENCOUNTER — Encounter: Payer: Self-pay | Admitting: Internal Medicine

## 2017-09-20 ENCOUNTER — Ambulatory Visit (INDEPENDENT_AMBULATORY_CARE_PROVIDER_SITE_OTHER): Payer: Medicare HMO | Admitting: Internal Medicine

## 2017-09-20 ENCOUNTER — Other Ambulatory Visit: Payer: Self-pay

## 2017-09-20 VITALS — BP 154/52 | HR 50 | Temp 97.9°F | Ht 61.0 in | Wt 117.0 lb

## 2017-09-20 DIAGNOSIS — E118 Type 2 diabetes mellitus with unspecified complications: Secondary | ICD-10-CM

## 2017-09-20 DIAGNOSIS — E1165 Type 2 diabetes mellitus with hyperglycemia: Secondary | ICD-10-CM

## 2017-09-20 DIAGNOSIS — Z794 Long term (current) use of insulin: Secondary | ICD-10-CM

## 2017-09-20 LAB — GLUCOSE, CAPILLARY: Glucose-Capillary: 353 mg/dL — ABNORMAL HIGH (ref 65–99)

## 2017-09-20 LAB — POCT GLYCOSYLATED HEMOGLOBIN (HGB A1C): HEMOGLOBIN A1C: 10.5

## 2017-09-20 NOTE — Patient Instructions (Signed)
[name],   Continue taking Levemir 25 units like you have been doing.   I will see you back ni 3 months for diabetes follow up. You can come back sooner if needed.   Please call me if you have any questions.

## 2017-09-21 ENCOUNTER — Encounter: Payer: Self-pay | Admitting: Internal Medicine

## 2017-09-21 NOTE — Assessment & Plan Note (Signed)
Patient presents for diabetes follow-up.  She is accompanied by her daughter and husband who are her caretakers.  She was previously on Lantus 30 daily which was recently switched to Levemir 30 daily due to insurance coverage issues.  Per daughter, she has been receiving Levemir 35 units daily.  Review of BG meter reveal CBGs between 140-280, much improved from 6 months ago (400-500s). Last A1c 10.5 improved from 13.5 6 months ago.   - Continue Levemir 35U daily  - Follow up in 3 months

## 2017-09-21 NOTE — Progress Notes (Signed)
    CC: Diabetes follow-up  HPI:  Sharon Butler is a 84 y.o. female with PMH listed below who presents to clinic for diabetes follow-up.  Please see problem based assessment and plan for further details.  Past Medical History:  Diagnosis Date  . Alzheimer's dementia    "dx'd 05/2015; final stages" (06/15/2016)  . Complication of anesthesia    "problems waking up from gallbladder OR in 1978"  . COPD (chronic obstructive pulmonary disease) (HCC)   . High cholesterol   . History of blood transfusion 1996   "related to appendix OR"  . Infected surgical wound 07/2016   rt hip  . Type II diabetes mellitus (HCC) 1998   Review of Systems:   Review of Systems  Constitutional: Negative for chills, fever and malaise/fatigue.  Respiratory: Negative for cough and shortness of breath.   Cardiovascular: Negative for chest pain, palpitations and leg swelling.  Gastrointestinal: Negative for abdominal pain, nausea and vomiting.  Genitourinary: Negative for dysuria, frequency and urgency.    Physical Exam:  Vitals:   09/20/17 1609 09/20/17 1611  BP: (!) 154/52   Pulse: (!) 50   Temp: 97.9 F (36.6 C)   TempSrc: Oral   SpO2: 100% 100%  Weight: 117 lb (53.1 kg)   Height: 5\' 1"  (1.549 m)    General: Pleasant female, well-nourished, well-developed, in no acute distress Cardiac: regular rate and rhythm, nl S1/S2, no murmurs, rubs or gallops  Pulm: CTAB, no wheezes or crackles, no increased work of breathing  Abd: soft, NTND, bowel sounds present Ext: warm and well perfused, no peripheral edema   Assessment & Plan:   See Encounters Tab for problem based charting.  Patient discussed with Dr. Cyndie ChimeGranfortuna

## 2017-09-23 NOTE — Progress Notes (Signed)
Medicine attending: Medical history, presenting problems, physical findings, and medications, reviewed with resident physician Dr Santos-Sanchez on the day of the patient visit and I concur with her evaluation and management plan. 

## 2017-10-03 ENCOUNTER — Encounter: Payer: Self-pay | Admitting: Internal Medicine

## 2017-10-03 ENCOUNTER — Ambulatory Visit (INDEPENDENT_AMBULATORY_CARE_PROVIDER_SITE_OTHER): Payer: Medicare HMO | Admitting: Internal Medicine

## 2017-10-03 ENCOUNTER — Ambulatory Visit: Payer: Medicare HMO

## 2017-10-03 VITALS — BP 147/48 | HR 53 | Temp 98.4°F | Wt 119.0 lb

## 2017-10-03 DIAGNOSIS — Z7901 Long term (current) use of anticoagulants: Secondary | ICD-10-CM

## 2017-10-03 DIAGNOSIS — R17 Unspecified jaundice: Secondary | ICD-10-CM

## 2017-10-03 DIAGNOSIS — Z6822 Body mass index (BMI) 22.0-22.9, adult: Secondary | ICD-10-CM

## 2017-10-03 DIAGNOSIS — R630 Anorexia: Secondary | ICD-10-CM | POA: Diagnosis not present

## 2017-10-03 DIAGNOSIS — Z86718 Personal history of other venous thrombosis and embolism: Secondary | ICD-10-CM

## 2017-10-03 NOTE — Assessment & Plan Note (Signed)
There are some skin and eye color changes noted by daughter who seems very concerned.  My exam was normal, I did not appreciate any scleral icterus or yellowish tinge to skin might be some changes from her baseline. There is no weight loss. No other red flag symptoms except mildly decreased appetite.  -We will check CBC, CMP and fractionated bilirubin. -We will decide about further testing depending on these lab results.

## 2017-10-03 NOTE — Progress Notes (Signed)
   CC: Yellow discoloration of eyes and skin for 1 week.  HPI:  Sharon Butler is a 84 y.o. with past medical history as listed below came to the clinic with her daughter with a concern about some yellowish discoloration of her eyes and skin for 1 week. Patient was also complaining of decreased appetite for 1 week, denies any nausea, vomiting, diarrhea, constipation, abdominal pain, sick contacts, fever or chills. She denies any bleeding in her urine or stool. She denies any recent weight loss.  Please see assessment and plan for her chronic conditions.  Past Medical History:  Diagnosis Date  . Alzheimer's dementia    "dx'd 05/2015; final stages" (06/15/2016)  . Complication of anesthesia    "problems waking up from gallbladder OR in 1978"  . COPD (chronic obstructive pulmonary disease) (HCC)   . High cholesterol   . History of blood transfusion 1996   "related to appendix OR"  . Infected surgical wound 07/2016   rt hip  . Type II diabetes mellitus (HCC) 1998   Review of Systems: Negative except mentioned in HPI.  Physical Exam:  Vitals:   10/03/17 1613  BP: (!) 147/48  Pulse: (!) 53  Temp: 98.4 F (36.9 C)  TempSrc: Oral  SpO2: 98%  Weight: 119 lb (54 kg)    General: Vital signs reviewed.  Patient is well-developed and well-nourished, in no acute distress and cooperative with exam.  Head: Normocephalic and atraumatic. Eyes: EOMI, conjunctivae normal, no scleral icterus.  Cardiovascular: RRR, S1 normal, S2 normal, no murmurs, gallops, or rubs. Pulmonary/Chest: Clear to auscultation bilaterally, no wheezes, rales, or rhonchi. Abdominal: Soft, non-tender, non-distended, BS +, no masses, organomegaly, or guarding present.  Extremities: No lower extremity edema bilaterally,  pulses symmetric and intact bilaterally. No cyanosis or clubbing. Skin: Warm, dry and intact. No rashes or erythema.  Prominent veins, no abnormal bruising. Psychiatric: Normal mood and affect.  speech and behavior is normal. Cognition and memory are normal.  Assessment & Plan:   See Encounters Tab for problem based charting.  Patient discussed with Dr. Sandre Kittyaines.

## 2017-10-03 NOTE — Assessment & Plan Note (Signed)
Patient is compliant with Xarelto 15 mg daily. Denies any sign of bleeding.

## 2017-10-03 NOTE — Patient Instructions (Addendum)
Thank you for visiting clinic today. We will check some lab work today-we will call you with any abnormal results. Please follow-up in clinic if your symptoms started to get worse.

## 2017-10-04 LAB — CMP14 + ANION GAP
ALT: 16 IU/L (ref 0–32)
AST: 15 IU/L (ref 0–40)
Albumin/Globulin Ratio: 1.3 (ref 1.2–2.2)
Albumin: 3.8 g/dL (ref 3.5–4.7)
Alkaline Phosphatase: 113 IU/L (ref 39–117)
Anion Gap: 16 mmol/L (ref 10.0–18.0)
BUN/Creatinine Ratio: 22 (ref 12–28)
BUN: 23 mg/dL (ref 8–27)
Bilirubin Total: 0.3 mg/dL (ref 0.0–1.2)
CALCIUM: 9.3 mg/dL (ref 8.7–10.3)
CO2: 20 mmol/L (ref 20–29)
Chloride: 101 mmol/L (ref 96–106)
Creatinine, Ser: 1.04 mg/dL — ABNORMAL HIGH (ref 0.57–1.00)
GFR, EST AFRICAN AMERICAN: 56 mL/min/{1.73_m2} — AB (ref >59)
GFR, EST NON AFRICAN AMERICAN: 49 mL/min/{1.73_m2} — AB (ref >59)
GLUCOSE: 388 mg/dL — AB (ref 65–99)
Globulin, Total: 2.9 g/dL (ref 1.5–4.5)
Potassium: 4.7 mmol/L (ref 3.5–5.2)
Sodium: 137 mmol/L (ref 134–144)
TOTAL PROTEIN: 6.7 g/dL (ref 6.0–8.5)

## 2017-10-04 LAB — BILIRUBIN, FRACTIONATED(TOT/DIR/INDIR)
BILIRUBIN INDIRECT: 0.2 mg/dL (ref 0.10–0.80)
Bilirubin, Direct: 0.1 mg/dL (ref 0.00–0.40)

## 2017-10-04 LAB — CBC WITH DIFFERENTIAL/PLATELET
BASOS ABS: 0 10*3/uL (ref 0.0–0.2)
Basos: 0 %
EOS (ABSOLUTE): 0.1 10*3/uL (ref 0.0–0.4)
Eos: 1 %
Hematocrit: 43.2 % (ref 34.0–46.6)
Hemoglobin: 14.7 g/dL (ref 11.1–15.9)
IMMATURE GRANS (ABS): 0 10*3/uL (ref 0.0–0.1)
IMMATURE GRANULOCYTES: 0 %
LYMPHS: 22 %
Lymphocytes Absolute: 1.8 10*3/uL (ref 0.7–3.1)
MCH: 30.8 pg (ref 26.6–33.0)
MCHC: 34 g/dL (ref 31.5–35.7)
MCV: 91 fL (ref 79–97)
MONOCYTES: 8 %
Monocytes Absolute: 0.7 10*3/uL (ref 0.1–0.9)
Neutrophils Absolute: 5.7 10*3/uL (ref 1.4–7.0)
Neutrophils: 69 %
Platelets: 183 10*3/uL (ref 150–379)
RBC: 4.77 x10E6/uL (ref 3.77–5.28)
RDW: 13.9 % (ref 12.3–15.4)
WBC: 8.3 10*3/uL (ref 3.4–10.8)

## 2017-10-05 NOTE — Progress Notes (Signed)
Internal Medicine Clinic Attending  Case discussed with Dr. Nelson ChimesAmin  at the time of the visit.  We reviewed the resident's history and exam and pertinent patient test results.  I agree with the assessment, diagnosis, and plan of care documented in the resident's note.  Bilirubin is normal, but Cr is slightly elevated, will need follow up on this and poor appetite.  Anne ShutterAlexander N Raines, MD

## 2017-11-03 ENCOUNTER — Other Ambulatory Visit: Payer: Self-pay | Admitting: Pharmacist

## 2017-11-03 DIAGNOSIS — Z86718 Personal history of other venous thrombosis and embolism: Secondary | ICD-10-CM

## 2017-11-03 DIAGNOSIS — R17 Unspecified jaundice: Secondary | ICD-10-CM

## 2017-11-03 MED ORDER — RIVAROXABAN 10 MG PO TABS: 10.0000 mg | ORAL_TABLET | Freq: Every day | ORAL | 11 refills | Status: DC

## 2017-11-03 NOTE — Progress Notes (Signed)
Patient switched from Xarelto 15 mg to 10 mg  unprovoked DVT on 01/2017. Due to her age and >6 months since the event, PCP approved reducing her dose to Xarelto 10 mg. The FDA approved low-dose Xarelto 10 mg for extended anticoagulation after 6 months of treatment.   References: FDA, EINSTEIN-CHOICE study, Beer's Criteria    Patient was notified of change and prescription sent

## 2017-12-04 ENCOUNTER — Telehealth: Payer: Self-pay

## 2017-12-04 NOTE — Telephone Encounter (Signed)
Thank you. I agree with [facility] appt

## 2017-12-04 NOTE — Telephone Encounter (Signed)
Requesting to speak with a nurse about insulin. Please call back.  

## 2017-12-04 NOTE — Telephone Encounter (Signed)
Spoke with daughter who is concerned levemir is not working as well lately to lower glucose. Has been giving patient 35 units levemir daily. States she was told by PCP to give 35 units (30 units) written on med list. Not taking Novolog Mix (70-30). BS ranging 203-310. Was 486 on 12/01/2017 AM day after celebrating husband's birthday with cake-no icing. Daughter tries to limit patient's carbs/starches. Patient already has first available appt with PCP on 01/05/2018. Scheduled appt tomorrow in [facility] to discuss elevated glucose. Daughter will bring glucometer for download. Kinnie Feil, RN, BSN

## 2017-12-05 ENCOUNTER — Ambulatory Visit (INDEPENDENT_AMBULATORY_CARE_PROVIDER_SITE_OTHER): Payer: Medicare HMO | Admitting: Internal Medicine

## 2017-12-05 VITALS — BP 119/52 | HR 58 | Temp 99.4°F | Wt 119.6 lb

## 2017-12-05 DIAGNOSIS — E1165 Type 2 diabetes mellitus with hyperglycemia: Secondary | ICD-10-CM

## 2017-12-05 DIAGNOSIS — Z794 Long term (current) use of insulin: Secondary | ICD-10-CM | POA: Diagnosis not present

## 2017-12-05 LAB — POCT GLYCOSYLATED HEMOGLOBIN (HGB A1C): Hemoglobin A1C: 10.8

## 2017-12-05 LAB — GLUCOSE, CAPILLARY: Glucose-Capillary: 448 mg/dL — ABNORMAL HIGH (ref 65–99)

## 2017-12-05 MED ORDER — INSULIN DEGLUDEC-LIRAGLUTIDE 100-3.6 UNIT-MG/ML ~~LOC~~ SOPN: 16.0000 [IU] | PEN_INJECTOR | Freq: Every day | SUBCUTANEOUS | Status: DC

## 2017-12-05 NOTE — Patient Instructions (Signed)
We will try a medicine called xultophy at 16 units for your mother's diabetes.  You can adjust according to the instructions you were given increasing in increments of 2 units every 3 days if needed for the goal we discussed of around 150-200.  Please bring a log of her blood sugars back so we can further adjust.  Please bring a log with fasting blood sugars in addition not every day but every few days get a measurement two hours after a meal and another reading before bedtime.

## 2017-12-05 NOTE — Progress Notes (Signed)
CC: hyperglycemia  HPI:  [name] is a 84 y.o. female with dementia.  She reports feeling well, is happy without complaints. She comes in with hyperglycemia.  Her daughter is her care taker and is very concerned about her diabetes.  Additionally, as a diabetic herself she knows just enough about it to try and be controversial when coming up with strategies for glucose management.    Please see A&P for status of the patient's chronic medical conditions  Past Medical History:  Diagnosis Date  . Alzheimer's dementia    "dx'd 05/2015; final stages" (06/15/2016)  . Complication of anesthesia    "problems waking up from gallbladder OR in 1978"  . COPD (chronic obstructive pulmonary disease) (HCC)   . High cholesterol   . History of blood transfusion 1996   "related to appendix OR"  . Infected surgical wound 07/2016   rt hip  . Type II diabetes mellitus (HCC) 1998   Review of Systems:  ROS: Pulmonary: pt denies increased work of breathing, shortness of breath,  Cardiac: pt denies palpitations, chest pain,  Abdominal: pt denies abdominal pain, nausea, vomiting, or diarrhea  Physical Exam:  Vitals:   12/05/17 1607  BP: (!) 119/52  Pulse: (!) 58  Temp: 99.4 F (37.4 C)  TempSrc: Oral  SpO2: 100%  Weight: 119 lb 9.6 oz (54.3 kg)   Physical Exam  Constitutional: No distress.  Cardiovascular: Normal rate, regular rhythm and normal heart sounds. Exam reveals no gallop and no friction rub.  No murmur heard. Pulmonary/Chest: Effort normal and breath sounds normal. No respiratory distress. She has no wheezes. She has no rales. She exhibits no tenderness.  Abdominal: Soft. Bowel sounds are normal. She exhibits no distension and no mass. There is no tenderness. There is no rebound and no guarding.  Neurological: She is alert.  Skin: She is not diaphoretic.    Social History   Socioeconomic History  . Marital status: Married    Spouse name: Not on file  . Number of  children: Not on file  . Years of education: Not on file  . Highest education level: Not on file  Occupational History  . Not on file  Social Needs  . Financial resource strain: Not on file  . Food insecurity:    Worry: Not on file    Inability: Not on file  . Transportation needs:    Medical: Not on file    Non-medical: Not on file  Tobacco Use  . Smoking status: Never Smoker  . Smokeless tobacco: Never Used  Substance and Sexual Activity  . Alcohol use: No  . Drug use: No  . Sexual activity: Not on file  Lifestyle  . Physical activity:    Days per week: Not on file    Minutes per session: Not on file  . Stress: Not on file  Relationships  . Social connections:    Talks on phone: Not on file    Gets together: Not on file    Attends religious service: Not on file    Active member of club or organization: Not on file    Attends meetings of clubs or organizations: Not on file    Relationship status: Not on file  . Intimate partner violence:    Fear of current or ex partner: Not on file    Emotionally abused: Not on file    Physically abused: Not on file    Forced sexual activity: Not on file  Other Topics Concern  .  Not on file  Social History Narrative  . Not on file    History reviewed. No pertinent family history.  Assessment & Plan:   See Encounters Tab for problem based charting.  Patient seen with Dr. Cleda Daub

## 2017-12-06 NOTE — Assessment & Plan Note (Signed)
The patient's daughter brought her log of blood sugars this included only fasting sugars checked daily.  The average was about 284 over the last 2 months.  The patient was prescribed 30 units of Levemir daily as monotherapy for her diabetes and told that the daughter can increase by 5 units if the patient remains uncontrolled and so the majority of these measurements were on 35 units of Levemir daily.  Daughter admits she is not able to give her mother the insulin every morning.  She tries to do the best she can with the mother's diet occasionally she will have some breads and flour based products tries to avoid sugars.  We spent the majority of our time trying to talk  the patient's daughter and to trying a new medicine for the patient Xultophy.  Eventually we were able to talk the patient's daughter into this.  -We will start the patient out on Xultophy 16 units daily.  We gave the patient's daughter titration instructions according to the manufacturer's recommendations these were in printed form and we went over these instructions with her in detail. -We gave the patient instructions on how to check the patient's glucose and reasons to return earlier than her 1 month appointment follow-up

## 2017-12-07 NOTE — Progress Notes (Signed)
Internal Medicine Clinic Attending  I saw and evaluated the patient.  I personally confirmed the key portions of the history and exam documented by Dr. Winfrey and I reviewed pertinent patient test results.  The assessment, diagnosis, and plan were formulated together and I agree with the documentation in the resident's note. 

## 2017-12-14 ENCOUNTER — Other Ambulatory Visit: Payer: Self-pay

## 2017-12-14 ENCOUNTER — Ambulatory Visit (INDEPENDENT_AMBULATORY_CARE_PROVIDER_SITE_OTHER): Payer: Medicare HMO | Admitting: Internal Medicine

## 2017-12-14 VITALS — BP 135/61 | HR 63 | Temp 98.5°F | Ht 62.0 in | Wt 117.0 lb

## 2017-12-14 DIAGNOSIS — R630 Anorexia: Secondary | ICD-10-CM | POA: Diagnosis not present

## 2017-12-14 DIAGNOSIS — Z794 Long term (current) use of insulin: Secondary | ICD-10-CM | POA: Diagnosis not present

## 2017-12-14 DIAGNOSIS — Z6821 Body mass index (BMI) 21.0-21.9, adult: Secondary | ICD-10-CM

## 2017-12-14 DIAGNOSIS — K5900 Constipation, unspecified: Secondary | ICD-10-CM | POA: Diagnosis not present

## 2017-12-14 DIAGNOSIS — E1165 Type 2 diabetes mellitus with hyperglycemia: Secondary | ICD-10-CM

## 2017-12-14 DIAGNOSIS — E118 Type 2 diabetes mellitus with unspecified complications: Secondary | ICD-10-CM | POA: Diagnosis not present

## 2017-12-14 MED ORDER — INSULIN DETEMIR 100 UNIT/ML ~~LOC~~ SOLN: 35.0000 [IU] | Freq: Every day | SUBCUTANEOUS | 11 refills | Status: DC

## 2017-12-14 NOTE — Patient Instructions (Addendum)
Sharon Butler, I am sorry the medication has not been working well for you.  We will switch back to the levemir starting back at your usual 35 units.  You can adjust the levemir by 10% every week as we discussed.

## 2017-12-14 NOTE — Progress Notes (Addendum)
CC: loss of appetite, constipation, uncontrolled T2DM  HPI:  [name] is a 84 y.o. female with PMH below.  She is here to address side effects of xultophy and inadequate blood glucose control.    Please see A&P for status of the patient's chronic medical conditions  Past Medical History:  Diagnosis Date  . Alzheimer's dementia    "dx'd 05/2015; final stages" (06/15/2016)  . Complication of anesthesia    "problems waking up from gallbladder OR in 1978"  . COPD (chronic obstructive pulmonary disease) (HCC)   . High cholesterol   . History of blood transfusion 1996   "related to appendix OR"  . Infected surgical wound 07/2016   rt hip  . Type II diabetes mellitus (HCC) 1998   Review of Systems:  ROS: Pulmonary: pt denies increased work of breathing, shortness of breath,  Cardiac: pt denies palpitations, chest pain,  Abdominal: pt denies abdominal pain, nausea, vomiting, or diarrhea  Physical Exam:  Vitals:   12/14/17 1530  BP: 135/61  Pulse: 63  Temp: 98.5 F (36.9 C)  TempSrc: Oral  SpO2: 99%  Weight: 117 lb (53.1 kg)  Height:  (1.575 m)   Physical Exam  Constitutional: No distress.  Cardiovascular: Normal rate, regular rhythm and normal heart sounds. Exam reveals no gallop and no friction rub.  No murmur heard. Pulmonary/Chest: Effort normal and breath sounds normal. No respiratory distress. She has no wheezes. She has no rales. She exhibits no tenderness.  Abdominal: Soft. Bowel sounds are normal. She exhibits no distension and no mass. There is no tenderness. There is no rebound and no guarding.  Neurological: She is alert.  Skin: She is not diaphoretic.    Social History   Socioeconomic History  . Marital status: Married    Spouse name: Not on file  . Number of children: Not on file  . Years of education: Not on file  . Highest education level: Not on file  Occupational History  . Not on file  Social Needs  . Financial resource strain:  Not on file  . Food insecurity:    Worry: Not on file    Inability: Not on file  . Transportation needs:    Medical: Not on file    Non-medical: Not on file  Tobacco Use  . Smoking status: Never Smoker  . Smokeless tobacco: Never Used  Substance and Sexual Activity  . Alcohol use: No  . Drug use: No  . Sexual activity: Not on file  Lifestyle  . Physical activity:    Days per week: Not on file    Minutes per session: Not on file  . Stress: Not on file  Relationships  . Social connections:    Talks on phone: Not on file    Gets together: Not on file    Attends religious service: Not on file    Active member of club or organization: Not on file    Attends meetings of clubs or organizations: Not on file    Relationship status: Not on file  . Intimate partner violence:    Fear of current or ex partner: Not on file    Emotionally abused: Not on file    Physically abused: Not on file    Forced sexual activity: Not on file  Other Topics Concern  . Not on file  Social History Narrative  . Not on file    No family history on file.  Assessment & Plan:   See Encounters  Tab for problem based charting.  Patient discussed with Dr. Heide Spark

## 2017-12-15 NOTE — Assessment & Plan Note (Addendum)
She is brought here by her daughter with concerns about her xultophy causing constipation and decreased appetite.  Pt has no constipation usually.  Last bowel movement Wednesday small one, small hard stool today.  Pt will not take oral meds willingly and daughter has to get creative. Daughter began giving miralax at home over the last two days.  -offered daughter an additional laxative but she declined and said she "wants to use natural remedies like prune juice" in addition to the otc miralax.

## 2017-12-15 NOTE — Assessment & Plan Note (Addendum)
Patient's daughter reports pt has no appetite on the xultophy and won't eat, additionally she reports that the patient has developed constipation and that this new medication is not lowering her blood sugar well.  Pt is averaging about high 200's for her blood sugars.  Glucose 285 this afternoon.   Unfortunately we are severely limited on this treatment.  According to daughter she cannot give her mother oral pills because she will refuse and only allows her one injection and one blood sugar check per day, two at the most.  She is very unhappy with the xultophy and would like to change back to the levemir.    -gave pt instructions about restarting levemir once xultophy has washed out  -we will start back the levemir at the 35 units the patient was on before switching to xultophy.   -Pts daughter given detailed instructions about how we will titrate this, she is competent enough having extensive experience with diabetes herself that  I feel comfortable allowing a titration. -instructed pt to begin with 35 units, every week take an average of her cbg's if continues to be well above 200 increase by 10%, gave her an example and she was able to calculate this quickly and correctly.  The goal is to get around 200 with the hope of slightly below

## 2017-12-15 NOTE — Progress Notes (Signed)
Internal Medicine Clinic Attending  Case discussed with Dr. Winfrey  at the time of the visit.  We reviewed the resident's history and exam and pertinent patient test results.  I agree with the assessment, diagnosis, and plan of care documented in the resident's note.  

## 2017-12-17 ENCOUNTER — Other Ambulatory Visit: Payer: Self-pay | Admitting: Internal Medicine

## 2017-12-17 DIAGNOSIS — I82412 Acute embolism and thrombosis of left femoral vein: Secondary | ICD-10-CM

## 2018-01-05 ENCOUNTER — Encounter: Payer: Medicare HMO | Admitting: Internal Medicine

## 2018-01-26 ENCOUNTER — Ambulatory Visit (INDEPENDENT_AMBULATORY_CARE_PROVIDER_SITE_OTHER): Payer: Medicare HMO | Admitting: Internal Medicine

## 2018-01-26 ENCOUNTER — Other Ambulatory Visit: Payer: Self-pay

## 2018-01-26 ENCOUNTER — Encounter: Payer: Self-pay | Admitting: Internal Medicine

## 2018-01-26 VITALS — BP 143/54 | HR 63 | Temp 98.4°F | Wt 114.5 lb

## 2018-01-26 DIAGNOSIS — E1165 Type 2 diabetes mellitus with hyperglycemia: Secondary | ICD-10-CM

## 2018-01-26 DIAGNOSIS — Z9114 Patient's other noncompliance with medication regimen: Secondary | ICD-10-CM | POA: Diagnosis not present

## 2018-01-26 DIAGNOSIS — Z794 Long term (current) use of insulin: Secondary | ICD-10-CM

## 2018-01-26 DIAGNOSIS — E118 Type 2 diabetes mellitus with unspecified complications: Secondary | ICD-10-CM

## 2018-01-26 NOTE — Patient Instructions (Signed)
Sharon Butler,    Continue taking your Levemir as usual. We checked your kidney function today. I will give you a call with the results.   Please call us if you have any questions or concerns.   - Dr. Evelene CroonSantos

## 2018-01-27 LAB — BMP8+ANION GAP
Anion Gap: 13 mmol/L (ref 10.0–18.0)
BUN/Creatinine Ratio: 22 (ref 12–28)
BUN: 20 mg/dL (ref 8–27)
CO2: 22 mmol/L (ref 20–29)
Calcium: 9.7 mg/dL (ref 8.7–10.3)
Chloride: 103 mmol/L (ref 96–106)
Creatinine, Ser: 0.9 mg/dL (ref 0.57–1.00)
GFR calc Af Amer: 66 mL/min/{1.73_m2} (ref >59)
GFR calc non Af Amer: 58 mL/min/{1.73_m2} — ABNORMAL LOW (ref >59)
Glucose: 240 mg/dL — ABNORMAL HIGH (ref 65–99)
Potassium: 4.2 mmol/L (ref 3.5–5.2)
Sodium: 138 mmol/L (ref 134–144)

## 2018-01-29 ENCOUNTER — Encounter: Payer: Self-pay | Admitting: Internal Medicine

## 2018-01-29 NOTE — Assessment & Plan Note (Addendum)
T2DM, uncontrolled: Patient has a long history of type 2 diabetes that is uncontrolled.  She is currently on Levemir 40 units daily after recently stopping Xultophy due to constipation. Diabetes management has been complicated by compliance as patient declines multiple insulin shots throughout the day and only allows her care giver to administer one shot per day. She also declines to take oral medications. Her last A1c was 10.8 in 11/2017. She is not due for one today. She brought her BG meter today.  However, she only checks her BG once a day. No hypoglycemic episodes noted. She does have a few readings in the 300-400s. Per caregiver, she forgot to administer insulin those days. Would like to increase Levemir dose to 45 units daily. However, will continue current regimen for now per patient and caregiver preference. Foot exam performed today. Recommended eye exam.  - Continue Levemir 40 units daily  - BMP to check renal function  - Follow up in 3 months   Renal function remains at baseline.

## 2018-01-29 NOTE — Progress Notes (Signed)
Medicine attending: Medical history, presenting problems, physical findings, and medications, reviewed with resident physician Dr Santos-Sanchez on the day of the patient visit and I concur with her evaluation and management plan. 

## 2018-01-29 NOTE — Progress Notes (Signed)
   CC: T2DM follow up   HPI:  Sharon Butler is a 84 y.o. female with PMH listed below who presents to clinic for type 2 diabetes follow-up.  T2DM, uncontrolled: Patient has a long history of type 2 diabetes that is uncontrolled.  She is currently on Levemir 40 units daily after recently stopping Xultophy due to constipation. Diabetes management has been complicated by compliance as patient declines multiple insulin shots throughout the day and only allows her care giver to administer one shot per day. She also declines to take oral medications. Her last A1c was 10.8 in 11/2017. She is not due for one today. She brought her BG meter today.  However, she only checks her BG once a day. No hypoglycemic episodes noted. She does have a few readings in the 300-400s. Per caregiver, she forgot to administer insulin those days. Would like to increase Levemir dose to 45 units daily. However, will continue current regimen for now per patient and caregiver preference. Will continue to discuss changes in Levemir dose at next visit. Foot exam performed today. Recommended eye exam.    Past Medical History:  Diagnosis Date  . Alzheimer's dementia    "dx'd 05/2015; final stages" (06/15/2016)  . Complication of anesthesia    "problems waking up from gallbladder OR in 1978"  . COPD (chronic obstructive pulmonary disease) (HCC)   . High cholesterol   . History of blood transfusion 1996   "related to appendix OR"  . Infected surgical wound 07/2016   rt hip  . Type II diabetes mellitus (HCC) 1998   Review of Systems:   Review of Systems  Constitutional: Negative for chills, fever, malaise/fatigue and weight loss.  Respiratory: Negative for cough and shortness of breath.   Cardiovascular: Negative for chest pain, palpitations and leg swelling.  Gastrointestinal: Negative for abdominal pain, constipation, diarrhea, nausea and vomiting.  Genitourinary: Negative for dysuria, frequency and urgency.    Neurological: Negative for dizziness and headaches.    Physical Exam:  Vitals:   01/26/18 1552  BP: (!) 143/54  Pulse: 63  Temp: 98.4 F (36.9 C)  TempSrc: Oral  SpO2: 99%  Weight: 114 lb 8 oz (51.9 kg)   Physical Exam  Constitutional:  Chronically ill appearing female, walking around the room in no acute distress   Cardiovascular: Normal rate, regular rhythm, normal heart sounds and intact distal pulses. Exam reveals no gallop and no friction rub.  No murmur heard. Pulmonary/Chest: Effort normal and breath sounds normal. No respiratory distress. She has no wheezes. She has no rales.  Musculoskeletal: She exhibits no edema.  Neurological: She is alert.  Oriented to self only     Assessment & Plan:   See Encounters Tab for problem based charting.  Patient discussed with Dr. Cyndie ChimeGranfortuna

## 2018-03-12 DIAGNOSIS — G8929 Other chronic pain: Secondary | ICD-10-CM | POA: Diagnosis not present

## 2018-03-12 DIAGNOSIS — Z880 Allergy status to penicillin: Secondary | ICD-10-CM | POA: Diagnosis not present

## 2018-03-12 DIAGNOSIS — Z794 Long term (current) use of insulin: Secondary | ICD-10-CM | POA: Diagnosis not present

## 2018-03-12 DIAGNOSIS — Z7901 Long term (current) use of anticoagulants: Secondary | ICD-10-CM | POA: Diagnosis not present

## 2018-03-12 DIAGNOSIS — K08409 Partial loss of teeth, unspecified cause, unspecified class: Secondary | ICD-10-CM | POA: Diagnosis not present

## 2018-03-12 DIAGNOSIS — Z86718 Personal history of other venous thrombosis and embolism: Secondary | ICD-10-CM | POA: Diagnosis not present

## 2018-03-12 DIAGNOSIS — Z809 Family history of malignant neoplasm, unspecified: Secondary | ICD-10-CM | POA: Diagnosis not present

## 2018-03-12 DIAGNOSIS — Z833 Family history of diabetes mellitus: Secondary | ICD-10-CM | POA: Diagnosis not present

## 2018-03-12 DIAGNOSIS — Z91041 Radiographic dye allergy status: Secondary | ICD-10-CM | POA: Diagnosis not present

## 2018-03-12 DIAGNOSIS — E1165 Type 2 diabetes mellitus with hyperglycemia: Secondary | ICD-10-CM | POA: Diagnosis not present

## 2018-06-07 ENCOUNTER — Other Ambulatory Visit: Payer: Self-pay | Admitting: Internal Medicine

## 2018-06-07 DIAGNOSIS — E1165 Type 2 diabetes mellitus with hyperglycemia: Secondary | ICD-10-CM

## 2018-07-31 DIAGNOSIS — Z825 Family history of asthma and other chronic lower respiratory diseases: Secondary | ICD-10-CM | POA: Diagnosis not present

## 2018-07-31 DIAGNOSIS — Z7901 Long term (current) use of anticoagulants: Secondary | ICD-10-CM | POA: Diagnosis not present

## 2018-07-31 DIAGNOSIS — E119 Type 2 diabetes mellitus without complications: Secondary | ICD-10-CM | POA: Diagnosis not present

## 2018-07-31 DIAGNOSIS — Z8249 Family history of ischemic heart disease and other diseases of the circulatory system: Secondary | ICD-10-CM | POA: Diagnosis not present

## 2018-07-31 DIAGNOSIS — R32 Unspecified urinary incontinence: Secondary | ICD-10-CM | POA: Diagnosis not present

## 2018-07-31 DIAGNOSIS — Z794 Long term (current) use of insulin: Secondary | ICD-10-CM | POA: Diagnosis not present

## 2018-07-31 DIAGNOSIS — Z809 Family history of malignant neoplasm, unspecified: Secondary | ICD-10-CM | POA: Diagnosis not present

## 2018-07-31 DIAGNOSIS — R269 Unspecified abnormalities of gait and mobility: Secondary | ICD-10-CM | POA: Diagnosis not present

## 2018-07-31 DIAGNOSIS — Z591 Inadequate housing: Secondary | ICD-10-CM | POA: Diagnosis not present

## 2018-07-31 DIAGNOSIS — R69 Illness, unspecified: Secondary | ICD-10-CM | POA: Diagnosis not present

## 2018-08-27 ENCOUNTER — Other Ambulatory Visit: Payer: Self-pay | Admitting: Internal Medicine

## 2018-08-27 DIAGNOSIS — Z86718 Personal history of other venous thrombosis and embolism: Secondary | ICD-10-CM

## 2018-09-28 ENCOUNTER — Ambulatory Visit (INDEPENDENT_AMBULATORY_CARE_PROVIDER_SITE_OTHER): Payer: Medicare HMO | Admitting: Internal Medicine

## 2018-09-28 VITALS — BP 136/49 | HR 52 | Wt 109.3 lb

## 2018-09-28 DIAGNOSIS — Z7901 Long term (current) use of anticoagulants: Secondary | ICD-10-CM | POA: Diagnosis not present

## 2018-09-28 DIAGNOSIS — Z794 Long term (current) use of insulin: Secondary | ICD-10-CM | POA: Diagnosis not present

## 2018-09-28 DIAGNOSIS — W108XXA Fall (on) (from) other stairs and steps, initial encounter: Secondary | ICD-10-CM

## 2018-09-28 DIAGNOSIS — S81801A Unspecified open wound, right lower leg, initial encounter: Secondary | ICD-10-CM | POA: Diagnosis not present

## 2018-09-28 DIAGNOSIS — Z9181 History of falling: Secondary | ICD-10-CM

## 2018-09-28 DIAGNOSIS — E118 Type 2 diabetes mellitus with unspecified complications: Secondary | ICD-10-CM

## 2018-09-28 DIAGNOSIS — W19XXXA Unspecified fall, initial encounter: Secondary | ICD-10-CM

## 2018-09-28 DIAGNOSIS — F0390 Unspecified dementia without behavioral disturbance: Secondary | ICD-10-CM | POA: Diagnosis not present

## 2018-09-28 DIAGNOSIS — E1165 Type 2 diabetes mellitus with hyperglycemia: Secondary | ICD-10-CM

## 2018-09-28 DIAGNOSIS — R69 Illness, unspecified: Secondary | ICD-10-CM | POA: Diagnosis not present

## 2018-09-28 LAB — POCT GLYCOSYLATED HEMOGLOBIN (HGB A1C): HEMOGLOBIN A1C: 9.8 % — AB (ref 4.0–5.6)

## 2018-09-28 LAB — GLUCOSE, CAPILLARY: Glucose-Capillary: 222 mg/dL — ABNORMAL HIGH (ref 70–99)

## 2018-09-28 NOTE — Patient Instructions (Signed)
Ms. Fell,   I'm sorry you had a fall recently. Continue to keep an eye on the wound. If you notice new pain, redness, or discharge please make an appointment to be evaluated as you may need antibiotics.   Continue using Levemir 50 units every day. You can continue to uptitrate 10%  If sugars are above 200. I will speak to our diabetes educator to send you a 90 day supply.   I will also speak to our pharmacist to see if we can help you get Xarelto cheaper.   Make a follow up appointment with me in 3 months or sooner if needed. Call us if you have any questions or concerns.   -Dr. Evelene Croon

## 2018-09-30 DIAGNOSIS — W19XXXA Unspecified fall, initial encounter: Secondary | ICD-10-CM | POA: Insufficient documentation

## 2018-09-30 NOTE — Progress Notes (Signed)
   CC: T2DM follow up and fall   HPI:  Sharon Butler is a 85 y.o. year-old female with PMH listed below who presents to clinic for T2DM follow up and fall. Please see problem based assessment and plan for further details.   Past Medical History:  Diagnosis Date  . Alzheimer's dementia    "dx'd 05/2015; final stages" (06/15/2016)  . Complication of anesthesia    "problems waking up from gallbladder OR in 1978"  . COPD (chronic obstructive pulmonary disease) (HCC)   . High cholesterol   . History of blood transfusion 1996   "related to appendix OR"  . Infected surgical wound 07/2016   rt hip  . Type II diabetes mellitus (HCC) 1998   Review of Systems:   Review of Systems  Constitutional: Negative for chills and fever.  Genitourinary: Negative for frequency and urgency.  Musculoskeletal: Positive for falls. Negative for back pain, joint pain and myalgias.  Endo/Heme/Allergies: Negative for polydipsia.    Physical Exam: Vitals:   09/28/18 1507 09/28/18 1544  BP: (!) 154/63 (!) 136/49  Pulse: (!) 53 (!) 52  SpO2: 100%   Weight: 109 lb 4.8 oz (49.6 kg)    General: well-appearing elderly female, in NAD  Cardiac: regular rate and rhythm, nl S1/S2, no murmurs, rubs or gallops  Pulm: CTAB, no wheezes or crackles, no increased work of breathing on room air  Ext: warm and well perfused, no peripheral edema. There is a wound on the posterior aspect of RLE that is scabbed. Very mild surrounding erythema, but no warmth or discharge noted.     Assessment & Plan:   See Encounters Tab for problem based charting.  Patient discussed with Dr. Heide Spark

## 2018-10-01 ENCOUNTER — Encounter: Payer: Self-pay | Admitting: Internal Medicine

## 2018-10-01 NOTE — Assessment & Plan Note (Signed)
Ms. Bridwell has a history of uncontrolled T2DM. Management has been difficult due to difficulty swallowing tablets per family report. They also report patient only allows them to inject her once a day which has limit [place] to long-acting insulin. She is currently on Levemir 50 units and depends on her daughter and husband for her daily injections. A1c is 9.8 from 10.8 one year ago and CBG 228 which is the best I have seen to date. She did not bring her meter. She only checks her BG once a day and daughter reports it has been 200-280s. Patient denies polyruia and polydipsia though she is usually unable to voice complaints due to her advanced dementia.   Family would like to continue current regimen as she has experienced a number of side effects whenever her medications are changed and they would like to avoid this. Will continue Levemir 50 units daily. Recommended increasing to 55 units if BG remain above 200. Follow up in 3 months.

## 2018-10-01 NOTE — Assessment & Plan Note (Addendum)
Daughter reports patient fell at home 1 week ago. She was walking outside the house unsupervised and missed a step at the main door. She did not hit her head or lose consciousness. Denies prodromal symptoms though she does not remember this event. She has a wound on the posterior aspect of the RLE that appears to be healing. No signs of infection noted. No other injuries. She has been able to ambulate without difficulty since this event and is able to bear weight in both lower extremities. Will not obtain imaging at this time as patient is ambulatory and without complaints. Advised family to monitor wound for healing and signs of infection given her history of uncontrolled DM.

## 2018-10-01 NOTE — Progress Notes (Signed)
Internal Medicine Clinic Attending  Case discussed with Dr. Santos-Sanchez at the time of the visit.  We reviewed the resident's history and exam and pertinent patient test results.  I agree with the assessment, diagnosis, and plan of care documented in the resident's note.    

## 2018-10-04 ENCOUNTER — Ambulatory Visit: Payer: Medicare HMO | Admitting: Pharmacist

## 2018-10-29 ENCOUNTER — Telehealth: Payer: Self-pay | Admitting: *Deleted

## 2018-10-29 DIAGNOSIS — Z515 Encounter for palliative care: Secondary | ICD-10-CM

## 2018-10-29 NOTE — Telephone Encounter (Signed)
Per dr Cyndie Chime spoke w/ daughter about hosp and pallative care, she is agreeable will need referral placed. Will forward to dr Midwife, since she is attending this pm

## 2018-10-29 NOTE — Telephone Encounter (Signed)
Pt's daughter calls and states pt is more confused, lethargic, she is not eating very much, has not had BM since Thursday but daughter can feel stool when she cleans pt. She is taking fluids just not as much as normal. blood sugar is 170's to 200's. She is voiding enough to soak herself. Daughter is concerned because changes are drastic. She refuses ED or visit due to transportation. Could we possibly start home health services? You may call daughter at [phone]

## 2018-10-29 NOTE — Telephone Encounter (Signed)
Order in.

## 2018-10-29 NOTE — Telephone Encounter (Signed)
Spoke w/ fran from hosp and pallative care after speaking w/ daughter of pt, could we please put in referral to hosp/ pallative care so they can make contact w/ pt

## 2018-10-31 ENCOUNTER — Other Ambulatory Visit: Payer: Self-pay | Admitting: *Deleted

## 2018-10-31 DIAGNOSIS — E1165 Type 2 diabetes mellitus with hyperglycemia: Secondary | ICD-10-CM

## 2018-10-31 MED ORDER — SULFAMETHOXAZOLE-TRIMETHOPRIM 200-40 MG/5ML PO SUSP: 20.0000 mL | Freq: Two times a day (BID) | ORAL | 0 refills | Status: AC

## 2018-10-31 NOTE — Telephone Encounter (Signed)
pallative care of [facility] made a visit yesterday she states pt has become completely incontinent of urine, it has a strong odor, is almost tea colored but does void a great amount, daughter states it reminds her of past uti's w/ pt. Pt is very confused but is alert. She needs total assistance getting up for bathing totally by someone and transferred totally by someone. Chyrl Civatte feels an in and out cath may be too traumatic on pt in her current state and therefore too traumatic for family. Daughter did state pt has allergies to several meds but she has had bicillin before and done well. Pt's intake of fluids is good but food intake is poor, a few bites of oatmeal, yogurt and soup. Daughter continues to worry about BM none since Thursday- 6 days ago. She has tried a few prunes and watered down juice, she has senna in the home but would like advice on using it. Pt's fasting blood sugar was 83 this am. But in the last week has been as high as 400. Chyrl Civatte states that family has made it clear no heroic actions, DNR, will not leave home to come to hospital. Family is not ready to agree to hospice yet, joann states they will move in discussion to that classification. Joann states hosp/pallative care md will sign DNR if you desire.  So main advice today, will you order an abx in case pt does have a UTI without invasive collection of urine? orders will [facility] usual to [facility] with dr Midwife listed as attending

## 2018-10-31 NOTE — Telephone Encounter (Signed)
She took this in 2018.  DNR OK if family agrees

## 2018-11-01 MED ORDER — INSULIN DETEMIR 100 UNIT/ML ~~LOC~~ SOLN: 35.0000 [IU] | Freq: Every day | SUBCUTANEOUS | 1 refills | Status: DC

## 2018-11-01 NOTE — Telephone Encounter (Signed)
Spoke w/ pallative care nurse and family, they are very appreciative

## 2018-11-24 ENCOUNTER — Inpatient Hospital Stay (HOSPITAL_COMMUNITY): Payer: Medicare HMO

## 2018-11-24 ENCOUNTER — Inpatient Hospital Stay (HOSPITAL_COMMUNITY)
Admission: EM | Admit: 2018-11-24 | Discharge: 2018-11-28 | DRG: 638 | Disposition: A | Discharge status: Home / Self Care | Payer: Medicare HMO | Attending: Internal Medicine | Admitting: Internal Medicine

## 2018-11-24 DIAGNOSIS — Z961 Presence of intraocular lens: Secondary | ICD-10-CM | POA: Diagnosis present

## 2018-11-24 DIAGNOSIS — E440 Moderate protein-calorie malnutrition: Secondary | ICD-10-CM | POA: Diagnosis not present

## 2018-11-24 DIAGNOSIS — E1151 Type 2 diabetes mellitus with diabetic peripheral angiopathy without gangrene: Secondary | ICD-10-CM | POA: Diagnosis not present

## 2018-11-24 DIAGNOSIS — R4689 Other symptoms and signs involving appearance and behavior: Secondary | ICD-10-CM

## 2018-11-24 DIAGNOSIS — E1165 Type 2 diabetes mellitus with hyperglycemia: Secondary | ICD-10-CM | POA: Diagnosis present

## 2018-11-24 DIAGNOSIS — R339 Retention of urine, unspecified: Secondary | ICD-10-CM | POA: Diagnosis present

## 2018-11-24 DIAGNOSIS — Z9842 Cataract extraction status, left eye: Secondary | ICD-10-CM | POA: Diagnosis not present

## 2018-11-24 DIAGNOSIS — L89159 Pressure ulcer of sacral region, unspecified stage: Secondary | ICD-10-CM | POA: Diagnosis not present

## 2018-11-24 DIAGNOSIS — T17200A Unspecified foreign body in pharynx causing asphyxiation, initial encounter: Secondary | ICD-10-CM | POA: Diagnosis not present

## 2018-11-24 DIAGNOSIS — L89152 Pressure ulcer of sacral region, stage 2: Secondary | ICD-10-CM | POA: Diagnosis present

## 2018-11-24 DIAGNOSIS — Z91013 Allergy to seafood: Secondary | ICD-10-CM

## 2018-11-24 DIAGNOSIS — K0889 Other specified disorders of teeth and supporting structures: Secondary | ICD-10-CM | POA: Diagnosis not present

## 2018-11-24 DIAGNOSIS — K056 Periodontal disease, unspecified: Secondary | ICD-10-CM | POA: Diagnosis not present

## 2018-11-24 DIAGNOSIS — R5381 Other malaise: Secondary | ICD-10-CM | POA: Diagnosis not present

## 2018-11-24 DIAGNOSIS — J449 Chronic obstructive pulmonary disease, unspecified: Secondary | ICD-10-CM | POA: Diagnosis not present

## 2018-11-24 DIAGNOSIS — G919 Hydrocephalus, unspecified: Secondary | ICD-10-CM | POA: Diagnosis present

## 2018-11-24 DIAGNOSIS — T17908A Unspecified foreign body in respiratory tract, part unspecified causing other injury, initial encounter: Secondary | ICD-10-CM

## 2018-11-24 DIAGNOSIS — E876 Hypokalemia: Secondary | ICD-10-CM | POA: Diagnosis present

## 2018-11-24 DIAGNOSIS — R69 Illness, unspecified: Secondary | ICD-10-CM | POA: Diagnosis not present

## 2018-11-24 DIAGNOSIS — I4510 Unspecified right bundle-branch block: Secondary | ICD-10-CM | POA: Diagnosis not present

## 2018-11-24 DIAGNOSIS — Z91018 Allergy to other foods: Secondary | ICD-10-CM

## 2018-11-24 DIAGNOSIS — R0902 Hypoxemia: Secondary | ICD-10-CM | POA: Diagnosis not present

## 2018-11-24 DIAGNOSIS — M2550 Pain in unspecified joint: Secondary | ICD-10-CM | POA: Diagnosis not present

## 2018-11-24 DIAGNOSIS — Z7401 Bed confinement status: Secondary | ICD-10-CM

## 2018-11-24 DIAGNOSIS — Z794 Long term (current) use of insulin: Secondary | ICD-10-CM | POA: Diagnosis not present

## 2018-11-24 DIAGNOSIS — R404 Transient alteration of awareness: Secondary | ICD-10-CM | POA: Diagnosis not present

## 2018-11-24 DIAGNOSIS — I10 Essential (primary) hypertension: Secondary | ICD-10-CM | POA: Diagnosis not present

## 2018-11-24 DIAGNOSIS — Z681 Body mass index (BMI) 19 or less, adult: Secondary | ICD-10-CM | POA: Diagnosis not present

## 2018-11-24 DIAGNOSIS — E1310 Other specified diabetes mellitus with ketoacidosis without coma: Secondary | ICD-10-CM | POA: Diagnosis not present

## 2018-11-24 DIAGNOSIS — Z7901 Long term (current) use of anticoagulants: Secondary | ICD-10-CM

## 2018-11-24 DIAGNOSIS — E7800 Pure hypercholesterolemia, unspecified: Secondary | ICD-10-CM | POA: Diagnosis present

## 2018-11-24 DIAGNOSIS — G309 Alzheimer's disease, unspecified: Secondary | ICD-10-CM | POA: Diagnosis not present

## 2018-11-24 DIAGNOSIS — E1110 Type 2 diabetes mellitus with ketoacidosis without coma: Secondary | ICD-10-CM | POA: Diagnosis not present

## 2018-11-24 DIAGNOSIS — Z9841 Cataract extraction status, right eye: Secondary | ICD-10-CM | POA: Diagnosis not present

## 2018-11-24 DIAGNOSIS — Z87440 Personal history of urinary (tract) infections: Secondary | ICD-10-CM | POA: Diagnosis not present

## 2018-11-24 DIAGNOSIS — Z1159 Encounter for screening for other viral diseases: Secondary | ICD-10-CM | POA: Diagnosis not present

## 2018-11-24 DIAGNOSIS — E860 Dehydration: Secondary | ICD-10-CM | POA: Diagnosis present

## 2018-11-24 DIAGNOSIS — F0280 Dementia in other diseases classified elsewhere without behavioral disturbance: Secondary | ICD-10-CM | POA: Diagnosis not present

## 2018-11-24 DIAGNOSIS — J9811 Atelectasis: Secondary | ICD-10-CM | POA: Diagnosis not present

## 2018-11-24 DIAGNOSIS — L8990 Pressure ulcer of unspecified site, unspecified stage: Secondary | ICD-10-CM | POA: Diagnosis present

## 2018-11-24 DIAGNOSIS — Z880 Allergy status to penicillin: Secondary | ICD-10-CM

## 2018-11-24 DIAGNOSIS — Z9114 Patient's other noncompliance with medication regimen: Secondary | ICD-10-CM | POA: Diagnosis not present

## 2018-11-24 DIAGNOSIS — E46 Unspecified protein-calorie malnutrition: Secondary | ICD-10-CM | POA: Diagnosis not present

## 2018-11-24 DIAGNOSIS — N179 Acute kidney failure, unspecified: Secondary | ICD-10-CM | POA: Diagnosis not present

## 2018-11-24 DIAGNOSIS — E870 Hyperosmolality and hypernatremia: Secondary | ICD-10-CM | POA: Diagnosis present

## 2018-11-24 DIAGNOSIS — Z66 Do not resuscitate: Secondary | ICD-10-CM | POA: Diagnosis not present

## 2018-11-24 DIAGNOSIS — F0390 Unspecified dementia without behavioral disturbance: Secondary | ICD-10-CM | POA: Diagnosis present

## 2018-11-24 DIAGNOSIS — R05 Cough: Secondary | ICD-10-CM | POA: Diagnosis not present

## 2018-11-24 DIAGNOSIS — R4182 Altered mental status, unspecified: Secondary | ICD-10-CM | POA: Diagnosis not present

## 2018-11-24 DIAGNOSIS — E118 Type 2 diabetes mellitus with unspecified complications: Secondary | ICD-10-CM | POA: Diagnosis not present

## 2018-11-24 DIAGNOSIS — K5900 Constipation, unspecified: Secondary | ICD-10-CM | POA: Diagnosis not present

## 2018-11-24 DIAGNOSIS — R000 Tachycardia, unspecified: Secondary | ICD-10-CM | POA: Diagnosis not present

## 2018-11-24 DIAGNOSIS — Z885 Allergy status to narcotic agent status: Secondary | ICD-10-CM

## 2018-11-24 LAB — POCT I-STAT EG7
Acid-base deficit: 11 mmol/L — ABNORMAL HIGH (ref 0.0–2.0)
Bicarbonate: 15.9 mmol/L — ABNORMAL LOW (ref 20.0–28.0)
Calcium, Ion: 1.21 mmol/L (ref 1.15–1.40)
HCT: 38 % (ref 36.0–46.0)
Hemoglobin: 12.9 g/dL (ref 12.0–15.0)
O2 Saturation: 97 %
Potassium: 4.2 mmol/L (ref 3.5–5.1)
Sodium: 151 mmol/L — ABNORMAL HIGH (ref 135–145)
TCO2: 17 mmol/L — ABNORMAL LOW (ref 22–32)
pCO2, Ven: 40.2 mmHg — ABNORMAL LOW (ref 44.0–60.0)
pH, Ven: 7.205 — ABNORMAL LOW (ref 7.250–7.430)
pO2, Ven: 116 mmHg — ABNORMAL HIGH (ref 32.0–45.0)

## 2018-11-24 LAB — BASIC METABOLIC PANEL
Anion gap: 17 — ABNORMAL HIGH (ref 5–15)
Anion gap: 12 (ref 5–15)
Anion gap: 11 (ref 5–15)
BUN: 67 mg/dL — ABNORMAL HIGH (ref 8–23)
BUN: 65 mg/dL — ABNORMAL HIGH (ref 8–23)
BUN: 61 mg/dL — ABNORMAL HIGH (ref 8–23)
CO2: 19 mmol/L — ABNORMAL LOW (ref 22–32)
CO2: 20 mmol/L — ABNORMAL LOW (ref 22–32)
CO2: 21 mmol/L — ABNORMAL LOW (ref 22–32)
Calcium: 9.8 mg/dL (ref 8.9–10.3)
Calcium: 9.7 mg/dL (ref 8.9–10.3)
Calcium: 10 mg/dL (ref 8.9–10.3)
Chloride: 119 mmol/L — ABNORMAL HIGH (ref 98–111)
Chloride: 121 mmol/L — ABNORMAL HIGH (ref 98–111)
Chloride: 126 mmol/L — ABNORMAL HIGH (ref 98–111)
Creatinine, Ser: 2.2 mg/dL — ABNORMAL HIGH (ref 0.44–1.00)
Creatinine, Ser: 1.94 mg/dL — ABNORMAL HIGH (ref 0.44–1.00)
Creatinine, Ser: 1.73 mg/dL — ABNORMAL HIGH (ref 0.44–1.00)
GFR calc Af Amer: 22 mL/min — ABNORMAL LOW (ref >60)
GFR calc Af Amer: 26 mL/min — ABNORMAL LOW (ref >60)
GFR calc Af Amer: 30 mL/min — ABNORMAL LOW (ref >60)
GFR calc non Af Amer: 19 mL/min — ABNORMAL LOW (ref >60)
GFR calc non Af Amer: 23 mL/min — ABNORMAL LOW (ref >60)
GFR calc non Af Amer: 26 mL/min — ABNORMAL LOW (ref >60)
Glucose, Bld: 671 mg/dL (ref 70–99)
Glucose, Bld: 455 mg/dL — ABNORMAL HIGH (ref 70–99)
Glucose, Bld: 292 mg/dL — ABNORMAL HIGH (ref 70–99)
Potassium: 3.1 mmol/L — ABNORMAL LOW (ref 3.5–5.1)
Potassium: 3.1 mmol/L — ABNORMAL LOW (ref 3.5–5.1)
Potassium: 3.2 mmol/L — ABNORMAL LOW (ref 3.5–5.1)
Sodium: 155 mmol/L — ABNORMAL HIGH (ref 135–145)
Sodium: 153 mmol/L — ABNORMAL HIGH (ref 135–145)
Sodium: 158 mmol/L — ABNORMAL HIGH (ref 135–145)

## 2018-11-24 LAB — COMPREHENSIVE METABOLIC PANEL
ALT: 11 U/L (ref 0–44)
AST: 15 U/L (ref 15–41)
Albumin: 3.4 g/dL — ABNORMAL LOW (ref 3.5–5.0)
Alkaline Phosphatase: 120 U/L (ref 38–126)
Anion gap: 27 — ABNORMAL HIGH (ref 5–15)
BUN: 79 mg/dL — ABNORMAL HIGH (ref 8–23)
CO2: 14 mmol/L — ABNORMAL LOW (ref 22–32)
Calcium: 10.4 mg/dL — ABNORMAL HIGH (ref 8.9–10.3)
Chloride: 106 mmol/L (ref 98–111)
Creatinine, Ser: 2.9 mg/dL — ABNORMAL HIGH (ref 0.44–1.00)
GFR calc Af Amer: 16 mL/min — ABNORMAL LOW (ref >60)
GFR calc non Af Amer: 14 mL/min — ABNORMAL LOW (ref >60)
Glucose, Bld: 970 mg/dL (ref 70–99)
Potassium: 4.8 mmol/L (ref 3.5–5.1)
Sodium: 147 mmol/L — ABNORMAL HIGH (ref 135–145)
Total Bilirubin: 1.3 mg/dL — ABNORMAL HIGH (ref 0.3–1.2)
Total Protein: 7.5 g/dL (ref 6.5–8.1)

## 2018-11-24 LAB — CBC
HCT: 49 % — ABNORMAL HIGH (ref 36.0–46.0)
Hemoglobin: 14.8 g/dL (ref 12.0–15.0)
MCH: 30.6 pg (ref 26.0–34.0)
MCHC: 30.2 g/dL (ref 30.0–36.0)
MCV: 101.2 fL — ABNORMAL HIGH (ref 80.0–100.0)
Platelets: 301 10*3/uL (ref 150–400)
RBC: 4.84 MIL/uL (ref 3.87–5.11)
RDW: 14.5 % (ref 11.5–15.5)
WBC: 12.1 10*3/uL — ABNORMAL HIGH (ref 4.0–10.5)
nRBC: 0 % (ref 0.0–0.2)

## 2018-11-24 LAB — TROPONIN I
Troponin I: 0.06 ng/mL (ref <0.03)
Troponin I: 0.06 ng/mL (ref <0.03)

## 2018-11-24 LAB — GLUCOSE, CAPILLARY
Glucose-Capillary: 492 mg/dL — ABNORMAL HIGH (ref 70–99)
Glucose-Capillary: 352 mg/dL — ABNORMAL HIGH (ref 70–99)
Glucose-Capillary: 274 mg/dL — ABNORMAL HIGH (ref 70–99)
Glucose-Capillary: 219 mg/dL — ABNORMAL HIGH (ref 70–99)
Glucose-Capillary: 160 mg/dL — ABNORMAL HIGH (ref 70–99)

## 2018-11-24 LAB — URINALYSIS, ROUTINE W REFLEX MICROSCOPIC
Bilirubin Urine: NEGATIVE
Glucose, UA: >=500 mg/dL — AB
Ketones, ur: 20 mg/dL — AB
Nitrite: NEGATIVE
Protein, ur: NEGATIVE mg/dL
Specific Gravity, Urine: 1.02 (ref 1.005–1.030)
pH: 5 (ref 5.0–8.0)

## 2018-11-24 LAB — CBG MONITORING, ED
Glucose-Capillary: >600 mg/dL (ref 70–99)
Glucose-Capillary: >600 mg/dL (ref 70–99)
Glucose-Capillary: >600 mg/dL (ref 70–99)
Glucose-Capillary: 532 mg/dL (ref 70–99)

## 2018-11-24 LAB — PHOSPHORUS: Phosphorus: 5.1 mg/dL — ABNORMAL HIGH (ref 2.5–4.6)

## 2018-11-24 LAB — MAGNESIUM: Magnesium: 2.9 mg/dL — ABNORMAL HIGH (ref 1.7–2.4)

## 2018-11-24 LAB — SARS CORONAVIRUS 2 BY RT PCR (HOSPITAL ORDER, PERFORMED IN ~~LOC~~ HOSPITAL LAB): SARS Coronavirus 2: NEGATIVE

## 2018-11-24 MED ORDER — SODIUM CHLORIDE 0.9% FLUSH
3.0000 mL | Freq: Two times a day (BID) | INTRAVENOUS | Status: DC
  Administered 2018-11-25 – 2018-11-28 (×6): 3 mL via INTRAVENOUS

## 2018-11-24 MED ORDER — SODIUM CHLORIDE 0.9% FLUSH: 3.0000 mL | Freq: Once | INTRAVENOUS | Status: DC

## 2018-11-24 MED ORDER — ONDANSETRON HCL 4 MG/2ML IJ SOLN: 4.0000 mg | Freq: Four times a day (QID) | INTRAMUSCULAR | Status: DC | PRN

## 2018-11-24 MED ORDER — ONDANSETRON HCL 4 MG PO TABS: 4.0000 mg | ORAL_TABLET | Freq: Four times a day (QID) | ORAL | Status: DC | PRN

## 2018-11-24 MED ORDER — FOLIC ACID 5 MG/ML IJ SOLN
1.0000 mg | Freq: Every day | INTRAMUSCULAR | Status: DC
  Administered 2018-11-24 – 2018-11-28 (×5): 1 mg via INTRAVENOUS
  Filled 2018-11-24 (×5): qty 0.2

## 2018-11-24 MED ORDER — SODIUM CHLORIDE 0.9 % IV SOLN
INTRAVENOUS | Status: DC
  Administered 2018-11-24: 18:00:00 via INTRAVENOUS

## 2018-11-24 MED ORDER — DEXTROSE-NACL 5-0.45 % IV SOLN: INTRAVENOUS | Status: DC

## 2018-11-24 MED ORDER — SODIUM CHLORIDE 0.9 % IV SOLN: INTRAVENOUS | Status: DC

## 2018-11-24 MED ORDER — NAPHAZOLINE-GLYCERIN 0.012-0.2 % OP SOLN
1.0000 [drp] | Freq: Four times a day (QID) | OPHTHALMIC | Status: DC | PRN
  Filled 2018-11-24: qty 15

## 2018-11-24 MED ORDER — DEXTROSE 5 % IV SOLN: INTRAVENOUS | Status: DC

## 2018-11-24 MED ORDER — POTASSIUM CL IN DEXTROSE 5% 20 MEQ/L IV SOLN
20.0000 meq | INTRAVENOUS | Status: DC
  Administered 2018-11-24 – 2018-11-25 (×2): 20 meq via INTRAVENOUS
  Filled 2018-11-24 (×2): qty 1000

## 2018-11-24 MED ORDER — ENOXAPARIN SODIUM 30 MG/0.3ML ~~LOC~~ SOLN
30.0000 mg | SUBCUTANEOUS | Status: DC
  Administered 2018-11-24 – 2018-11-27 (×4): 30 mg via SUBCUTANEOUS
  Filled 2018-11-24 (×5): qty 0.3

## 2018-11-24 MED ORDER — POTASSIUM CHLORIDE 10 MEQ/100ML IV SOLN
10.0000 meq | INTRAVENOUS | Status: AC
  Administered 2018-11-24 (×3): 10 meq via INTRAVENOUS
  Filled 2018-11-24 (×3): qty 100

## 2018-11-24 MED ORDER — INSULIN REGULAR(HUMAN) IN NACL 100-0.9 UT/100ML-% IV SOLN
INTRAVENOUS | Status: DC
  Administered 2018-11-24: 21:00:00 8.6 [IU]/h via INTRAVENOUS
  Filled 2018-11-24: qty 100

## 2018-11-24 MED ORDER — THIAMINE HCL 100 MG/ML IJ SOLN
100.0000 mg | Freq: Every day | INTRAMUSCULAR | Status: DC
  Administered 2018-11-25 – 2018-11-28 (×4): 100 mg via INTRAVENOUS
  Filled 2018-11-24 (×4): qty 2

## 2018-11-24 MED ORDER — LACTATED RINGERS IV BOLUS
2000.0000 mL | Freq: Once | INTRAVENOUS | Status: AC
  Administered 2018-11-24: 15:00:00 2000 mL via INTRAVENOUS

## 2018-11-24 MED ORDER — POTASSIUM CHLORIDE 10 MEQ/100ML IV SOLN: 10.0000 meq | Freq: Once | INTRAVENOUS | Status: DC

## 2018-11-24 MED ORDER — INSULIN REGULAR(HUMAN) IN NACL 100-0.9 UT/100ML-% IV SOLN
INTRAVENOUS | Status: DC
  Administered 2018-11-24: 15:00:00 5.4 [IU]/h via INTRAVENOUS
  Filled 2018-11-24: qty 100

## 2018-11-24 NOTE — Progress Notes (Addendum)
CRITICAL VALUE ALERT  Critical Value:  Glucose 671  Date & Time Notied:  11/24/2018 1748  Provider Notified: Dr. Petra Kuba

## 2018-11-24 NOTE — Progress Notes (Signed)
Patient to 4E room 12 at this time. Telemetry applied and CCMD notified. V/s and assessment complete. CHG bath done. Patient asleep but easily aroused with stimuli. Patient non verbal. Will continue to monitor.

## 2018-11-24 NOTE — Progress Notes (Signed)
ANTICOAGULATION CONSULT NOTE - Initial Consult  Pharmacy Consult for lovenox Indication: hx DVT  Patient Measurements: Height: 5\' 2"  (157.5 cm) Weight: 109 lb 5.6 oz (49.6 kg) IBW/kg (Calculated) : 50.1  Vital Signs: BP: 132/87 (05/02 1430) Pulse Rate: 95 (05/02 1430)  Labs: Recent Labs    11/24/18 1317 11/24/18 1510  HGB 14.8 12.9  HCT 49.0* 38.0  PLT 301  --   CREATININE 2.90*  --     Estimated Creatinine Clearance: 10.5 mL/min (A) (by C-G formula based on SCr of 2.9 mg/dL (H)).  Assessment: 27 yof presented to the ED with AMS. She is on chronic long-term prophylactic xarelto for a history of DVT. CBC is WNL and no bleeding noted. Pt is NPO so will plan to switch her to lovenox for now. Home dose of xarelto is a prophylactic dose so will do prophylactic lovenox dosing while pt is NPO. Pt also is small with an AKI so she will require close monitoring for bleeding.   Goal of Therapy:  VTE prevention Monitor platelets by anticoagulation protocol: Yes   Plan:  Lovenox 30mg  SQ Q24H starting tonight CBC Q72H Monitor renal function and signs of bleeding  Sharon Butler, Drake Leach 11/24/2018,4:24 PM

## 2018-11-24 NOTE — Progress Notes (Signed)
Pt BG is 352, MD on call notified. insuline drip rate changed to 11.7. Per MD order will hold the D5 and continue running the NS until the BG is less than 250. BMET is schedule Q4 and troponin Q6. We'll continue to monitor.

## 2018-11-24 NOTE — Progress Notes (Addendum)
CRITICAL VALUE ALERT  Critical Value:  Troponin 0.06  Date & Time Notied:  11/24/2018 1748  Provider Notified: Dr. Petra Kuba

## 2018-11-24 NOTE — H&P (Signed)
Date: 11/24/2018               Patient Name:  Sharon Butler MRN: [id number]  DOB: October 21, 1928 Age / Sex: 85 y.o., female   PCP: Burna Cash, MD         Medical Service: Internal Medicine Teaching Service         Attending Physician: Dr. Anne Shutter, MD    First Contact: Dr. Petra Kuba Pager: [phone]  Second Contact: Dr. Evelene Croon Pager: [phone]       After Hours (After 5p/  First Contact Pager: [phone]  weekends / holidays): Second Contact Pager: [phone]   Chief Complaint: AMS  History of Present Illness: 89yoF with alzheimer's dementia (mostly bed bound, nonsensical conversation), copd, t2dm presenting with one week of worsening mental status and hyperglycemia. History was limited as patient is somnolent and not following commands or speaking.   I spoke with the daughter over the phone and she states she hadn't been feeling well or eating for the past week. The daughter is the primary care taker and normal administer's her mom's insulin. However, she has had conjunctivitis for the past week and a half and has been keeping her distance from her mom. Therefore, the patient has not been receiving any insulin for the last 10 days. The patient lives with her husband who states that she fell 4 days ago and has been having difficulty sleeping. Today, she had four episodes of "oreo cookie like" emesis and was very somnolent. The palliative care nurse came out to the house and they gave her a bath which helped perk her up a bit but she had another episode of vomiting and the nurse advised that the patient go to the ED.   Denies diarrhea, cough, fevers  +constipation    Meds:  No current facility-administered medications on file prior to encounter.    Current Outpatient Medications on File Prior to Encounter  Medication Sig Dispense Refill  . diphenhydramine-acetaminophen (TYLENOL PM) 25-500 MG TABS tablet Take 0.5-1 tablets by mouth at bedtime as needed (sleep and pain).     . feeding supplement, GLUCERNA SHAKE, (GLUCERNA SHAKE) LIQD Take 237 mLs by mouth 2 (two) times daily between meals. (Patient taking differently: Take 237 mLs by mouth See admin instructions. ) 60 Can 2  . insulin detemir (LEVEMIR) 100 UNIT/ML injection Inject 0.35 mLs (35 Units total) into the skin at bedtime. Can increase by 10% every week if blood sugars remain consistently over 200 (Patient taking differently: Inject 35-50 Units into the skin daily. Can increase by 10% every week if blood sugars remain consistently over 200.) 30 mL 1  . XARELTO 10 MG TABS tablet TAKE 1 TABLET BY MOUTH EVERY DAY (Patient taking differently: Take 10 mg by mouth daily. ) 90 tablet 3  . ACCU-CHEK GUIDE test strip CHECK BLOOD SUGAR UP TO 2 TIMES A DAY AS INSTRUCTED Dx code E11.65 100 each 10  . BD VEO INSULIN SYRINGE U/F 31G X 15/64" 0.3 ML MISC USE TO ADMINISTER INSULIN AS DIRECTED. 100 each 2  . Insulin Pen Needle 31G X 6 MM MISC Use to inject 35 units of Lantus once a day in the morning. 100 each 3    Current Meds  Medication Sig  . diphenhydramine-acetaminophen (TYLENOL PM) 25-500 MG TABS tablet Take 0.5-1 tablets by mouth at bedtime as needed (sleep and pain).  . feeding supplement, GLUCERNA SHAKE, (GLUCERNA SHAKE) LIQD Take 237 mLs by mouth 2 (two) times daily between  meals. (Patient taking differently: Take 237 mLs by mouth See admin instructions. )  . insulin detemir (LEVEMIR) 100 UNIT/ML injection Inject 0.35 mLs (35 Units total) into the skin at bedtime. Can increase by 10% every week if blood sugars remain consistently over 200 (Patient taking differently: Inject 35-50 Units into the skin daily. Can increase by 10% every week if blood sugars remain consistently over 200.)  . XARELTO 10 MG TABS tablet TAKE 1 TABLET BY MOUTH EVERY DAY (Patient taking differently: Take 10 mg by mouth daily. )     Allergies: Allergies as of 11/24/2018 - Review Complete 11/24/2018  Allergen Reaction Noted  . Penicillins  Anaphylaxis 06/15/2016  . Codeine Hives and Itching 06/15/2016  . Other Other (See Comments) 07/05/2016  . Shellfish allergy Hives 06/15/2016   Past Medical History:  Diagnosis Date  . Alzheimer's dementia (HCC)    "dx'd 05/2015; final stages" (06/15/2016)  . Complication of anesthesia    "problems waking up from gallbladder OR in 1978"  . COPD (chronic obstructive pulmonary disease) (HCC)   . High cholesterol   . History of blood transfusion 1996   "related to appendix OR"  . Infected surgical wound 07/2016   rt hip  . Type II diabetes mellitus (HCC) 1998    Family History: No family history on file.    Social History:  Social History   Socioeconomic History  . Marital status: Married    Spouse name: Not on file  . Number of children: Not on file  . Years of education: Not on file  . Highest education level: Not on file  Occupational History  . Not on file  Social Needs  . Financial resource strain: Not on file  . Food insecurity:    Worry: Not on file    Inability: Not on file  . Transportation needs:    Medical: Not on file    Non-medical: Not on file  Tobacco Use  . Smoking status: Never Smoker  . Smokeless tobacco: Never Used  Substance and Sexual Activity  . Alcohol use: No  . Drug use: No  . Sexual activity: Not on file  Lifestyle  . Physical activity:    Days per week: Not on file    Minutes per session: Not on file  . Stress: Not on file  Relationships  . Social connections:    Talks on phone: Not on file    Gets together: Not on file    Attends religious service: Not on file    Active member of club or organization: Not on file    Attends meetings of clubs or organizations: Not on file    Relationship status: Not on file  . Intimate partner violence:    Fear of current or ex partner: Not on file    Emotionally abused: Not on file    Physically abused: Not on file    Forced sexual activity: Not on file  Other Topics Concern  . Not on file   Social History Narrative  . Not on file     Review of Systems: Unable to obtain due to AMS  Physical Exam: Blood pressure (!) 153/74, pulse 93, temperature 97.9 F (36.6 C), temperature source Axillary, resp. rate 17, height  (1.575 m), weight 49.6 kg, SpO2 91 %. Gen: elderly, chronically ill appearing malnourished female, somnolent, not following commands, NAD HENT: dry MM, temporal wasting, PERRL Cardiac: RRR, no m/r/g, no jvd Pulm: ctab, normal wob on RA Abd; soft, NT,  ND +BS Ext: warm, +DP pulses and radial pulses bilaterally  Skin: no skin breakdown Neuro: not following commands. Will open eyes to verbal stimuli but does not speak. No facial droop. Withdrawals all extremities to pain  EKG: personally reviewed my interpretation is sinus tach 103 bpm, RBBB similar to prior   CXR: personally reviewed my interpretation is cardiomegaly, minimal opacity in left lung base favors atelectasis, no effusions   Assessment & Plan by Problem: Active Problems:   DKA (diabetic ketoacidoses) (HCC)  89yoF with alzheimer's dementia (mostly bed bound, nonsensical conversation), copd, t2dm presenting with one week of worsening mental status and hyperglycemia in the setting of not taking insulin for 10 days. Found to be in DKA  AMS: most likely 2/2 DKA but infection is also possible given slight leukocytosis to 12.1 and UA with trace leukocytes and rare bacteria. Will f/u culture. Afebrile, VSS. No evidence of skin breakdown on exam. Recent vomiting and high risk for aspiration pneumonia but lungs sound clear and no overt signs of pneumonia on CXR (slight opacity in LLL more consistent with atelectasis). Doubt stroke given non-focal neuro exam.   - f/u urine and blood cultures - repeat cbc tomorrow - is respiratory status worsens, repeat CXR and consider unasyn for aspiration pneumonia  - neuro checks q2h  DKA 2/2 insulin non-compliance, T2DM: glucose 970, co2 14, AG 27, pH 7.2 - most  likely 2/2 insulin non-compliance but will check troponin and trend if elevated. Doubt stroke given non-focal neuro exam. If she is not improved tomorrow, can consider head CT at that time.  - repeat ekg in the morning  - insulin ggt - NS IVFs 1L bolus followed by 100cc/hr, switch to D5-1/2NS when CBG < 250 - q1h CBGs  AKI: creatine 2.9 from baseline of 0.9 ten months ago. Likely pre-renal in the setting of decreased po intake and uncontrolled diabetes - IVFs - trend creatine   Hypernatremia: Na 160 after correction for hyperglycemia. Likely 2/2 decreased po intake - IVFs   Alzheimer's dementia: end stages, follows with palliative  - malnourished, give daily thiamine and folate   DVT ppx: lovenox Code: DNR, confirmed with daughter Diet: npo until mental status improves   Dispo: Admit patient to Inpatient with expected length of stay greater than 2 midnights.  Signed: Ali LoweVogel, Jolea Dolle S, MD 11/24/2018, 5:36 PM  Pager: [phone]

## 2018-11-24 NOTE — ED Provider Notes (Signed)
[facility] Provider Note   CSN: [id number] Arrival date & time: 11/24/18  1239    History   Chief Complaint Chief Complaint  Patient presents with  . Altered Mental Status    HPI Sharon Butler is a 85 y.o. female.     HPI Patient presents to the emergency department with evaded blood sugar.  The patient was brought in by EMS after getting more more altered over the last week.  The family was given the report to EMS.  Patient was not able to give any history.  Patient will open her eyes.  Patient is a poorly controlled diabetic. Past Medical History:  Diagnosis Date  . Alzheimer's dementia (HCC)    "dx'd 05/2015; final stages" (06/15/2016)  . Complication of anesthesia    "problems waking up from gallbladder OR in 1978"  . COPD (chronic obstructive pulmonary disease) (HCC)   . High cholesterol   . History of blood transfusion 1996   "related to appendix OR"  . Infected surgical wound 07/2016   rt hip  . Type II diabetes mellitus (HCC) 1998    Patient Active Problem List   Diagnosis Date Noted  . Fall 09/30/2018  . Recurrent UTI 05/20/2017  . History of deep venous thrombosis (DVT) of distal vein of left lower extremity 02/09/2017  . Difficulty in walking, not elsewhere classified   . Malnutrition of moderate degree 08/04/2016  . Poorly controlled type 2 diabetes mellitus (HCC)   . Dementia (HCC) 07/05/2016    Past Surgical History:  Procedure Laterality Date  . ANTERIOR APPROACH HEMI HIP ARTHROPLASTY Right 07/07/2016   Procedure: ANTERIOR APPROACH HEMI HIP ARTHROPLASTY;  Surgeon: Samson FredericBrian Swinteck, MD;  Location: [facility] OR;  Service: Orthopedics;  Laterality: Right;  . APPENDECTOMY  1986  . CATARACT EXTRACTION W/ INTRAOCULAR LENS  IMPLANT, BILATERAL Bilateral   . CHOLECYSTECTOMY  1978  . DILATION AND CURETTAGE OF UTERUS    . INCISION AND DRAINAGE HIP Right 08/05/2016   Procedure: IRRIGATION AND DEBRIDEMENT HIP;  Surgeon: Samson FredericBrian  Swinteck, MD;  Location: [facility] OR;  Service: Orthopedics;  Laterality: Right;  . IRRIGATION AND DEBRIDEMENT ABSCESS Right 08/05/2016   Procedure: IRRIGATION AND DEBRIDEMENT LABIAL ABSCESS;  Surgeon: Manus RuddMatthew Tsuei, MD;  Location: [facility] OR;  Service: General;  Laterality: Right;     OB History   No obstetric history on file.      Home Medications    Prior to Admission medications   Medication Sig Start Date End Date Taking? Authorizing Provider  ACCU-CHEK GUIDE test strip CHECK BLOOD SUGAR UP TO 2 TIMES A DAY AS INSTRUCTED Dx code E11.65 07/21/17   Burna CashSantos-Sanchez, Idalys, MD  BD VEO INSULIN SYRINGE U/F 31G X 15/64" 0.3 ML MISC USE TO ADMINISTER INSULIN AS DIRECTED. 06/08/18   Santos-Sanchez, Chelsea PrimusIdalys, MD  CINNAMON PO Take 2 tablets by mouth 2 (two) times daily.     [provider]  feeding supplement, GLUCERNA SHAKE, (GLUCERNA SHAKE) LIQD Take 237 mLs by mouth 2 (two) times daily between meals. Patient taking differently: Take 237 mLs by mouth See admin instructions.  06/17/16   Nyra MarketSvalina, Gorica, MD  insulin detemir (LEVEMIR) 100 UNIT/ML injection Inject 0.35 mLs (35 Units total) into the skin at bedtime. Can increase by 10% every week if blood sugars remain consistently over 200 11/01/18   Inez CatalinaMullen, Emily B, MD  Insulin Pen Needle 31G X 6 MM MISC Use to inject 35 units of Lantus once a day in the morning. 07/07/17  Burna Cash, MD  XARELTO 10 MG TABS tablet TAKE 1 TABLET BY MOUTH EVERY DAY 08/27/18   Burna Cash, MD    Family History No family history on file.  Social History Social History   Tobacco Use  . Smoking status: Never Smoker  . Smokeless tobacco: Never Used  Substance Use Topics  . Alcohol use: No  . Drug use: No     Allergies   Penicillins; Codeine; Other; and Shellfish allergy   Review of Systems Review of Systems Level 5 caveat applies due to altered mental status  Physical Exam Updated Vital Signs BP 132/87   Pulse 95   Resp 17   SpO2  94%   Physical Exam Vitals signs and nursing note reviewed.  Constitutional:      Appearance: She is well-developed. She is ill-appearing.  HENT:     Head: Normocephalic and atraumatic.  Eyes:     Pupils: Pupils are equal, round, and reactive to light.  Neck:     Musculoskeletal: Normal range of motion and neck supple.  Cardiovascular:     Rate and Rhythm: Normal rate and regular rhythm.     Heart sounds: Normal heart sounds. No murmur. No friction rub. No gallop.   Pulmonary:     Effort: Pulmonary effort is normal. No respiratory distress.     Breath sounds: Normal breath sounds. No wheezing.  Abdominal:     General: Bowel sounds are normal. There is no distension.     Palpations: Abdomen is soft.     Tenderness: There is no abdominal tenderness.  Skin:    General: Skin is warm and dry.     Capillary Refill: Capillary refill takes less than 2 seconds.     Findings: No erythema or rash.  Neurological:     Mental Status: She is alert.     Motor: No abnormal muscle tone.     Coordination: Coordination normal.  Psychiatric:        Behavior: Behavior normal.      ED Treatments / Results  Labs (all labs ordered are listed, but only abnormal results are displayed) Labs Reviewed  COMPREHENSIVE METABOLIC PANEL - Abnormal; Notable for the following components:      Result Value   Sodium 147 (*)    CO2 14 (*)    Glucose, Bld 970 (*)    BUN 79 (*)    Creatinine, Ser 2.90 (*)    Calcium 10.4 (*)    Albumin 3.4 (*)    Total Bilirubin 1.3 (*)    GFR calc non Af Amer 14 (*)    GFR calc Af Amer 16 (*)    Anion gap 27 (*)    All other components within normal limits  CBC - Abnormal; Notable for the following components:   WBC 12.1 (*)    HCT 49.0 (*)    MCV 101.2 (*)    All other components within normal limits  CBG MONITORING, ED - Abnormal; Notable for the following components:   Glucose-Capillary >600 (*)    All other components within normal limits  CBG MONITORING, ED  - Abnormal; Notable for the following components:   Glucose-Capillary >600 (*)    All other components within normal limits  SARS CORONAVIRUS 2 (HOSPITAL ORDER, PERFORMED IN [facility])  URINALYSIS, ROUTINE W REFLEX MICROSCOPIC  I-STAT VENOUS BLOOD GAS, ED    EKG EKG Interpretation  Date/Time:  Saturday Nov 24 2018 13:11:35 EDT Ventricular Rate:  103 PR Interval:  QRS Duration: 137 QT Interval:  454 QTC Calculation: 595 R Axis:   -6 Text Interpretation:  Sinus tachycardia Right bundle branch block Minimal ST elevation, inferior leads Confirmed by Raeford Razor [id number]) on 11/24/2018 1:35:50 PM   Radiology No results found.  Procedures Procedures (including critical care time)  Medications Ordered in ED Medications  sodium chloride flush (NS) 0.9 % injection 3 mL (has no administration in time range)  dextrose 5 %-0.45 % sodium chloride infusion (has no administration in time range)  insulin regular, human (MYXREDLIN) 100 units/ 100 mL infusion (5.4 Units/hr Intravenous New Bag/Given 11/24/18 1442)  potassium chloride 10 mEq in 100 mL IVPB (has no administration in time range)  lactated ringers bolus 2,000 mL (2,000 mLs Intravenous New Bag/Given 11/24/18 1452)     Initial Impression / Assessment and Plan / ED Course  I have reviewed the triage vital signs and the nursing notes.  Pertinent labs & imaging results that were available during my care of the patient were reviewed by me and considered in my medical decision making (see chart for details).        CRITICAL CARE Performed by: Jamesetta Orleans Oskar Cretella Total critical care time: 40 minutes Critical care time was exclusive of separately billable procedures and treating other patients. Critical care was necessary to treat or prevent imminent or life-threatening deterioration. Critical care was time spent personally by me on the following activities: development of treatment plan with patient and/or surrogate  as well as nursing, discussions with consultants, evaluation of patient's response to treatment, examination of patient, obtaining history from patient or surrogate, ordering and performing treatments and interventions, ordering and review of laboratory studies, ordering and review of radiographic studies, pulse oximetry and re-evaluation of patient's condition.   Final Clinical Impressions(s) / ED Diagnoses   Final diagnoses:  None    ED Discharge Orders    None       Charlestine Night, PA-C 11/24/18 1459    Raeford Razor, MD 11/25/18 1105

## 2018-11-24 NOTE — ED Triage Notes (Signed)
Pt arrives AMS x7 days, steadily getting worse per family. Dementia at baseline, but usually looks around and talks to family. Sugar read HI, HI on CBG meter on arrival as well. On arrival vomitted dark brown emisis. [facility] reported that family believed she may have vomitted for them. Afebrile, no notable cough or SOB, 97% on RA. Contact or travel unknown due to pt being unable to answer questions

## 2018-11-24 NOTE — Progress Notes (Signed)
Talk to patient's daughter Stanton Kidney for an update, when I put the phone on the patient's ears, she started to blink her eyes which I took as a sign of response to her daughter's voice and I reported to the daughter at the other end of the line. But, the daughter said that if "she is blinking her eyes a lot, it is because her eyes are hurting". The daughter said she uses VISIN, and Refresh Tears over the counter eye drops to help with the pain. Patient in now alert and respond to stimuli but non verbal. Pt's daughter informed us that when alert and in her normal state, she can be agitated and may start pulling the tubes and lines. MD will be notified. We'll continue to monitor.

## 2018-11-25 DIAGNOSIS — L8990 Pressure ulcer of unspecified site, unspecified stage: Secondary | ICD-10-CM | POA: Diagnosis present

## 2018-11-25 DIAGNOSIS — E1110 Type 2 diabetes mellitus with ketoacidosis without coma: Principal | ICD-10-CM

## 2018-11-25 DIAGNOSIS — Z9114 Patient's other noncompliance with medication regimen: Secondary | ICD-10-CM

## 2018-11-25 DIAGNOSIS — N179 Acute kidney failure, unspecified: Secondary | ICD-10-CM

## 2018-11-25 DIAGNOSIS — Z794 Long term (current) use of insulin: Secondary | ICD-10-CM

## 2018-11-25 DIAGNOSIS — L89159 Pressure ulcer of sacral region, unspecified stage: Secondary | ICD-10-CM

## 2018-11-25 DIAGNOSIS — E870 Hyperosmolality and hypernatremia: Secondary | ICD-10-CM | POA: Diagnosis present

## 2018-11-25 DIAGNOSIS — F0280 Dementia in other diseases classified elsewhere without behavioral disturbance: Secondary | ICD-10-CM

## 2018-11-25 DIAGNOSIS — J9811 Atelectasis: Secondary | ICD-10-CM

## 2018-11-25 DIAGNOSIS — Z7401 Bed confinement status: Secondary | ICD-10-CM

## 2018-11-25 DIAGNOSIS — Z880 Allergy status to penicillin: Secondary | ICD-10-CM

## 2018-11-25 DIAGNOSIS — I4510 Unspecified right bundle-branch block: Secondary | ICD-10-CM

## 2018-11-25 DIAGNOSIS — K5900 Constipation, unspecified: Secondary | ICD-10-CM

## 2018-11-25 DIAGNOSIS — Z66 Do not resuscitate: Secondary | ICD-10-CM

## 2018-11-25 DIAGNOSIS — Z91013 Allergy to seafood: Secondary | ICD-10-CM

## 2018-11-25 DIAGNOSIS — Z885 Allergy status to narcotic agent status: Secondary | ICD-10-CM

## 2018-11-25 DIAGNOSIS — E876 Hypokalemia: Secondary | ICD-10-CM | POA: Diagnosis present

## 2018-11-25 DIAGNOSIS — G309 Alzheimer's disease, unspecified: Secondary | ICD-10-CM

## 2018-11-25 DIAGNOSIS — J449 Chronic obstructive pulmonary disease, unspecified: Secondary | ICD-10-CM

## 2018-11-25 LAB — BASIC METABOLIC PANEL
Anion gap: 12 (ref 5–15)
Anion gap: 9 (ref 5–15)
Anion gap: 7 (ref 5–15)
Anion gap: 8 (ref 5–15)
BUN: 56 mg/dL — ABNORMAL HIGH (ref 8–23)
BUN: 53 mg/dL — ABNORMAL HIGH (ref 8–23)
BUN: 49 mg/dL — ABNORMAL HIGH (ref 8–23)
BUN: 41 mg/dL — ABNORMAL HIGH (ref 8–23)
CO2: 22 mmol/L (ref 22–32)
CO2: 22 mmol/L (ref 22–32)
CO2: 23 mmol/L (ref 22–32)
CO2: 22 mmol/L (ref 22–32)
Calcium: 9.9 mg/dL (ref 8.9–10.3)
Calcium: 9.5 mg/dL (ref 8.9–10.3)
Calcium: 9.6 mg/dL (ref 8.9–10.3)
Calcium: 9.2 mg/dL (ref 8.9–10.3)
Chloride: 124 mmol/L — ABNORMAL HIGH (ref 98–111)
Chloride: 125 mmol/L — ABNORMAL HIGH (ref 98–111)
Chloride: 123 mmol/L — ABNORMAL HIGH (ref 98–111)
Chloride: 121 mmol/L — ABNORMAL HIGH (ref 98–111)
Creatinine, Ser: 1.39 mg/dL — ABNORMAL HIGH (ref 0.44–1.00)
Creatinine, Ser: 1.26 mg/dL — ABNORMAL HIGH (ref 0.44–1.00)
Creatinine, Ser: 1.22 mg/dL — ABNORMAL HIGH (ref 0.44–1.00)
Creatinine, Ser: 1.02 mg/dL — ABNORMAL HIGH (ref 0.44–1.00)
GFR calc Af Amer: 39 mL/min — ABNORMAL LOW (ref >60)
GFR calc Af Amer: 44 mL/min — ABNORMAL LOW (ref >60)
GFR calc Af Amer: 46 mL/min — ABNORMAL LOW (ref >60)
GFR calc Af Amer: 57 mL/min — ABNORMAL LOW (ref >60)
GFR calc non Af Amer: 34 mL/min — ABNORMAL LOW (ref >60)
GFR calc non Af Amer: 38 mL/min — ABNORMAL LOW (ref >60)
GFR calc non Af Amer: 40 mL/min — ABNORMAL LOW (ref >60)
GFR calc non Af Amer: 49 mL/min — ABNORMAL LOW (ref >60)
Glucose, Bld: 134 mg/dL — ABNORMAL HIGH (ref 70–99)
Glucose, Bld: 127 mg/dL — ABNORMAL HIGH (ref 70–99)
Glucose, Bld: 145 mg/dL — ABNORMAL HIGH (ref 70–99)
Glucose, Bld: 195 mg/dL — ABNORMAL HIGH (ref 70–99)
Potassium: 3.7 mmol/L (ref 3.5–5.1)
Potassium: 4.1 mmol/L (ref 3.5–5.1)
Potassium: 3.9 mmol/L (ref 3.5–5.1)
Potassium: 4.3 mmol/L (ref 3.5–5.1)
Sodium: 158 mmol/L — ABNORMAL HIGH (ref 135–145)
Sodium: 156 mmol/L — ABNORMAL HIGH (ref 135–145)
Sodium: 153 mmol/L — ABNORMAL HIGH (ref 135–145)
Sodium: 151 mmol/L — ABNORMAL HIGH (ref 135–145)

## 2018-11-25 LAB — CBC
HCT: 45.2 % (ref 36.0–46.0)
Hemoglobin: 14.8 g/dL (ref 12.0–15.0)
MCH: 30.1 pg (ref 26.0–34.0)
MCHC: 32.7 g/dL (ref 30.0–36.0)
MCV: 92.1 fL (ref 80.0–100.0)
Platelets: 226 10*3/uL (ref 150–400)
RBC: 4.91 MIL/uL (ref 3.87–5.11)
RDW: 14.4 % (ref 11.5–15.5)
WBC: 9.7 10*3/uL (ref 4.0–10.5)
nRBC: 0 % (ref 0.0–0.2)

## 2018-11-25 LAB — GLUCOSE, CAPILLARY
Glucose-Capillary: 103 mg/dL — ABNORMAL HIGH (ref 70–99)
Glucose-Capillary: 106 mg/dL — ABNORMAL HIGH (ref 70–99)
Glucose-Capillary: 108 mg/dL — ABNORMAL HIGH (ref 70–99)
Glucose-Capillary: 115 mg/dL — ABNORMAL HIGH (ref 70–99)
Glucose-Capillary: 120 mg/dL — ABNORMAL HIGH (ref 70–99)
Glucose-Capillary: 121 mg/dL — ABNORMAL HIGH (ref 70–99)
Glucose-Capillary: 128 mg/dL — ABNORMAL HIGH (ref 70–99)
Glucose-Capillary: 131 mg/dL — ABNORMAL HIGH (ref 70–99)
Glucose-Capillary: 131 mg/dL — ABNORMAL HIGH (ref 70–99)
Glucose-Capillary: 134 mg/dL — ABNORMAL HIGH (ref 70–99)
Glucose-Capillary: 180 mg/dL — ABNORMAL HIGH (ref 70–99)
Glucose-Capillary: 80 mg/dL (ref 70–99)
Glucose-Capillary: 91 mg/dL (ref 70–99)

## 2018-11-25 LAB — TROPONIN I: Troponin I: 0.06 ng/mL (ref <0.03)

## 2018-11-25 MED ORDER — DEXTROSE 5 % IV SOLN
INTRAVENOUS | Status: DC
  Administered 2018-11-25: 11:00:00 via INTRAVENOUS

## 2018-11-25 MED ORDER — DEXTROSE-NACL 5-0.45 % IV SOLN: INTRAVENOUS | Status: DC

## 2018-11-25 MED ORDER — INSULIN ASPART 100 UNIT/ML ~~LOC~~ SOLN
0.0000 [IU] | Freq: Three times a day (TID) | SUBCUTANEOUS | Status: DC
  Administered 2018-11-25 – 2018-11-26 (×2): 2 [IU] via SUBCUTANEOUS
  Administered 2018-11-26: 07:00:00 1 [IU] via SUBCUTANEOUS
  Administered 2018-11-26: 2 [IU] via SUBCUTANEOUS
  Administered 2018-11-27: 06:00:00 7 [IU] via SUBCUTANEOUS
  Administered 2018-11-27 (×2): 3 [IU] via SUBCUTANEOUS
  Administered 2018-11-28: 11:00:00 5 [IU] via SUBCUTANEOUS

## 2018-11-25 MED ORDER — INSULIN ASPART 100 UNIT/ML ~~LOC~~ SOLN
0.0000 [IU] | Freq: Every day | SUBCUTANEOUS | Status: DC
  Administered 2018-11-26: 22:00:00 4 [IU] via SUBCUTANEOUS

## 2018-11-25 MED ORDER — SODIUM CHLORIDE 0.45 % IV SOLN
INTRAVENOUS | Status: DC
  Administered 2018-11-25 – 2018-11-27 (×5): via INTRAVENOUS

## 2018-11-25 NOTE — Progress Notes (Signed)
Insulin drip on hold per GlucoStabilizer parameters.

## 2018-11-25 NOTE — Progress Notes (Signed)
Updated Dr. Petra Kuba, MD  (Internal Medicine Resident) of elevated B/P and daughter's concern about eye irritation of right eye. Provider made aware and will continue to monitor.

## 2018-11-25 NOTE — Progress Notes (Signed)
   Subjective: Appears better today. More alert and responsive. Opens eyes to verbal command, nods to questions.   Objective:  Vital signs in last 24 hours: Vitals:   11/25/18 0000 11/25/18 0200 11/25/18 0400 11/25/18 0800  BP: 124/70  (!) 171/64 (!) 167/70  Pulse: 74 68 72 73  Resp: 16 17 (!) 21 20  Temp: (!) 97.5 F (36.4 C)   98.5 F (36.9 C)  TempSrc: Axillary   Oral  SpO2: 94% 94% 93% 96%  Weight:      Height:       Gen: laying in bed, NAD Abd: soft, NT, ND, +BS Pulm: CTAB Neuro: opens eyes to name, weak grip strength on command  Assessment/Plan:  Active Problems:   DKA (diabetic ketoacidoses) (HCC)   Pressure injury of skin   89yoF with alzheimer's dementia (mostly bed bound, nonsensical conversation), copd, t2dm presenting with one week of worsening mental status and hyperglycemia in the setting of not taking insulin for 10 days. Found to be in DKA and also hypernatremic   AMS: improving. Opens eyes to verbal stimuli and follows some commands. Still not back to baseline.  - BCx NG@<24h - UCx pending - is respiratory status worsens, repeat CXR and consider unasyn for aspiration pneumonia  - neuro checks q2h - correct hypernatremia   DKA 2/2 insulin non-compliance, T2DM: resolved - transition to subc insulin, CBGs not that elevated. Will start with SSI and monitor CBGs.  - still on D5W for hypernatremia. If sugars are not well controlled with SSI, can start give 10U lantus   AKI: improving - creatine 2.20 on admission, now 1.26 this morning. Baseline was 0.90 - IVFs - trend creatine   Hypernatremia: Improving - continue D5W at 100cc/hr - q4h bmp - goal Na 150 by 11pm   Alzheimer's dementia: end stages, follows with palliative  - malnourished, daily thiamine and folate   Pressure ulcer: sacral ulcer - frequent turns - protective dressing   DVT ppx: lovenox Code: DNR, confirmed with daughter Diet: npo until mental status improves   Dispo:  Anticipated discharge in approximately 3  day(s).   Ali Lowe, MD 11/25/2018, 9:59 AM Pager: [phone]

## 2018-11-25 NOTE — Progress Notes (Signed)
  Date: 11/25/2018  Patient name: Sharon Butler  Medical record number: [id number]  Date of birth: 1929/03/14   I have seen and evaluated this patient and I have discussed the plan of care with the house staff. Please see their note for complete details. I concur with their findings with the following additions/corrections:   Please see my separate attestation of the H&P from 11/24/2018.  Jessy Oto, M.D., Ph.D. 11/25/2018, 12:02 PM

## 2018-11-26 ENCOUNTER — Inpatient Hospital Stay (HOSPITAL_COMMUNITY): Payer: Medicare HMO

## 2018-11-26 DIAGNOSIS — R339 Retention of urine, unspecified: Secondary | ICD-10-CM

## 2018-11-26 DIAGNOSIS — E118 Type 2 diabetes mellitus with unspecified complications: Secondary | ICD-10-CM

## 2018-11-26 DIAGNOSIS — E46 Unspecified protein-calorie malnutrition: Secondary | ICD-10-CM

## 2018-11-26 DIAGNOSIS — Z79899 Other long term (current) drug therapy: Secondary | ICD-10-CM

## 2018-11-26 DIAGNOSIS — K0889 Other specified disorders of teeth and supporting structures: Secondary | ICD-10-CM

## 2018-11-26 DIAGNOSIS — I10 Essential (primary) hypertension: Secondary | ICD-10-CM

## 2018-11-26 DIAGNOSIS — R4182 Altered mental status, unspecified: Secondary | ICD-10-CM

## 2018-11-26 LAB — URINE CULTURE

## 2018-11-26 LAB — BASIC METABOLIC PANEL
Anion gap: 7 (ref 5–15)
Anion gap: 9 (ref 5–15)
BUN: 27 mg/dL — ABNORMAL HIGH (ref 8–23)
BUN: 17 mg/dL (ref 8–23)
CO2: 23 mmol/L (ref 22–32)
CO2: 25 mmol/L (ref 22–32)
Calcium: 8.8 mg/dL — ABNORMAL LOW (ref 8.9–10.3)
Calcium: 8.4 mg/dL — ABNORMAL LOW (ref 8.9–10.3)
Chloride: 118 mmol/L — ABNORMAL HIGH (ref 98–111)
Chloride: 108 mmol/L (ref 98–111)
Creatinine, Ser: 0.82 mg/dL (ref 0.44–1.00)
Creatinine, Ser: 0.7 mg/dL (ref 0.44–1.00)
GFR calc Af Amer: >60 mL/min (ref >60)
GFR calc Af Amer: >60 mL/min (ref >60)
GFR calc non Af Amer: >60 mL/min (ref >60)
GFR calc non Af Amer: >60 mL/min (ref >60)
Glucose, Bld: 156 mg/dL — ABNORMAL HIGH (ref 70–99)
Glucose, Bld: 210 mg/dL — ABNORMAL HIGH (ref 70–99)
Potassium: 3.9 mmol/L (ref 3.5–5.1)
Potassium: 3 mmol/L — ABNORMAL LOW (ref 3.5–5.1)
Sodium: 148 mmol/L — ABNORMAL HIGH (ref 135–145)
Sodium: 142 mmol/L (ref 135–145)

## 2018-11-26 LAB — GLUCOSE, CAPILLARY
Glucose-Capillary: 149 mg/dL — ABNORMAL HIGH (ref 70–99)
Glucose-Capillary: 170 mg/dL — ABNORMAL HIGH (ref 70–99)
Glucose-Capillary: 166 mg/dL — ABNORMAL HIGH (ref 70–99)
Glucose-Capillary: 316 mg/dL — ABNORMAL HIGH (ref 70–99)

## 2018-11-26 MED ORDER — POTASSIUM CHLORIDE 10 MEQ/100ML IV SOLN
10.0000 meq | INTRAVENOUS | Status: AC
  Administered 2018-11-26 (×3): 10 meq via INTRAVENOUS
  Filled 2018-11-26: qty 100

## 2018-11-26 MED ORDER — ENSURE ENLIVE PO LIQD: 237.0000 mL | Freq: Three times a day (TID) | ORAL | Status: DC

## 2018-11-26 MED ORDER — HYDRALAZINE HCL 20 MG/ML IJ SOLN: 2.0000 mg | INTRAMUSCULAR | Status: DC | PRN

## 2018-11-26 MED ORDER — ENSURE ENLIVE PO LIQD
237.0000 mL | Freq: Three times a day (TID) | ORAL | Status: DC
  Administered 2018-11-26 (×2): 237 mL via ORAL

## 2018-11-26 NOTE — Progress Notes (Signed)
Pt's daughter was called in order to gain information regarding pt's baseline functionality.  Pt was able to speak with her daughter and her husband at this time.  Will continue to monitor.

## 2018-11-26 NOTE — Evaluation (Signed)
Clinical/Bedside Swallow Evaluation Patient Details  Name: Fabio BeringRamona Grade MRN: [id number] Date of Birth: [birth year]  Today's Date: 11/26/2018 Time: SLP Start Time (ACUTE ONLY): 1450 SLP Stop Time (ACUTE ONLY): 1506 SLP Time Calculation (min) (ACUTE ONLY): 16 min  Past Medical History:  Past Medical History:  Diagnosis Date  . Alzheimer's dementia (HCC)    "dx'd 05/2015; final stages" (06/15/2016)  . Complication of anesthesia    "problems waking up from gallbladder OR in 1978"  . COPD (chronic obstructive pulmonary disease) (HCC)   . High cholesterol   . History of blood transfusion 1996   "related to appendix OR"  . Infected surgical wound 07/2016   rt hip  . Type II diabetes mellitus (HCC) 1998   Past Surgical History:  Past Surgical History:  Procedure Laterality Date  . ANTERIOR APPROACH HEMI HIP ARTHROPLASTY Right 07/07/2016   Procedure: ANTERIOR APPROACH HEMI HIP ARTHROPLASTY;  Surgeon: Samson FredericBrian Swinteck, MD;  Location: [facility] OR;  Service: Orthopedics;  Laterality: Right;  . APPENDECTOMY  1986  . CATARACT EXTRACTION W/ INTRAOCULAR LENS  IMPLANT, BILATERAL Bilateral   . CHOLECYSTECTOMY  1978  . DILATION AND CURETTAGE OF UTERUS    . INCISION AND DRAINAGE HIP Right 08/05/2016   Procedure: IRRIGATION AND DEBRIDEMENT HIP;  Surgeon: Samson FredericBrian Swinteck, MD;  Location: [facility] OR;  Service: Orthopedics;  Laterality: Right;  . IRRIGATION AND DEBRIDEMENT ABSCESS Right 08/05/2016   Procedure: IRRIGATION AND DEBRIDEMENT LABIAL ABSCESS;  Surgeon: Manus RuddMatthew Tsuei, MD;  Location: [facility] OR;  Service: General;  Laterality: Right;   HPI:  85 year old woman with advanced dementia, COPD, and T2DM admitted for DKA due to missing her insulin for the past 10 days. Per chart DKA is complicated by hypernatremia, acute kidney injury and mild hypokalemia. BSE 07/2016 without overt s/s aspiration, holding meds (hx expectorating meds); reg/thin rec'd. CXR 5/2 probable minimal atelectasis in the lateral left lung base. No other  acute abnormalities. Repeat CXR 5his morning 5/4 due to worsening respiratory status per MD note consider unasyn for aspiration pneumonia.   Assessment / Plan / Recommendation Clinical Impression  Pt without overt s/s aspiration with water and applesauce however subtle signs of possible dysphagia including multiple swallows questionable coordination of swallow/respirations. Mildly prolonged oral manipulation and transit with puree characteristic of pt's with dementia. Given her dementia and worsening CXR, recommend MBS tomorrow and initiate Dys 1, thin (cup/straw) and meds crushed.   SLP Visit Diagnosis: Dysphagia, unspecified (R13.10)    Aspiration Risk  Mild aspiration risk;Moderate aspiration risk    Diet Recommendation Dysphagia 1 (Puree);Thin liquid   Medication Administration: Crushed with puree Supervision: Patient able to self feed;Staff to assist with self feeding;Full supervision/cueing for compensatory strategies Compensations: Minimize environmental distractions;Small sips/bites;Slow rate Postural Changes: Seated upright at 90 degrees    Other  Recommendations Oral Care Recommendations: Oral care BID   Follow up Recommendations 24 hour supervision/assistance;Skilled Nursing facility      Frequency and Duration min 2x/week  2 weeks       Prognosis Prognosis for Safe Diet Advancement: Fair Barriers to Reach Goals: Cognitive deficits      Swallow Study   General HPI: 85 year old woman with advanced dementia, COPD, and T2DM admitted for DKA due to missing her insulin for the past 10 days. Per chart DKA is complicated by hypernatremia, acute kidney injury and mild hypokalemia. BSE 07/2016 without overt s/s aspiration, holding meds (hx expectorating meds); reg/thin rec'd. CXR 5/2 probable minimal atelectasis in the lateral left lung base. No other acute abnormalities.  Repeat CXR 5his morning 5/4 due to worsening respiratory status per MD note consider unasyn for aspiration  pneumonia. Type of Study: Bedside Swallow Evaluation Previous Swallow Assessment: see HPI Diet Prior to this Study: NPO Temperature Spikes Noted: No Respiratory Status: Room air History of Recent Intubation: No Behavior/Cognition: Alert;Cooperative;Pleasant mood Oral Cavity Assessment: Other (comment)(lingual candidia) Oral Care Completed by SLP: No Oral Cavity - Dentition: Edentulous Vision: Functional for self-feeding Self-Feeding Abilities: Able to feed self;Needs assist;Needs set up Patient Positioning: Upright in bed Baseline Vocal Quality: Normal    Oral/Motor/Sensory Function Overall Oral Motor/Sensory Function: Within functional limits   Ice Chips Ice chips: Not tested   Thin Liquid Thin Liquid: Impaired Presentation: Cup;Straw Oral Phase Impairments: Reduced lingual movement/coordination Oral Phase Functional Implications: Prolonged oral transit Pharyngeal  Phase Impairments: Multiple swallows    Nectar Thick Nectar Thick Liquid: Not tested   Honey Thick Honey Thick Liquid: Not tested   Puree Puree: Impaired Presentation: Spoon Oral Phase Impairments: Reduced lingual movement/coordination Oral Phase Functional Implications: Prolonged oral transit Pharyngeal Phase Impairments: Other (comments)(none)   Solid     Solid: Not tested      Royce Macadamia 11/26/2018,3:20 PM  Breck Coons Lonell Face.Ed Nurse, children's [phone] Office [phone]

## 2018-11-26 NOTE — Discharge Summary (Addendum)
Name: Sharon [name] MRN: [id number] DOB: 1929/05/17 85 y.o. PCP: Burna CashSantos-Sanchez, Idalys, MD  Date of Admission: 11/24/2018 12:39 PM Date of Discharge: 11/27/18 Attending Physician: Burns SpainButcher, Elizabeth A, MD  Discharge Diagnosis: 1. DKA 2/2 insulin non-compliance, T2DM 2. Hypernatremia 3. Alzheimer's Dementia  4. HTN 5. Periodontal disease, right maxilla 6. Sacral pressure ulcer   Discharge Medications: Allergies as of 11/28/2018      Reactions   Penicillins Anaphylaxis   Has patient had a PCN reaction causing immediate rash, facial/tongue/throat swelling, SOB or lightheadedness with hypotension:YES Has patient had a PCN reaction causing severe rash involving mucus membranes or skin necrosis: NO Has patient had a PCN reaction that required hospitalization NO Has patient had a PCN reaction occurring within the last 10 years:NO If all of the above answers are "NO", then may proceed with Cephalosporin use.   Codeine Hives, Itching   Hives itching   Other Other (See Comments)   Anesthesia-trouble waking up   Shellfish Allergy Hives   hives      Medication List    TAKE these medications   Accu-Chek Guide test strip Generic drug:  glucose blood CHECK BLOOD SUGAR UP TO 2 TIMES A DAY AS INSTRUCTED Dx code E11.65   BD Veo Insulin Syringe U/F 31G X 15/64" 0.3 ML Misc Generic drug:  Insulin Syringe-Needle U-100 USE TO ADMINISTER INSULIN AS DIRECTED.   diphenhydramine-acetaminophen 25-500 MG Tabs tablet Commonly known as:  TYLENOL PM Take 0.5-1 tablets by mouth at bedtime as needed (sleep and pain).   feeding supplement (GLUCERNA SHAKE) Liqd Take 237 mLs by mouth 2 (two) times daily between meals. What changed:  when to take this   insulin detemir 100 UNIT/ML injection Commonly known as:  Levemir Inject 0.1 mLs (10 Units total) into the skin at bedtime. Can increase by 2U every day if blood sugars remain consistently over 250 What changed:    how much to take  additional  instructions   Insulin Pen Needle 31G X 6 MM Misc Use to inject 35 units of Lantus once a day in the morning.   Xarelto 10 MG Tabs tablet Generic drug:  rivaroxaban TAKE 1 TABLET BY MOUTH EVERY DAY What changed:  how much to take       Disposition and follow-up:   [name] was [facility] [place].  At the hospital follow up visit please address:  1. DKA 2/2 insulin non-compliance, T2DM  - d/c'd on Levemir 10U with instructions to increase by 2U daily if CBGs persistently >    250  2. Hypernatremia  - ask about po intake   3. Alzheimer's Dementia   - continue goals of care discussions  2.  Labs / imaging needed at time of follow-up: none, if decreased po intake/AMS, consider repeat BMP to assess sodium   3.  Pending labs/ test needing follow-up: none  Follow-up Appointments:   Hospital Course by problem list: 85yoF with alzheimer's dementia(mostly bed bound, nonsensical conversation), copd, t2dm presenting with one week of worsening mental status and hyperglycemiain the setting of not taking insulin for 10 days.Found to be in DKA and also hypernatremic.    [name] lives with her husband and daughter. The daughter is the primary care giver but had been trying to keep her distance from her mom because the daughter had an eye infection and did not want to pass it along to her mom. She thought [name]'s husband's had been administering her insulin  but he was not. The daughter takes very good care of her mom at home and does not want her mom to end up in a facility.   On admission, she was somnolent, minimally responsive, would not follow commands. Vital signs were stable and she was protecting her airway. DKA was treated with IV insulin drip and hypernatremia improved with IVFs. Her mental improved by hospital day 2 but she did not seem completely back to baseline. Goals of care discussion with family and they still wanted to  pursue reversible causes. Head CT was negative for acute stroke. CXR without evidence of pneumonia. Bladder scan revealed urinary retention, she is incontinent at baseline. Blood cultures were negative. Urine culture grew mult species. She was evaluated by SLP, PT/OT who recommended SNF/24h supervision. Barium swallow was performed and she tolerated large boluses with only mild aspiration risk. Daughter chose to continue caring for her mom at home.   Blood pressure was elevated during admission but finally improved without intervention.   Discharge Vitals:   BP 120/80 (BP Location: Right Arm)   Pulse 78   Temp 98.9 F (37.2 C) (Oral)   Resp 16   Ht 5\' 2"  (1.575 m)   Wt 48 kg   SpO2 94%   BMI 19.35 kg/m   Pertinent Labs, Studies, and Procedures:  CXR  Grossly unchanged cardiac silhouette and mediastinal contours with atherosclerotic plaque within the thoracic aorta. The lungs are hyperexpanded with mild diffuse slightly nodular thickening of pulmonary interstitium. Grossly unchanged bilateral infrahilar heterogeneous opacities, left greater than right. No new focal airspace opacities. No pleural effusion or pneumothorax. No evidence of edema. No acute osseous abnormalities.  IMPRESSION: Grossly unchanged bilateral infrahilar opacities, atelectasis versus infiltrate.  Head CT: Brain: No evidence of acute stroke, acute hemorrhage, mass lesion, or extra-axial fluid  Advanced atrophy, hydrocephalus ex vacuo. Hypoattenuation of white matter, likely small vessel disease.  Vascular: Calcification of the cavernous internal carotid arteries consistent with cerebrovascular atherosclerotic disease. No signs of intracranial large vessel occlusion.  Skull: Calvarium intact.  Sinuses/Orbits: No sinus disease. Negative orbits, BILATERAL cataract extraction.  Other: Poor dentition. Single RIGHT maxillary canine or premolar has significant periodontal infection, surrounding osseous  lucency; see series 4, image 3.  IMPRESSION: Atrophy and small vessel disease. No acute intracranial findings.  Poor dentition, with significant periodontal disease in the RIGHT Maxilla.  Results for orders placed or performed during the hospital encounter of 11/24/18  SARS Coronavirus 2 (CEPHEID - Performed in [facility] hospital lab), Hosp Order     Status: None   Collection Time: 11/24/18  2:42 PM  Result Value Ref Range Status   SARS Coronavirus 2 NEGATIVE NEGATIVE Final    Comment: (NOTE) If result is NEGATIVE SARS-CoV-2 target nucleic acids are NOT DETECTED. The SARS-CoV-2 RNA is generally detectable in upper and lower  respiratory specimens during the acute phase of infection. The lowest  concentration of SARS-CoV-2 viral copies this assay can detect is 250  copies / mL. A negative result does not preclude SARS-CoV-2 infection  and should not be used as the sole basis for treatment or other  patient management decisions.  A negative result may occur with  improper specimen collection / handling, submission of specimen other  than nasopharyngeal swab, presence of viral mutation(s) within the  areas targeted by this assay, and inadequate number of viral copies  (<250 copies / mL). A negative result must be combined with clinical  observations, patient history, and epidemiological information. If result  is POSITIVE SARS-CoV-2 target nucleic acids are DETECTED. The SARS-CoV-2 RNA is generally detectable in upper and lower  respiratory specimens dur ing the acute phase of infection.  Positive  results are indicative of active infection with SARS-CoV-2.  Clinical  correlation with patient history and other diagnostic information is  necessary to determine patient infection status.  Positive results do  not rule out bacterial infection or co-infection with other viruses. If result is PRESUMPTIVE POSTIVE SARS-CoV-2 nucleic acids MAY BE PRESENT.   A presumptive positive result  was obtained on the submitted specimen  and confirmed on repeat testing.  While 2019 novel coronavirus  (SARS-CoV-2) nucleic acids may be present in the submitted sample  additional confirmatory testing may be necessary for epidemiological  and / or clinical management purposes  to differentiate between  SARS-CoV-2 and other Sarbecovirus currently known to infect humans.  If clinically indicated additional testing with an alternate test  methodology [phone]) is advised. The SARS-CoV-2 RNA is generally  detectable in upper and lower respiratory sp ecimens during the acute  phase of infection. The expected result is Negative. Fact Sheet for Patients:  BoilerBrush.com.cy Fact Sheet for Healthcare Providers: https://pope.com/ This test is not yet approved or cleared by the [place] FDA and has been authorized for detection and/or diagnosis of SARS-CoV-2 by FDA under an Emergency Use Authorization (EUA).  This EUA will remain in effect (meaning this test can be used) for the duration of the COVID-19 declaration under Section 564(b)(1) of the Act, 21 U.S.C. section 360bbb-3(b)(1), unless the authorization is terminated or revoked sooner. Performed at [facility], [address] [place], [place] [address]   Culture, Urine     Status: Abnormal   Collection Time: 11/24/18  4:12 PM  Result Value Ref Range Status   Specimen Description URINE, RANDOM  Final   Special Requests   Final    NONE Performed at [facility], [address], [place], [place] [address]    Culture MULTIPLE SPECIES PRESENT, SUGGEST RECOLLECTION (A)  Final   Report Status 11/26/2018 FINAL  Final  Culture, blood (routine x 2)     Status: None (Preliminary result)   Collection Time: 11/24/18  4:45 PM  Result Value Ref Range Status   Specimen Description BLOOD RIGHT ANTECUBITAL  Final   Special Requests   Final    AEROBIC BOTTLE ONLY Blood Culture  results may not be optimal due to an inadequate volume of blood received in culture bottles   Culture   Final    NO GROWTH 4 DAYS Performed at [facility], [address] [place], [place] [address]    Report Status PENDING  Incomplete  Culture, blood (routine x 2)     Status: None (Preliminary result)   Collection Time: 11/24/18  5:00 PM  Result Value Ref Range Status   Specimen Description BLOOD RIGHT ARM  Final   Special Requests   Final    BOTTLES DRAWN AEROBIC AND ANAEROBIC Blood Culture adequate volume   Culture   Final    NO GROWTH 4 DAYS Performed at [facility], [address] [place], [place] [address]    Report Status PENDING  Incomplete   BMP Latest Ref Rng & Units 11/27/2018 11/26/2018 11/26/2018  Glucose 70 - 99 mg/dL 401(U) 272(Z) 366(Y)  BUN 8 - 23 mg/dL 19 17 40(H)  Creatinine 0.44 - 1.00 mg/dL 4.74 2.59 5.63  BUN/Creat Ratio 12 - 28 - - -  Sodium 135 - 145 mmol/L 141 142 148(H)  Potassium 3.5 - 5.1 mmol/L 3.8 3.0(L) 3.9  Chloride 98 - 111 mmol/L 109 108 118(H)  CO2 22 - 32 mmol/L Calcium 8.9 - 10.3 mg/dL 6.9(G) 2.9(B) 2.8(U)   CBC Latest Ref Rng & Units 11/25/2018 11/24/2018 11/24/2018  WBC 4.0 - 10.5 K/uL 9.7 - 12.1(H)  Hemoglobin 12.0 - 15.0 g/dL 13.2 44.0 10.2  Hematocrit 36.0 - 46.0 % 45.2 38.0 49.0(H)  Platelets 150 - 400 K/uL 226 - 301     Discharge Instructions: Discharge Instructions    Diet - low sodium heart healthy   Complete by:  As directed    Discharge instructions   Complete by:  As directed    [name] was admitted to the hospital for DKA (diabetic ketoacidosis) as well as hypernatremia (high sodium). She was treated with an insulin infusion and then transitioned back to her home levemir. The hypernatremia was secondary to dehydration from her sugars being so high and her not eating or drinking much. We gave her fluids through her IV and her sodium came down, back to normal range. It was a pleasure caring for you. If you  experience any fevers, decreased mental status, nausea, vomiting please come back to the hospital.   Dr. Evelene Croon will call you in one week to check in.   Dr. Evelene Croon will call you in one week to follow up.   Increase activity slowly   Complete by:  As directed       Signed: Ali Lowe, MD 11/28/2018, 10:13 AM   Pager: [phone]

## 2018-11-26 NOTE — Progress Notes (Signed)
  Date: 11/26/2018  Patient name: Sharon Butler  Medical record number: [id number]  Date of birth: 01-26-1929   I have seen and evaluated this patient and I have discussed the plan of care with the house staff. Please see their note for complete details. I concur with their findings with the following additions/corrections: Sharon Butler was seen this morning on team rounds.  She has made improvement per the team who has evaluated her since May 1. Per Dr. Evelene Croon, she is not at her baseline and this likely has been a subacute change as Dr. Evelene Croon last saw her in March.  The team investigated several possible etiologies for her encephalopathy on admission.  They are expanding investigations today to include a CT of the head (no acute changes), urinary retention (PVR 500 cc - order for in and out cath), and aspiration pneumonia. I personally viewed the chest x-ray image from today which is a 1 view, portable, PA which shows aortic calcification and bilateral streaky inferior opacities.  I doubt pneumonia is contributing to her encephalopathy as she is afebrile, without leukocytosis, without hypoxia, and the encephalopathy occurred well before her cough today.  Therefore, antibiotics are not indicated.  We will need ongoing discussions with her daughter.  The patient has already been set up for hospice and palliative care in the outpatient setting in early April.  Burns Spain, MD 11/26/2018, 12:27 PM

## 2018-11-26 NOTE — Progress Notes (Signed)
Attempt was made to put pt on bedside commode per daughter's suggestion.  Pt could not stand at all.  Bed pad and linens were saturated with urine.  Pt was cleaned and bladder scan showed 352.  MD made aware.  Order for scheduled bladder scans.  Will continue to monitor.

## 2018-11-26 NOTE — Progress Notes (Addendum)
   Subjective: Continues to improve. Alert and interactive. Oriented to self but nothing else. Responds to questions but answers do not always make sense. Not very cooperative with exam. Per daughter's description, she is still not back to her baseline.   Objective:  Vital signs in last 24 hours: Vitals:   11/25/18 1807 11/25/18 1951 11/26/18 0000 11/26/18 0929  BP: (!) 190/73 (!) 182/74 (!) 153/80 (!) 180/69  Pulse:  64 80 (!) 35  Resp:  (!) 22 (!) 23 (!) 22  Temp:  98 F (36.7 C) 97.9 F (36.6 C) 97.8 F (36.6 C)  TempSrc:  Oral Oral Oral  SpO2:  98% 95% 94%  Weight:      Height:       Gen: laying in bed, NAD Abd: soft, NT, ND, +BS Pulm: CTAB Neuro: opens eyes and tracks appropriately, moving all extremities equally, difficult to assess strength given lack of cooperation   Assessment/Plan:  Principal Problem:   DKA (diabetic ketoacidoses) (HCC) Active Problems:   Dementia (HCC)   Poorly controlled type 2 diabetes mellitus (HCC)   Pressure injury of skin   Hypernatremia   Hypokalemia   Sharon Butler with alzheimer's dementia (mostly bed bound, nonsensical conversation), copd, t2dm presenting with one week of worsening mental status and hyperglycemia in the setting of not taking insulin for 10 days. Found to be in DKA and also hypernatremic   AMS: improving with correction of Na but mental status is still not back to baseline. Alert and oriented to self. Responds to questions. Still less talkative than her baseline behavior two weeks ago. This could be her new baseline but we will investigate for other reversible causes with head CT and CXR.  - BCx NG@2d  - UCx with mult species, suggest recollection  - head CT: no acute finding - CXR: bilat infrahilar opacities, atelectasis vs infiltrate  - if I&O cath done, will send to lab for UA and culture   Urinary retention: I expect she is incontinent at baseline. Bladder scan this morning with 500cc, urinated in the bed twice, PVR  350cc.  - schedule bladder scans q8h - I&O if >400cc  HTN: persistently hypertensive during admission. No previous h/o chronic HTN. Not on any antihypertensives at home. Concern for stroke but head CT negative.  - manual BP, if elevated, will start antihypertensive therapy   DKA 2/2 insulin non-compliance, T2DM: DKA resolved - CBGs well controlled with SSI tic wc qhs  Hypernatremia: Improving - continue 1/2NS at 125cc/hr  - q12h bmp   Alzheimer's dementia: end stages, follows with palliative  - malnourished, daily thiamine and folate  - SLP eval, PT/OT  Poor dentition: CT head revealed significant periodontal disease in the RIGHTmaxilla. - will do oral exam  Pressure ulcer: sacral ulcer - frequent turns - protective dressing   DVT ppx: lovenox Code: DNR, confirmed with daughter Diet: npo, SLP and/or bedside swallow eval   Dispo: Anticipated discharge in approximately 2  day(s).   Ali Lowe, MD 11/26/2018, 11:38 AM Pager: [phone]

## 2018-11-26 NOTE — Progress Notes (Signed)
SLP Cancellation Note  Patient Details Name: Sharon Butler MRN: [id number] DOB: 1929/06/20   Cancelled treatment:        Pt having bladder scanned with RN and transport arrived to take pt to CT. Will return as early as able today.   Royce Macadamia 11/26/2018, 9:31 AM   Breck Coons Lonell Face.Ed Nurse, children's [phone] Office [phone]

## 2018-11-26 NOTE — Evaluation (Signed)
Physical Therapy Evaluation Patient Details Name: Sharon Butler MRN: [id number] DOB: May 31, 1929 Today's Date: 11/26/2018   History of Present Illness  89yoF with alzheimer's dementia (mostly bed bound, nonsensical conversation), copd, t2dm presenting with one week of worsening mental status and hyperglycemia. Pt hasn't received insulin in 10 days.  Clinical Impression  Pt with advanced dementia and very little participation in PT. Pt dependent for all mobility and ADLs. Unsure of PLOF however per chart pt was w/c bound, dtr managed medications, and she has a palliative care RN that bathed her. Unsure if spouse is capable of caring for patient properly as she went without insulin for 10 days. Unless 24/7 assist is available to care for patient, patient will need long term care in a SNF. Acute PT to cont to follow.    Follow Up Recommendations SNF;Supervision/Assistance - 24 hour(unsure that patient was taken care of properly)    Equipment Recommendations       Recommendations for Other Services       Precautions / Restrictions Precautions Precautions: Fall Precaution Comments: pt with advanced dementia Restrictions Weight Bearing Restrictions: No      Mobility  Bed Mobility Overal bed mobility: Needs Assistance Bed Mobility: Supine to Sit;Sit to Supine     Supine to sit: Max assist Sit to supine: Max assist   General bed mobility comments: pt with no assist with transfer or initiation, pt required maxA to maintain EOB balance  Transfers                 General transfer comment: deferred due to patients inability to participate  Ambulation/Gait             General Gait Details: unable  Stairs            Wheelchair Mobility    Modified Rankin (Stroke Patients Only)       Balance Overall balance assessment: Needs assistance Sitting-balance support: Feet supported;Single extremity supported Sitting balance-Leahy Scale: Zero Sitting balance -  Comments: dependent on physical assist Postural control: Right lateral lean;Posterior lean                                   Pertinent Vitals/Pain Pain Assessment: No/denies pain    Home Living Family/patient expects to be discharged to:: Private residence Living Arrangements: Spouse/significant other Available Help at Discharge: Family;Available 24 hours/day Type of Home: Mobile home Home Access: Stairs to enter Entrance Stairs-Rails: None Entrance Stairs-Number of Steps: 3 Home Layout: One level Home Equipment: Walker - 2 wheels;Cane - single point;Bedside commode;Shower seat;Wheelchair - manual Additional Comments: per chart pt lives at home with spouse, daughter manages medicine but hasn't been there in 10 days due to having conjuctivitis, also has a palliative care RN. home set up taken from previous admission in 2018, unsure if currently accurate    Prior Function Level of Independence: Needs assistance   Gait / Transfers Assistance Needed: per chart pt has been w/c, per admission in 2018 pt was amb across house  ADL's / Homemaking Assistance Needed: assist from family        Hand Dominance        Extremity/Trunk Assessment   Upper Extremity Assessment Upper Extremity Assessment: Generalized weakness    Lower Extremity Assessment Lower Extremity Assessment: Generalized weakness    Cervical / Trunk Assessment Cervical / Trunk Assessment: Kyphotic  Communication   Communication: Other (comment)(pt with garbled, soft spoken speech)  Cognition Arousal/Alertness: Lethargic Behavior During Therapy: Flat affect Overall Cognitive Status: History of cognitive impairments - at baseline                                 General Comments: pt with advanced dementia. Pt only answered her name correctly when asked, all other conversation was unable to be identified or not related to question asked. used spanish interpreter Darien Ramus  [phone]       General Comments General comments (skin integrity, edema, etc.): vss    Exercises     Assessment/Plan    PT Assessment Patient needs continued PT services  PT Problem List Decreased strength;Decreased activity tolerance;Decreased balance;Decreased mobility;Decreased coordination;Decreased knowledge of use of DME;Decreased safety awareness       PT Treatment Interventions      PT Goals (Current goals can be found in the Care Plan section)  Acute Rehab PT Goals PT Goal Formulation: Patient unable to participate in goal setting Time For Goal Achievement: 12/10/18 Potential to Achieve Goals: Fair    Frequency     Barriers to discharge Decreased caregiver support pt's spouse home but not able to give meds, went without insulin for 10 days, unsure how much spouse can physically assist patient as palliative care RN provided the bath    Co-evaluation               AM-PAC PT "6 Clicks" Mobility  Outcome Measure Help needed turning from your back to your side while in a flat bed without using bedrails?: Total Help needed moving from lying on your back to sitting on the side of a flat bed without using bedrails?: Total Help needed moving to and from a bed to a chair (including a wheelchair)?: Total Help needed standing up from a chair using your arms (e.g., wheelchair or bedside chair)?: Total Help needed to walk in hospital room?: Total Help needed climbing 3-5 steps with a railing? : Total 6 Click Score: 6    End of Session   Activity Tolerance: Patient limited by fatigue Patient left: in bed;with call bell/phone within reach;with bed alarm set Nurse Communication: Mobility status PT Visit Diagnosis: Unsteadiness on feet (R26.81);Muscle weakness (generalized) (M62.81);Difficulty in walking, not elsewhere classified (R26.2)    Time: 0881-1031 PT Time Calculation (min) (ACUTE ONLY): 18 min   Charges:   PT Evaluation $PT Eval Moderate Complexity: 1 Mod           Lewis Shock, PT, DPT Acute Rehabilitation Services Pager #: [phone] Office #: [phone]   Iona Hansen 11/26/2018, 2:31 PM

## 2018-11-27 ENCOUNTER — Inpatient Hospital Stay (HOSPITAL_COMMUNITY): Payer: Medicare HMO

## 2018-11-27 DIAGNOSIS — K056 Periodontal disease, unspecified: Secondary | ICD-10-CM

## 2018-11-27 DIAGNOSIS — Z884 Allergy status to anesthetic agent status: Secondary | ICD-10-CM

## 2018-11-27 LAB — BASIC METABOLIC PANEL
Anion gap: 9 (ref 5–15)
BUN: 19 mg/dL (ref 8–23)
CO2: 23 mmol/L (ref 22–32)
Calcium: 8.6 mg/dL — ABNORMAL LOW (ref 8.9–10.3)
Chloride: 109 mmol/L (ref 98–111)
Creatinine, Ser: 0.81 mg/dL (ref 0.44–1.00)
GFR calc Af Amer: >60 mL/min (ref >60)
GFR calc non Af Amer: >60 mL/min (ref >60)
Glucose, Bld: 345 mg/dL — ABNORMAL HIGH (ref 70–99)
Potassium: 3.8 mmol/L (ref 3.5–5.1)
Sodium: 141 mmol/L (ref 135–145)

## 2018-11-27 LAB — GLUCOSE, CAPILLARY
Glucose-Capillary: 319 mg/dL — ABNORMAL HIGH (ref 70–99)
Glucose-Capillary: 242 mg/dL — ABNORMAL HIGH (ref 70–99)
Glucose-Capillary: 212 mg/dL — ABNORMAL HIGH (ref 70–99)
Glucose-Capillary: 120 mg/dL — ABNORMAL HIGH (ref 70–99)

## 2018-11-27 MED ORDER — INSULIN DETEMIR 100 UNIT/ML ~~LOC~~ SOLN
8.0000 [IU] | Freq: Every day | SUBCUTANEOUS | Status: DC
  Administered 2018-11-27 – 2018-11-28 (×2): 8 [IU] via SUBCUTANEOUS
  Filled 2018-11-27 (×2): qty 0.08

## 2018-11-27 MED ORDER — GLUCERNA SHAKE PO LIQD
237.0000 mL | Freq: Three times a day (TID) | ORAL | Status: DC
  Administered 2018-11-27 – 2018-11-28 (×3): 237 mL via ORAL

## 2018-11-27 NOTE — Progress Notes (Signed)
OT Cancellation Note  Patient Details Name: Sharon Butler MRN: [id number] DOB: 1928-10-29   Cancelled Treatment:    Reason Eval/Treat Not Completed: Patient at procedure or test/ unavailable. Per unit secretary pt is currently in ultrasound.  Ignacia Palma, OTR/L Acute Rehab Services Pager [phone] Office [phone]     Evette Georges 11/27/2018, 9:59 AM

## 2018-11-27 NOTE — Clinical Social Work Note (Signed)
CSW spoke with pt's daughter via telephone. CSW was inquiring reasons behind pt missing her insulin for 10 days. Pt's daughter states "I take full responsibility." Pt's daughter states pt's husband will not give pt's insulin and that pt's daughter is the one who gives pt all her medications. Pt's daughter states she was sick and had issues with her eyes and was told she was contagious. With all that is going on with the virus pt's daughter did not want to expose her elderly mother or father to anything she could have had so she was waiting it out. Pt's daughter states pt's insurance covers insulin tremendously so they have plenty and are always able to afford it. Pt's daughter states she doesn't have a car however has a ex husband who is willing to let her use his car sometimes to run errands. Pt's daughter states they are agreeable to [facility] PT however, NOT agreeable to SNF. Pt's daughter is very fearful of her mother getting exposed to the virus. Pt states she will not let "this" happen again even if she has to dress in a trash bag from head to toe- referring to her mother missing insulin. Per pt's daughter pt has used [employer] in the past and she would like to proceed with this company. Pt has a walker, bed side commode, hospital bed, 4 wheel walker, and cane. RNCM updated. It is possible that pt will need transport home at d/c and CSW will assist in setting this up. Per IM Resident's note anticipated d/c dated for tomorrow 5/6. CSW continuing to follow.  [place], [place] [phone]

## 2018-11-27 NOTE — Progress Notes (Signed)
   Subjective: doing about the same. Had swallow study this morning. Spoke with daughter, she is ok for discharge but would like to wait until the morning.   Objective:  Vital signs in last 24 hours: Vitals:   11/26/18 1900 11/26/18 2348 11/27/18 0536 11/27/18 0600  BP: 128/60 (!) 157/72 (!) 174/78   Pulse: 76  74   Resp: 18 17 16    Temp: 97.8 F (36.6 C) 98.3 F (36.8 C)  98.7 F (37.1 C)  TempSrc: Oral Oral  Oral  SpO2: 94% 97% 96%   Weight:    47 kg  Height:       Gen: laying in bed, NAD HENT: missing several teeth, erythema on posterior molars of upper jaw but no open wounds, discharge, significant swelling or foul odor  Neuro: oriented to self only   Assessment/Plan:  Principal Problem:   DKA (diabetic ketoacidoses) (HCC) Active Problems:   Dementia (HCC)   Poorly controlled type 2 diabetes mellitus (HCC)   Pressure injury of skin   Hypernatremia   Hypokalemia   89yoF with alzheimer's dementia (mostly bed bound, nonsensical conversation), copd, t2dm presenting with one week of worsening mental status and hyperglycemia in the setting of not taking insulin for 10 days. Found to be in DKA and also hypernatremic   AMS: No other causes identified on workup done yesterday. Daughter feels that she is at her baseline.  - BCx NG@3d  - UCx with mult species, suggest recollection  - head CT: no acute finding  - CXR: bilat infrahilar opacities, atelectasis vs infiltrate  - if I&O cath done, will send to lab for UA and culture   Urinary retention: I expect she is incontinent at baseline. Bladder scans with 300-500cc of urine.   - schedule bladder scans q8h - I&O if >400cc  HTN: improving without addition of antihypertensives   DKA 2/2 insulin non-compliance, T2DM: DKA resolved - CBGs in the 300s - start levemir 8U qd  - continue SSI tic wc qhs  Hypernatremia: resolved with IVFS - d/c 1/2NS - daily bmp   Alzheimer's dementia: end stages, follows with palliative   - malnourished, daily thiamine and folate  - SLP eval: MBS showed mild aspiration risk, she was able to tolerate large amounts of liquid and food.  - PT rec SNF vs 24hr care, daughter wants to care for her at home  Poor dentition: CT head revealed significant periodontal disease in the RIGHTmaxilla. - no evidence of infection on exam  Pressure ulcer: sacral ulcer - frequent turns - protective dressing   DVT ppx: lovenox Code: DNR, confirmed with daughter Diet: DYS1  Dispo: Anticipated discharge tomorrow.   Ali Lowe, MD 11/27/2018, 11:23 AM Pager: [phone]

## 2018-11-27 NOTE — Progress Notes (Signed)
  Date: 11/27/2018  Patient name: Sharon Butler  Medical record number: [id number]  Date of birth: 10/29/1928   I have seen and evaluated this patient and I have discussed the plan of care with the house staff. Please see their note for complete details. I concur with their findings with the following additions/corrections: Ms. Markland was seen a short while ago with the team.  She is pleasantly confused but better than on admission.  She is medically stable to be discharged and will be discharged to her daughter's care tomorrow.  Burns Spain, MD 11/27/2018, 1:45 PM

## 2018-11-28 LAB — GLUCOSE, CAPILLARY
Glucose-Capillary: 113 mg/dL — ABNORMAL HIGH (ref 70–99)
Glucose-Capillary: 252 mg/dL — ABNORMAL HIGH (ref 70–99)

## 2018-11-28 MED ORDER — INSULIN DETEMIR 100 UNIT/ML ~~LOC~~ SOLN: 10.0000 [IU] | Freq: Every day | SUBCUTANEOUS | 1 refills | Status: DC

## 2018-11-28 NOTE — Progress Notes (Signed)
[facility] to escort pt home w/ d/c instructions. IV removed, clean and intact. All belongings sent w/ patient.  Versie Starks, RN

## 2018-11-28 NOTE — Progress Notes (Addendum)
Bladder scan @0030  was 391 ml.Went to I/o cath pt, pt had voided 100cc and some in white pad. Post void bladder scan was 135 cc. Will continue to monitor.

## 2018-11-28 NOTE — Progress Notes (Signed)
  Date: 11/28/2018  Patient name: Sharon Butler  Medical record number: [id number]  Date of birth: [birth year]   Sharon Butler was clinically stable to be discharged on May 5.  Dr. Petra Kuba completed the discharge work on May 5.  The daughter was unable to come to pick up her mother on May 5 and ask that the discharge be delayed until May 6.  The team did not round on Sharon Butler today on May 6 as she has been medically stable and ready for discharge yesterday.  The discharge was submitted on May 5.  Burns Spain, MD 11/28/2018, 10:20 AM

## 2018-11-28 NOTE — TOC Transition Note (Signed)
Transition of Care [facility]) - CM/SW Discharge Note Donn Pierini RN, BSN Transitions of Care Unit 4E- RN Case Manager [phone]  Patient Details  Name: Sharon Butler MRN: [id number] Date of Birth: 07-01-1929  Transition of Care [facility]) CM/SW Contact:  Darrold Span, RN Phone Number: 11/28/2018, 11:50 AM   Clinical Narrative:    Pt for transition home today, per CSW note from 11/27/18 daughter does not want STSNF and prefers pt to return home. Per MD no HH needs as pt is close to baseline.  Daughter reports that they have all needed DME- pt will need [facility] transport home as daughter does not have transportation to come get mom. EMS transport has been arranged. Call made to [facility] for transport, paperwork along with GOLD DNR as been placed on shadowchart- bedside RN aware.    Final next level of care: Home/Self Care Barriers to Discharge: No Barriers Identified   Patient Goals and CMS Choice Patient states their goals for this hospitalization and ongoing recovery are:: (pt with dementia/unable)   Choice offered to / list presented to : NA  Discharge Placement   Home with family.                      Discharge Plan and Services In-house Referral: Clinical Social Work Discharge Planning Services: CM Consult Post Acute Care Choice: NA          DME Arranged: N/A DME Agency: NA       HH Arranged: NA HH Agency: NA        Social Determinants of Health (SDOH) Interventions     Readmission Risk Interventions Readmission Risk Prevention Plan 11/28/2018  Post Dischage Appt Complete  Medication Screening Complete  Transportation Screening Complete

## 2018-11-28 NOTE — Discharge Instructions (Signed)
Diabetic Ketoacidosis °Diabetic ketoacidosis is a serious complication of diabetes. This condition develops when there is not enough insulin in the body. Insulin is an hormone that regulates blood sugar levels in the body. Normally, insulin allows glucose to enter the cells in the body. The cells break down glucose for energy. Without enough insulin, the body cannot break down glucose, so it breaks down fats instead. This leads to high blood glucose levels in the body and the production of acids that are called ketones. Ketones are poisonous at high levels. °If diabetic ketoacidosis is not treated, it can cause severe dehydration and can lead to a coma or death. °What are the causes? °This condition develops when a lack of insulin causes the body to break down fats instead of glucose. This may be triggered by: °· Stress on the body. This stress is brought on by an illness. °· Infection. °· Medicines that raise blood glucose levels. °· Not taking diabetes medicine. °· New onset of type 1 diabetes mellitus. °What are the signs or symptoms? °Symptoms of this condition include: °· Fatigue. °· Weight loss. °· Excessive thirst. °· Light-headedness. °· Fruity or sweet-smelling breath. °· Excessive urination. °· Vision changes. °· Confusion or irritability. °· Nausea. °· Vomiting. °· Rapid breathing. °· Abdominal pain. °· Feeling flushed. °How is this diagnosed? °This condition is diagnosed based on your medical history, a physical exam, and blood tests. You may also have a urine test to check for ketones. °How is this treated? °This condition may be treated with: °· Fluid replacement. This may be done to correct dehydration. °· Insulin injections. These may be given through the skin or through an IV tube. °· Electrolyte replacement. Electrolytes are minerals in your blood. Electrolytes such as potassium and sodium may be given in pill form or through an IV tube. °· Antibiotic medicines. These may be prescribed if your  condition was caused by an infection. °Diabetic ketoacidosis is a serious medical condition. You may need emergency treatment in the hospital to monitor your condition. °Follow these instructions at home: °Eating and drinking °· Drink enough fluids to keep your urine clear or pale yellow. °· If you are not able to eat, drink clear fluids in small amounts as you are able. Clear fluids include water, ice chips, fruit juice with water added (diluted), and low-calorie sports drinks. You may also have sugar-free jello or popsicles. °· If you are able to eat, follow your usual diet and drink sugar-free liquids, such as water. °Medicines °· Take over-the-counter and prescription medicines only as told by your health care provider. °· Continue to take insulin and other diabetes medicines as told by your health care provider. °· If you were prescribed an antibiotic, take it as told by your health care provider. Do not stop taking the antibiotic even if you start to feel better. °General instructions ° °· Check your urine for ketones when you are ill and as told by your health care provider. °? If your blood glucose is 240 mg/dL (13.3 mmol/L) or higher, check your urine ketones every 4-6 hours. °· Check your blood glucose every day, as often as told by your health care provider. °? If your blood glucose is high, drink plenty of fluids. This helps to flush out ketones. °? If your blood glucose is above your target for 2 tests in a row, contact your health care provider. °· Carry a medical alert card or wear medical alert jewelry that says that you have diabetes. °· Rest   and exercise only as told by your health care provider. Do not exercise when your blood glucose is high and you have ketones in your urine.  If you get sick, call your health care provider and begin treatment quickly. Your body often needs extra insulin to fight an illness. Check your blood glucose every 4-6 hours when you are sick.  Keep all follow-up  visits as told by your health care provider. This is important. Contact a health care provider if:  Your blood glucose level is higher than 240 mg/dL (56.213.3 mmol/L) for 2 days in a row.  You have moderate or large ketones in your urine.  You have a fever.  You cannot eat or drink without vomiting.  You have been vomiting for more than 2 hours.  You continue to have symptoms of diabetic ketoacidosis.  You develop new symptoms. Get help right away if:  Your blood glucose monitor reads high even when you are taking insulin.  You faint.  You have chest pain.  You have trouble breathing.  You have sudden trouble speaking or swallowing.  You have vomiting or diarrhea that gets worse after 3 hours.  You are unable to stay awake.  You have trouble thinking.  You are severely dehydrated. Symptoms of severe dehydration include: ? Extreme thirst. ? Dry mouth. ? Rapid breathing. These symptoms may represent a serious problem that is an emergency. Do not wait to see if the symptoms will go away. Get medical help right away. Call your local emergency services (911 in the [place] Do not drive yourself to the hospital. Summary  Diabetic ketoacidosis is a serious complication of diabetes. This condition develops when there is not enough insulin in the body.  This condition is diagnosed based on your medical history, a physical exam, and blood tests. You may also have a urine test to check for ketones.  Diabetic ketoacidosis is a serious medical condition. You may need emergency treatment in the hospital to monitor your condition.  Contact your health care provider if your blood glucose is higher than 240 mg/dl for 2 days in a row or if you have moderate or large ketones in your urine. This information is not intended to replace advice given to you by your health care provider. Make sure you discuss any questions you have with your health care provider. Document Released: 07/08/2000  Document Revised: 08/15/2016 Document Reviewed: 08/15/2016 Elsevier Interactive Patient Education  2019 Elsevier Inc.   Hypernatremia  Hypernatremia is when the blood has too much salt (sodium) in it and not enough water. Water and salt must be balanced in the blood. This condition is serious and often an emergency. Follow these instructions at home:  Drink enough fluid to keep your pee (urine) clear or pale yellow.  Take over-the-counter and prescription medicines only as told by your doctor.  Avoid salty processed foods, including canned, jarred, frozen, or boxed foods. Some examples include pickles, frozen dinners, canned soups, potato and corn chips, and olives.  Always drink fluids after exercise.  Always drink fluids after throwing up (vomiting) or having watery poop (diarrhea).  Keep all follow-up visits as told by your doctor. This is important. Contact a doctor if:  You have watery poop.  You throw up.  You have a fever or chills.  You cannot follow advice from your doctor about drinking fluids. Get help right away if:  You feel light-headed or weak.  You have a fast heartbeat.  You get confused.  You  get restless.  You get short-tempered.  You have a fit of movements that you cannot control (seizure).  You faint.  You have signs of losing too much body fluid (dehydration), such as: ? Dark pee, or very little or no pee. ? Cracked lips. ? No tears. ? Dry mouth. ? Sunken eyes. ? Sleepiness. This information is not intended to replace advice given to you by your health care provider. Make sure you discuss any questions you have with your health care provider. Document Released: 06/30/2011 Document Revised: 12/17/2015 Document Reviewed: 11/26/2014 Elsevier Interactive Patient Education  2019 [employer].

## 2018-11-29 LAB — CULTURE, BLOOD (ROUTINE X 2)
Culture: NO GROWTH
Culture: NO GROWTH
Special Requests: ADEQUATE

## 2018-11-30 ENCOUNTER — Telehealth: Payer: Self-pay | Admitting: *Deleted

## 2018-11-30 ENCOUNTER — Other Ambulatory Visit: Payer: Self-pay | Admitting: Internal Medicine

## 2018-11-30 MED ORDER — DOXYCYCLINE HYCLATE 50 MG PO CAPS: 100.0000 mg | ORAL_CAPSULE | Freq: Two times a day (BID) | ORAL | 0 refills | Status: DC

## 2018-11-30 NOTE — Telephone Encounter (Signed)
Sharon Butler, care connections, h&p care [facility] calls and states on visit today it is found that pt has an area on R outer, upper thigh that was "boil" type in nature, it has opened and is having drainage and some bleeding. She would like to discuss this with md and advice on care. texted dr Lovenia Kim and ask her to call julie at [phone]. If she doesn't hear in 10 mins she will call triage back

## 2018-12-02 NOTE — Telephone Encounter (Signed)
Returned called to [facility]. Stated patient has an open wound on R lateral thigh that she has been scratching frequently and is now red with mild purulent discharge. She is also bleeding frequently from this area. No signs or symptoms of systemic infection at this time. Will message front desk to schedule patient for a in-person appt at [facility] for evaluation of this wound. In the meantime, I sent a prescription for doxycycline x 5 days (allergic to penicillin) and asked her to hold Xarelto for 48 hours. Discussed with Dr. Criselda Peaches.

## 2018-12-03 NOTE — Telephone Encounter (Signed)
I agree. Would continue holding xarelto. Will route to PCP as well

## 2018-12-03 NOTE — Telephone Encounter (Addendum)
Returned call to daughter. States wound continues to bleed about a teaspoonful every time she changes patient from urinary incontinence. Instructed daughter to continue to hold the Xarelto until she speaks with Provider at tomorrow's in- person appt. Daughter is agreeable. Denies fever, but wound very painful to touch and continues to drain "pus." Also, states patient is very weak and it takes 3 of them to transfer her to w/c. States patient usually "bounces back quicker than this." hospice /Palliative care had visit 3 days ago but will only return for emergencies. Kinnie Feil, RN, BSN   ----- Message from Burna Cash, MD sent at 12/03/2018 11:28 AM EDT ----- Regarding: RE: [facility] appt, in-person Ms. Hartig was bleeding from her hip wound and was instructed to hold her Xarelto for 2-3 days. If she has not had any more issues with bleeding she can resume her Xarelto.  ----- Message ----- From: Lianne Bushy Sent: 12/03/2018  11:22 AM EDT To: Burna Cash, MD, # Subject: RE: [facility] appt, in-person                        Called patient at your request.  Appt scheduled for 12/04/2018 at 3:15pm.  But patient wanted me to relay this message to you also.  Per daughter pallative care nurse asked them to withhold xarelto and this was on Friday 11/30/2018.  Should they start back or keep withholding this medicines.    Thanks,  charsetta ----- Message ----- From: Burna Cash, MD Sent: 12/02/2018   8:02 AM EDT To: Imp Front Desk Pool Subject: [facility] appt, in-person                            Ms. Almeyda needs an in-person appt at [facility] for evaluation of a wound in her R thigh. Recommend scheduling 5/11 if possible or 5/12.

## 2018-12-04 ENCOUNTER — Other Ambulatory Visit: Payer: Self-pay

## 2018-12-04 ENCOUNTER — Ambulatory Visit (INDEPENDENT_AMBULATORY_CARE_PROVIDER_SITE_OTHER): Payer: Medicare HMO | Admitting: Internal Medicine

## 2018-12-04 VITALS — BP 131/60 | HR 71 | Temp 99.6°F

## 2018-12-04 DIAGNOSIS — E440 Moderate protein-calorie malnutrition: Secondary | ICD-10-CM

## 2018-12-04 DIAGNOSIS — E118 Type 2 diabetes mellitus with unspecified complications: Secondary | ICD-10-CM

## 2018-12-04 DIAGNOSIS — Z794 Long term (current) use of insulin: Secondary | ICD-10-CM | POA: Diagnosis not present

## 2018-12-04 DIAGNOSIS — L02215 Cutaneous abscess of perineum: Secondary | ICD-10-CM

## 2018-12-04 DIAGNOSIS — Z7901 Long term (current) use of anticoagulants: Secondary | ICD-10-CM | POA: Diagnosis not present

## 2018-12-04 DIAGNOSIS — R32 Unspecified urinary incontinence: Secondary | ICD-10-CM

## 2018-12-04 DIAGNOSIS — E1165 Type 2 diabetes mellitus with hyperglycemia: Secondary | ICD-10-CM

## 2018-12-04 DIAGNOSIS — Z86718 Personal history of other venous thrombosis and embolism: Secondary | ICD-10-CM

## 2018-12-04 DIAGNOSIS — F0390 Unspecified dementia without behavioral disturbance: Secondary | ICD-10-CM

## 2018-12-04 DIAGNOSIS — M6250 Muscle wasting and atrophy, not elsewhere classified, unspecified site: Secondary | ICD-10-CM

## 2018-12-04 DIAGNOSIS — R69 Illness, unspecified: Secondary | ICD-10-CM | POA: Diagnosis not present

## 2018-12-04 LAB — GLUCOSE, CAPILLARY: Glucose-Capillary: 302 mg/dL — ABNORMAL HIGH (ref 70–99)

## 2018-12-04 NOTE — Progress Notes (Signed)
   CC: Right perineal area lesion  HPI:  Sharon Butler is a 85 y.o.   Past Medical History:  Diagnosis Date  . Alzheimer's dementia (HCC)    "dx'd 05/2015; final stages" (06/15/2016)  . Complication of anesthesia    "problems waking up from gallbladder OR in 1978"  . COPD (chronic obstructive pulmonary disease) (HCC)   . High cholesterol   . History of blood transfusion 1996   "related to appendix OR"  . Infected surgical wound 07/2016   rt hip  . Type II diabetes mellitus (HCC) 1998   Review of Systems:  Reports right groin area pain, tenderness, bleeding, and yellow drainage, as well as some urinary incontinence and decreased appetite. Denies fevers, chills, nausea, vomiting, diarrhea, dysuria.  Physical Exam:  Vitals:   12/04/18 1522  BP: 131/60  Pulse: 71  Temp: 99.6 F (37.6 C)  TempSrc: Oral  SpO2: 96%   Physical Exam  Constitutional:  Elderly female, intermittent distress with procedure  Cardiovascular: Normal rate and regular rhythm.  Pulmonary/Chest: Effort normal and breath sounds normal. No respiratory distress.  Musculoskeletal:     Comments: In wheelchair, weak, diffuse atrophy  Neurological: She is alert.  Skin: Skin is warm.  Open wound in right lower inguinal area, with gray colored mass extruding out, some yellow drainage with pressure, tender to palpation, no erythema or edema around the area        Assessment & Plan:   See Encounters Tab for problem based charting.  Patient seen with Dr. Criselda Peaches

## 2018-12-04 NOTE — Patient Instructions (Addendum)
[name],  It was a pleasure to see you today. Thank you for coming in.   Today we discussed your right groin area wound. In regards to this we cleaned it and packed it with some iodine packing, we put the pink bandage over this and place the Dermabond cover over this.  Please change this out in 3 days, you can take some Tylenol before you do this.  Remove the packing and replace with new packing, place the pink bandage over this and then the Dermabond bandage. Please hold your Xarelto until we see you next week, there is a risk of having another blood clot but since your DVT was in 2018 I think the risk of bleeding is higher.  Please keep an eye out for any leg swelling, worsening leg pain, or symptoms of shortness of breath and contact the clinic if you have any of these issues.  In regards to your diabetes please continue to increase the Levemir by 2 units when her blood glucose is greater than 250 consistently.  Continue to check her blood sugars as she is able to.  Please return to clinic in 1 week or sooner if needed.   Thank you again for coming in.   [name].D.

## 2018-12-05 ENCOUNTER — Telehealth: Payer: Self-pay | Admitting: Internal Medicine

## 2018-12-05 DIAGNOSIS — L02215 Cutaneous abscess of perineum: Secondary | ICD-10-CM | POA: Insufficient documentation

## 2018-12-05 NOTE — Telephone Encounter (Signed)
Pt daughter would like a callback from a nurse regarding mom medicine [phone]

## 2018-12-05 NOTE — Telephone Encounter (Signed)
Pt's daughter is requesting a hoyer lift; pt is very weak. And she and her father having difficulty moving pt. Thanks

## 2018-12-05 NOTE — Assessment & Plan Note (Signed)
She is currently on Levemir 18 units daily and increasing by 2 units if CBGs are persistently>250.  Daughter reports that the patient does not always allow her to check the blood sugars and that she does not know why they hit his head overdosed on when she went to the hospital.  We did went over the discharge instructions and why these medications were adjusted.  Advised daughter to continue to increase the insulin by 2 units as needed and to try to check it at least daily.  -Continue Levemir 18 units daily, increase by 2 units if CBGs persistently>250

## 2018-12-05 NOTE — Assessment & Plan Note (Signed)
Daughter reports that since patients discharge on 11/27/18 she has noticed a lesion on the right inner thigh area, patient/, close to the left that.,  Patient is a new wound that had started draining some yellowish sputum.  On 5/8 patient called in and was given a prescription for doxycycline and advised to hold Xarelto.  Daughter reports that the drainage has improved but is still present occasionally, the daughter has been cleaning the area with some water and try to keep the please clean however patient has a history of urinary incontinence and that area often will get soiled.  The patient has not been having any fevers, chills, nausea, vomiting, diarrhea, change in appetite, chest pain, shortness of breath, wheezing, or cough. Reports that a few years ago the patient had a similar lesion around the same area, a had a drain placed at that time. On exam patient is hemodynamically stable, afebrile, and has a open wound on the right perineal area, with a grayish color mass protruding out.  This appears to possibly have been an abscess that is now draining and the gray mass appears to be the encapsulated portion. We removed a portion of the gray material with scissors and tweezers, packed the area with iodine packing and covered it with a bandage and then with Dermabond to attempt to keep the area clean.  Educated daughter on how to perform this packing and to remove this in 3 days.  We offered to get home health involved to assist however patient's daughter declined.  Plan -Status post packing and coverage with bandage and Dermabound -Advised daughter to change out packing and dressing in 3 days -Follow-up in 1 week

## 2018-12-05 NOTE — Assessment & Plan Note (Signed)
Patient is very frail and has a history of dementia.  She has had some weight loss and weight today is 105 pounds.  Patient's daughter and patient's husband assists with all of patient's ADLs.  On exam she is very frail appearing and has muscle atrophy.  Patient's daughter is requesting for a Michiel Sites lift, and I feel that this is reasonable and would be beneficial for the movement of the patient at her house.  -DME order for [employer]

## 2018-12-05 NOTE — Telephone Encounter (Signed)
pallative care nurse calls and states she got after hrs call, per family pt has pulled all the drsgs and packing out of wound and is picking at it. traci ask for suggestions to stop this, family refuses mittens Spoke w/ sharon p. And we have no ideas except mittens, have suggested regular mittens and possible pants of some sort. She will eval and update as needed traci [phone]

## 2018-12-06 NOTE — Telephone Encounter (Signed)
Returned call to daughter, states no preference for agency to supply hoyer lift. Placed call to [facility] at [facility]. She states she can have this delivered to patient today.  Daughter states original packing at right groin wound is still in place and wonders if this needs to be changed tomorrow as dressing was just placed today after patient pulled previous dressing off. Will route to Providers and clinic wound RN to advise. Kinnie Feil, RN, BSN

## 2018-12-06 NOTE — Telephone Encounter (Signed)
I would wait one more day and then change the packing.

## 2018-12-06 NOTE — Telephone Encounter (Signed)
Daughter notified to hold dressing change/packing till sat 12/08/2018. States she will call hospice nurse to see if she can come Sat to help with dressing change. Kinnie Feil, RN, BSN

## 2018-12-07 ENCOUNTER — Telehealth: Payer: Self-pay | Admitting: Pharmacist

## 2018-12-07 NOTE — Progress Notes (Signed)
ASSESSMENT: Date Discharged from Hospital: 11/29/2018 Date Medication Reconciliation Performed: 12/07/2018  Medications:  New at Discharge: . Insulin titration  Adjustments at Discharge: . Insulin titration, increase Levemir by 2 units if BG > 250  Discontinued at Discharge:   None, Xarelto was discontinued at follow up due to duration of anticoagulation 2 years  Spoke to patient's daughter, Gavin Pound, who manages patient's medications at home. Patient is unable to manage medications without assistance, daughter reports DKA occurred while she was sick.  Patient was seen in clinic for hospital follow up 12/05/2018. Yesterday, patient's blood glucose was 80 in the morning (after taking 12 units of Levemir the day before), took 10 units of Levemir. Patient is only allowing 1 BG test per day at home. Daughter is adjusting insulin dose based on patient's home BG result. Has not checked BG yet this morning. Patient does not have symptoms of concern, daughter reports positive patient behavior since hospital discharge. We discussed lower extremity pain (daughter states patient may be having leg cramps) and possible contributing factors. No issues or concerns with medications today.  Patient was recently discharged from hospital and all medications have been reviewed.

## 2018-12-07 NOTE — Addendum Note (Signed)
Addended by: Debe Coder B on: 12/07/2018 03:09 PM   Modules accepted: Level of Service

## 2018-12-07 NOTE — Progress Notes (Signed)
Internal Medicine Clinic Attending  I saw and evaluated the patient.  I personally confirmed the key portions of the history and exam documented by Dr. Gwyneth Revels and I reviewed pertinent patient test results.  The assessment, diagnosis, and plan were formulated together and I agree with the documentation in the resident's note.      I was present and assisted in procedure noted in Dr. Sylvester Harder note.

## 2018-12-10 ENCOUNTER — Other Ambulatory Visit: Payer: Self-pay | Admitting: Internal Medicine

## 2018-12-10 ENCOUNTER — Telehealth: Payer: Self-pay | Admitting: *Deleted

## 2018-12-10 DIAGNOSIS — L89219 Pressure ulcer of right hip, unspecified stage: Secondary | ICD-10-CM

## 2018-12-10 NOTE — Telephone Encounter (Signed)
[facility] nurse request [facility] wound care nurse referral to address wound, pt's daughter reports that pt is constantly picking at and has removed drsg again. [facility] feels that wound care nurse may be able to provide more care to aid in healing.sending to dr Lovenia Kim.

## 2018-12-10 NOTE — Telephone Encounter (Signed)
Sharon Butler ([facility]) calls back and states daughter called her again and said she bathed pt today and the wound is draining greenish, foul smelling liquid. States no fevers at present. Will call pt's daughter in am and set appt up

## 2018-12-10 NOTE — Telephone Encounter (Signed)
Agree  Thank you

## 2018-12-10 NOTE — Telephone Encounter (Signed)
HH order placed 

## 2018-12-11 NOTE — Telephone Encounter (Signed)
Received call back from [facility] with [facility] who is covering for [place] at this time. She will look in to this order and call back with status update. Kinnie Feil, RN, BSN

## 2018-12-11 NOTE — Telephone Encounter (Signed)
Daughter has no preference for [facility].Spoke with Rhunette Croft with [facility] who is in network with [facility]. Eber Jones will check with her Data processing manager to see if she can take patient. Kinnie Feil, RN, BSN

## 2018-12-11 NOTE — Telephone Encounter (Signed)
Spoke w/ pt's daughter this am, she has been giving tylenol for pain so she has no idea about fevers, she states the odor from wound is worse than rotten meat. ACC 5/20 at 1445. Also hoyer lift has not been delivered

## 2018-12-11 NOTE — Telephone Encounter (Addendum)
Had received message from Velna Hatchet at [facility] that they do not have contract with [facility] so are unable to supply [place] lift. Spoke with French Ana at [employer]. Patient is not receiving hospice services only palliative care through [facility] which is a free service through [facility]. They do not bill insurance so there should be no problem with [facility] supplying hoyer lift. Have left message for Carlos Levering at [facility]. Awaiting return call. Kinnie Feil, RN, BSN

## 2018-12-11 NOTE — Telephone Encounter (Addendum)
Please see phone note started 12/10/2018 for [facility] RN. Kinnie Feil, RN, BSN

## 2018-12-12 ENCOUNTER — Ambulatory Visit (INDEPENDENT_AMBULATORY_CARE_PROVIDER_SITE_OTHER): Payer: Medicare HMO | Admitting: Internal Medicine

## 2018-12-12 ENCOUNTER — Other Ambulatory Visit: Payer: Self-pay

## 2018-12-12 VITALS — BP 143/54 | HR 69 | Temp 98.0°F

## 2018-12-12 DIAGNOSIS — R262 Difficulty in walking, not elsewhere classified: Secondary | ICD-10-CM | POA: Diagnosis not present

## 2018-12-12 DIAGNOSIS — R69 Illness, unspecified: Secondary | ICD-10-CM | POA: Diagnosis not present

## 2018-12-12 DIAGNOSIS — E440 Moderate protein-calorie malnutrition: Secondary | ICD-10-CM | POA: Diagnosis not present

## 2018-12-12 DIAGNOSIS — L89219 Pressure ulcer of right hip, unspecified stage: Secondary | ICD-10-CM

## 2018-12-12 LAB — GLUCOSE, CAPILLARY: Glucose-Capillary: 224 mg/dL — ABNORMAL HIGH (ref 70–99)

## 2018-12-12 NOTE — Progress Notes (Signed)
   CC: Pressure injury of right hip  HPI:  [name] is a 85 y.o.   Past Medical History:  Diagnosis Date  . Alzheimer's dementia (HCC)    "dx'd 05/2015; final stages" (06/15/2016)  . Complication of anesthesia    "problems waking up from gallbladder OR in 1978"  . COPD (chronic obstructive pulmonary disease) (HCC)   . High cholesterol   . History of blood transfusion 1996   "related to appendix OR"  . Infected surgical wound 07/2016   rt hip  . Type II diabetes mellitus (HCC) 1998   Review of Systems: Reports right hip pain, and decreased appetite.  Denies fevers, chills, nausea, vomiting, headaches, lightheadedness, dizziness, or other symptoms.  Physical Exam:  Vitals:   12/12/18 1510  BP: (!) 143/54  Pulse: 69  Temp: 98 F (36.7 C)  TempSrc: Oral  SpO2: 96%   General: Elderly appearing female, no acute distress Cardiac: Regular rate and rhythm, no murmurs rubs or gallops Pulmonary: CTA BL, no wheezing or rhonchi Skin: Open wound over right medial hip area, retained packing material, no significant drainage or bleeding      Assessment & Plan:   See Encounters Tab for problem based charting.  Patient discussed with Dr. Heide Spark

## 2018-12-12 NOTE — Telephone Encounter (Signed)
Received message from Rhunette Croft with [facility] that they will take patient for [facility] RN wound care. Will call back with SOC date. Kinnie Feil, RN, BSN

## 2018-12-12 NOTE — Telephone Encounter (Signed)
Spoke with Rhunette Croft with [facility] after today's dressing change. Provider would like the dressing changed every 3 days. Eber Jones will Have a HH RN to patient's home on Sat 12/15/2018. Eber Jones requesting specific dressing change requirements added to today's provider note. Kinnie Feil, RN, BSN

## 2018-12-12 NOTE — Telephone Encounter (Signed)
Received call from [facility] with [facility] [employer] [facility] that they have processed order for hoyer lift and will be able to deliver it today. Kinnie Feil, RN, BSN

## 2018-12-12 NOTE — Patient Instructions (Addendum)
Ms. Cathelene Bilbao,  It was a pleasure to see you today. Thank you for coming in.   Today we discussed your right groin area wound. In regards to this please keep changing the wound packing out every 3 days. I have placed an order for home health to come in to help with wound changing in 3 days. Please continue to keep the area clean and dry and to limit movement around that area. If you start to notice any worsening drainage, more pain, or more bleeding please contact the clinic and let us know.   Please return to clinic in 2 weeks or sooner if needed.   Thank you again for coming in.   [name].D.

## 2018-12-12 NOTE — Telephone Encounter (Signed)
Thank you :)

## 2018-12-13 NOTE — Telephone Encounter (Signed)
That sounds good. Thank you. 

## 2018-12-13 NOTE — Assessment & Plan Note (Signed)
Patient has a pressure injury on the medial hip area, see picture in note, she was seen 1 week ago and had the area cleaned and packed with iodoform, this was covered with a soft silicone gel adhesive foam dressing, and then covered with a derma pound.  Daughter was advised to change the dressing out every 3 days.  Daughter reports that the wound care has not been going well, patient has a lot of pain with moving her right hip and the packing and bandages have, about 3 times, daughter does report that the area was changed yesterday and the daughter tried to pack it but she thinks patient had pulled it out.  On exam today she does have the open wounds with some retained packing material, she has decreased drainage and yellowish material in the wounds.  She denies any systemic symptoms such as fevers, chills, nausea, vomiting, headaches, lightheadedness, dizziness.  Does report that she has been having a poor appetite.   Dressing and packing was changed today: -Placed patient in a supine position, lifted right hip at a 90 degree angle to expose right medial hip wound.  Assistance was used to tighten the skin around the area to allow for good visualization.  A tweezer was used to remove the retained packing material and remove some yellowish soft material from.  Clean saline was poured over the wound to clean the area, with gauzes held under the wound to catch the saline.  A clean set of tweezers was used to place iodoform packing into the wound, folding the packing material to occupy approximately 80% of the space.  Once the packing material was in place it was cut so that 2-3 cm of the strip was left outside.  A silicone gel adhesive hydro-cellular foam dressing was cut to size and placed over the wound.  A Tegaderm film was then placed over the dressing.  Plan: -Patient will need to have the dressing changed every 3 days, daughter was amenable to having home health services to assist with this. -Home health  referral for RN to assist with wound care -Return to clinic in 2 weeks to follow-up, advised daughter that if symptoms were to worsen or she develops any systemic symptoms to return to clinic sooner

## 2018-12-13 NOTE — Progress Notes (Signed)
Internal Medicine Clinic Attending  Case discussed with Dr. Krienke at the time of the visit.  We reviewed the resident's history and exam and pertinent patient test results.  I agree with the assessment, diagnosis, and plan of care documented in the resident's note.    

## 2018-12-15 DIAGNOSIS — Z794 Long term (current) use of insulin: Secondary | ICD-10-CM | POA: Diagnosis not present

## 2018-12-15 DIAGNOSIS — R69 Illness, unspecified: Secondary | ICD-10-CM | POA: Diagnosis not present

## 2018-12-15 DIAGNOSIS — Z87440 Personal history of urinary (tract) infections: Secondary | ICD-10-CM | POA: Diagnosis not present

## 2018-12-15 DIAGNOSIS — J449 Chronic obstructive pulmonary disease, unspecified: Secondary | ICD-10-CM | POA: Diagnosis not present

## 2018-12-15 DIAGNOSIS — G309 Alzheimer's disease, unspecified: Secondary | ICD-10-CM | POA: Diagnosis not present

## 2018-12-15 DIAGNOSIS — L02215 Cutaneous abscess of perineum: Secondary | ICD-10-CM | POA: Diagnosis not present

## 2018-12-15 DIAGNOSIS — E1165 Type 2 diabetes mellitus with hyperglycemia: Secondary | ICD-10-CM | POA: Diagnosis not present

## 2018-12-15 DIAGNOSIS — Z9181 History of falling: Secondary | ICD-10-CM | POA: Diagnosis not present

## 2018-12-15 DIAGNOSIS — E440 Moderate protein-calorie malnutrition: Secondary | ICD-10-CM | POA: Diagnosis not present

## 2018-12-15 DIAGNOSIS — R1310 Dysphagia, unspecified: Secondary | ICD-10-CM | POA: Diagnosis not present

## 2018-12-18 ENCOUNTER — Telehealth: Payer: Self-pay | Admitting: *Deleted

## 2018-12-18 NOTE — Telephone Encounter (Signed)
Spoke w/ daughter at length, reassured, she is not truly pleased with medi home but will give time

## 2018-12-18 NOTE — Telephone Encounter (Signed)
Tresa Endo, RN with [facility] called to say she saw patient on 12/15/2018 for right groin wound. States there is good pink tissue with minimal sloughing of abscess' casing. Referral stated right hip wound. Needs VO for right groin wound as there is no wound at right hip. Verbal auth given. Kinnie Feil, RN, BSN

## 2018-12-18 NOTE — Telephone Encounter (Signed)
Pt's daughter states the medi [facility] people came one day and she has not heard from them since

## 2018-12-19 DIAGNOSIS — J449 Chronic obstructive pulmonary disease, unspecified: Secondary | ICD-10-CM | POA: Diagnosis not present

## 2018-12-19 DIAGNOSIS — Z9181 History of falling: Secondary | ICD-10-CM | POA: Diagnosis not present

## 2018-12-19 DIAGNOSIS — L02215 Cutaneous abscess of perineum: Secondary | ICD-10-CM | POA: Diagnosis not present

## 2018-12-19 DIAGNOSIS — Z794 Long term (current) use of insulin: Secondary | ICD-10-CM | POA: Diagnosis not present

## 2018-12-19 DIAGNOSIS — R69 Illness, unspecified: Secondary | ICD-10-CM | POA: Diagnosis not present

## 2018-12-19 DIAGNOSIS — G309 Alzheimer's disease, unspecified: Secondary | ICD-10-CM | POA: Diagnosis not present

## 2018-12-19 DIAGNOSIS — R1310 Dysphagia, unspecified: Secondary | ICD-10-CM | POA: Diagnosis not present

## 2018-12-19 DIAGNOSIS — E1165 Type 2 diabetes mellitus with hyperglycemia: Secondary | ICD-10-CM | POA: Diagnosis not present

## 2018-12-19 DIAGNOSIS — Z87440 Personal history of urinary (tract) infections: Secondary | ICD-10-CM | POA: Diagnosis not present

## 2018-12-19 DIAGNOSIS — E440 Moderate protein-calorie malnutrition: Secondary | ICD-10-CM | POA: Diagnosis not present

## 2018-12-20 ENCOUNTER — Telehealth: Payer: Self-pay

## 2018-12-20 NOTE — Telephone Encounter (Signed)
Requesting to speak with Sharon Butler. Please call back.  

## 2018-12-20 NOTE — Telephone Encounter (Signed)
Thank you :)

## 2018-12-20 NOTE — Telephone Encounter (Signed)
rtc to pt's daughter, she states HH wound care is still not as consistent as she would like and a different person comes every time and has a different view on care and progress. She continues to be happy with [facility] and tracie the nurse from there. Will be in for appt 6/3. She is reassured.

## 2018-12-21 DIAGNOSIS — Z794 Long term (current) use of insulin: Secondary | ICD-10-CM | POA: Diagnosis not present

## 2018-12-21 DIAGNOSIS — Z87440 Personal history of urinary (tract) infections: Secondary | ICD-10-CM | POA: Diagnosis not present

## 2018-12-21 DIAGNOSIS — E440 Moderate protein-calorie malnutrition: Secondary | ICD-10-CM | POA: Diagnosis not present

## 2018-12-21 DIAGNOSIS — Z9181 History of falling: Secondary | ICD-10-CM | POA: Diagnosis not present

## 2018-12-21 DIAGNOSIS — R69 Illness, unspecified: Secondary | ICD-10-CM | POA: Diagnosis not present

## 2018-12-21 DIAGNOSIS — G309 Alzheimer's disease, unspecified: Secondary | ICD-10-CM | POA: Diagnosis not present

## 2018-12-21 DIAGNOSIS — L02215 Cutaneous abscess of perineum: Secondary | ICD-10-CM | POA: Diagnosis not present

## 2018-12-21 DIAGNOSIS — E1165 Type 2 diabetes mellitus with hyperglycemia: Secondary | ICD-10-CM | POA: Diagnosis not present

## 2018-12-21 DIAGNOSIS — R1310 Dysphagia, unspecified: Secondary | ICD-10-CM | POA: Diagnosis not present

## 2018-12-21 DIAGNOSIS — J449 Chronic obstructive pulmonary disease, unspecified: Secondary | ICD-10-CM | POA: Diagnosis not present

## 2018-12-26 ENCOUNTER — Ambulatory Visit (INDEPENDENT_AMBULATORY_CARE_PROVIDER_SITE_OTHER): Payer: Medicare HMO | Admitting: Internal Medicine

## 2018-12-26 ENCOUNTER — Encounter: Payer: Self-pay | Admitting: Internal Medicine

## 2018-12-26 ENCOUNTER — Other Ambulatory Visit: Payer: Self-pay

## 2018-12-26 DIAGNOSIS — L89899 Pressure ulcer of other site, unspecified stage: Secondary | ICD-10-CM

## 2018-12-26 DIAGNOSIS — F0390 Unspecified dementia without behavioral disturbance: Secondary | ICD-10-CM

## 2018-12-26 DIAGNOSIS — L89219 Pressure ulcer of right hip, unspecified stage: Secondary | ICD-10-CM

## 2018-12-26 DIAGNOSIS — R69 Illness, unspecified: Secondary | ICD-10-CM | POA: Diagnosis not present

## 2018-12-26 NOTE — Assessment & Plan Note (Addendum)
Improving. Daughter has been doing dressing changes with assistance of HH every few days. Daughter reports difficulty getting her mom to keep the dressings in place given her underlying dementia. She is pleased if the dressing stays on for more than one day. HH has been assisting with packing the wound twice a week and the daughter does daily dressing changes. She denies increased discharge or foul odor. Sharon Butler still endorses pain with movement and dressing changes but it is overall improving. The wound was probed and had minimal discharge. Healthy pink granulomatous tissue was present inside the wound. No signs of infection. The wound was covered with gauze.   - continue current HH and dressing changes - f/u in 3-4 weeks with PCP

## 2018-12-26 NOTE — Progress Notes (Signed)
   CC: wound check   HPI:  [name] is a 85 y.o. F with PMHx as listed below who presents for wound check.   Please see encounter tab for full details of HPI  Past Medical History:  Diagnosis Date  . Alzheimer's dementia (HCC)    "dx'd 05/2015; final stages" (06/15/2016)  . Complication of anesthesia    "problems waking up from gallbladder OR in 1978"  . COPD (chronic obstructive pulmonary disease) (HCC)   . High cholesterol   . History of blood transfusion 1996   "related to appendix OR"  . Infected surgical wound 07/2016   rt hip  . Type II diabetes mellitus (HCC) 1998    Physical Exam:  Vitals:   12/26/18 1439  BP: (!) 153/99  Pulse: 68  Temp: 98.5 F (36.9 C)  SpO2: 98%  Height: 5\' 1"  (1.549 m)   Gen: well appearing elderly female Skin: right groin wound 2cm in length without significant depth when probed. Minimal discharge and pain with palpation, no foul odor, no foreign bodies or excess tissue present inside the wound.    Assessment & Plan:   See Encounters Tab for problem based charting.  Patient discussed with Dr. Rogelia Boga

## 2018-12-26 NOTE — Patient Instructions (Addendum)
It was nice seeing you today. Thank you for choosing [facility] for your Primary Care.   Your wound is looking much better! Continue wound care at home. If you experience fevers, nausea, vomiting, or increased pain let us know. Otherwise, follow up in one month with your PCP   FOLLOW-UP INSTRUCTIONS When: at the end of this month with PCP For: wound, diabetes What to bring: all medications   Please contact the clinic if you have any problems, or need to be seen sooner.

## 2018-12-28 DIAGNOSIS — E1165 Type 2 diabetes mellitus with hyperglycemia: Secondary | ICD-10-CM | POA: Diagnosis not present

## 2018-12-28 DIAGNOSIS — R1310 Dysphagia, unspecified: Secondary | ICD-10-CM | POA: Diagnosis not present

## 2018-12-28 DIAGNOSIS — J449 Chronic obstructive pulmonary disease, unspecified: Secondary | ICD-10-CM | POA: Diagnosis not present

## 2018-12-28 DIAGNOSIS — Z9181 History of falling: Secondary | ICD-10-CM | POA: Diagnosis not present

## 2018-12-28 DIAGNOSIS — L02215 Cutaneous abscess of perineum: Secondary | ICD-10-CM | POA: Diagnosis not present

## 2018-12-28 DIAGNOSIS — Z794 Long term (current) use of insulin: Secondary | ICD-10-CM | POA: Diagnosis not present

## 2018-12-28 DIAGNOSIS — E440 Moderate protein-calorie malnutrition: Secondary | ICD-10-CM | POA: Diagnosis not present

## 2018-12-28 DIAGNOSIS — G309 Alzheimer's disease, unspecified: Secondary | ICD-10-CM | POA: Diagnosis not present

## 2018-12-28 DIAGNOSIS — R69 Illness, unspecified: Secondary | ICD-10-CM | POA: Diagnosis not present

## 2018-12-28 DIAGNOSIS — Z87440 Personal history of urinary (tract) infections: Secondary | ICD-10-CM | POA: Diagnosis not present

## 2018-12-28 NOTE — Progress Notes (Signed)
Internal Medicine Clinic Attending  Case discussed with Dr. Vogel  at the time of the visit.  We reviewed the resident's history and exam and pertinent patient test results.  I agree with the assessment, diagnosis, and plan of care documented in the resident's note.  

## 2018-12-31 DIAGNOSIS — S71101A Unspecified open wound, right thigh, initial encounter: Secondary | ICD-10-CM | POA: Diagnosis not present

## 2019-01-01 DIAGNOSIS — Z794 Long term (current) use of insulin: Secondary | ICD-10-CM | POA: Diagnosis not present

## 2019-01-01 DIAGNOSIS — Z87440 Personal history of urinary (tract) infections: Secondary | ICD-10-CM | POA: Diagnosis not present

## 2019-01-01 DIAGNOSIS — E1165 Type 2 diabetes mellitus with hyperglycemia: Secondary | ICD-10-CM | POA: Diagnosis not present

## 2019-01-01 DIAGNOSIS — R69 Illness, unspecified: Secondary | ICD-10-CM | POA: Diagnosis not present

## 2019-01-01 DIAGNOSIS — R1310 Dysphagia, unspecified: Secondary | ICD-10-CM | POA: Diagnosis not present

## 2019-01-01 DIAGNOSIS — G309 Alzheimer's disease, unspecified: Secondary | ICD-10-CM | POA: Diagnosis not present

## 2019-01-01 DIAGNOSIS — J449 Chronic obstructive pulmonary disease, unspecified: Secondary | ICD-10-CM | POA: Diagnosis not present

## 2019-01-01 DIAGNOSIS — L02215 Cutaneous abscess of perineum: Secondary | ICD-10-CM | POA: Diagnosis not present

## 2019-01-01 DIAGNOSIS — Z9181 History of falling: Secondary | ICD-10-CM | POA: Diagnosis not present

## 2019-01-01 DIAGNOSIS — E440 Moderate protein-calorie malnutrition: Secondary | ICD-10-CM | POA: Diagnosis not present

## 2019-01-04 DIAGNOSIS — E440 Moderate protein-calorie malnutrition: Secondary | ICD-10-CM | POA: Diagnosis not present

## 2019-01-04 DIAGNOSIS — E1165 Type 2 diabetes mellitus with hyperglycemia: Secondary | ICD-10-CM | POA: Diagnosis not present

## 2019-01-04 DIAGNOSIS — Z794 Long term (current) use of insulin: Secondary | ICD-10-CM | POA: Diagnosis not present

## 2019-01-04 DIAGNOSIS — R69 Illness, unspecified: Secondary | ICD-10-CM | POA: Diagnosis not present

## 2019-01-04 DIAGNOSIS — L02215 Cutaneous abscess of perineum: Secondary | ICD-10-CM | POA: Diagnosis not present

## 2019-01-04 DIAGNOSIS — R1310 Dysphagia, unspecified: Secondary | ICD-10-CM | POA: Diagnosis not present

## 2019-01-04 DIAGNOSIS — Z9181 History of falling: Secondary | ICD-10-CM | POA: Diagnosis not present

## 2019-01-04 DIAGNOSIS — Z87440 Personal history of urinary (tract) infections: Secondary | ICD-10-CM | POA: Diagnosis not present

## 2019-01-04 DIAGNOSIS — G309 Alzheimer's disease, unspecified: Secondary | ICD-10-CM | POA: Diagnosis not present

## 2019-01-04 DIAGNOSIS — J449 Chronic obstructive pulmonary disease, unspecified: Secondary | ICD-10-CM | POA: Diagnosis not present

## 2019-01-08 DIAGNOSIS — R69 Illness, unspecified: Secondary | ICD-10-CM | POA: Diagnosis not present

## 2019-01-08 DIAGNOSIS — Z9181 History of falling: Secondary | ICD-10-CM | POA: Diagnosis not present

## 2019-01-08 DIAGNOSIS — E440 Moderate protein-calorie malnutrition: Secondary | ICD-10-CM | POA: Diagnosis not present

## 2019-01-08 DIAGNOSIS — G309 Alzheimer's disease, unspecified: Secondary | ICD-10-CM | POA: Diagnosis not present

## 2019-01-08 DIAGNOSIS — Z794 Long term (current) use of insulin: Secondary | ICD-10-CM | POA: Diagnosis not present

## 2019-01-08 DIAGNOSIS — Z87440 Personal history of urinary (tract) infections: Secondary | ICD-10-CM | POA: Diagnosis not present

## 2019-01-08 DIAGNOSIS — E1165 Type 2 diabetes mellitus with hyperglycemia: Secondary | ICD-10-CM | POA: Diagnosis not present

## 2019-01-08 DIAGNOSIS — L02215 Cutaneous abscess of perineum: Secondary | ICD-10-CM | POA: Diagnosis not present

## 2019-01-08 DIAGNOSIS — J449 Chronic obstructive pulmonary disease, unspecified: Secondary | ICD-10-CM | POA: Diagnosis not present

## 2019-01-08 DIAGNOSIS — R1310 Dysphagia, unspecified: Secondary | ICD-10-CM | POA: Diagnosis not present

## 2019-01-11 DIAGNOSIS — Z794 Long term (current) use of insulin: Secondary | ICD-10-CM | POA: Diagnosis not present

## 2019-01-11 DIAGNOSIS — Z9181 History of falling: Secondary | ICD-10-CM | POA: Diagnosis not present

## 2019-01-11 DIAGNOSIS — Z87440 Personal history of urinary (tract) infections: Secondary | ICD-10-CM | POA: Diagnosis not present

## 2019-01-11 DIAGNOSIS — E1165 Type 2 diabetes mellitus with hyperglycemia: Secondary | ICD-10-CM | POA: Diagnosis not present

## 2019-01-11 DIAGNOSIS — R1310 Dysphagia, unspecified: Secondary | ICD-10-CM | POA: Diagnosis not present

## 2019-01-11 DIAGNOSIS — R69 Illness, unspecified: Secondary | ICD-10-CM | POA: Diagnosis not present

## 2019-01-11 DIAGNOSIS — G309 Alzheimer's disease, unspecified: Secondary | ICD-10-CM | POA: Diagnosis not present

## 2019-01-11 DIAGNOSIS — E440 Moderate protein-calorie malnutrition: Secondary | ICD-10-CM | POA: Diagnosis not present

## 2019-01-11 DIAGNOSIS — J449 Chronic obstructive pulmonary disease, unspecified: Secondary | ICD-10-CM | POA: Diagnosis not present

## 2019-01-11 DIAGNOSIS — L02215 Cutaneous abscess of perineum: Secondary | ICD-10-CM | POA: Diagnosis not present

## 2019-01-12 DIAGNOSIS — E440 Moderate protein-calorie malnutrition: Secondary | ICD-10-CM | POA: Diagnosis not present

## 2019-01-12 DIAGNOSIS — R69 Illness, unspecified: Secondary | ICD-10-CM | POA: Diagnosis not present

## 2019-01-12 DIAGNOSIS — R262 Difficulty in walking, not elsewhere classified: Secondary | ICD-10-CM | POA: Diagnosis not present

## 2019-01-14 ENCOUNTER — Other Ambulatory Visit: Payer: Self-pay

## 2019-01-14 ENCOUNTER — Encounter: Payer: Self-pay | Admitting: Internal Medicine

## 2019-01-14 ENCOUNTER — Ambulatory Visit (INDEPENDENT_AMBULATORY_CARE_PROVIDER_SITE_OTHER): Payer: Medicare HMO | Admitting: Internal Medicine

## 2019-01-14 VITALS — BP 130/74 | HR 55 | Temp 99.1°F | Ht 61.0 in

## 2019-01-14 DIAGNOSIS — Z794 Long term (current) use of insulin: Secondary | ICD-10-CM

## 2019-01-14 DIAGNOSIS — L02215 Cutaneous abscess of perineum: Secondary | ICD-10-CM | POA: Diagnosis not present

## 2019-01-14 DIAGNOSIS — E1165 Type 2 diabetes mellitus with hyperglycemia: Secondary | ICD-10-CM

## 2019-01-14 DIAGNOSIS — E119 Type 2 diabetes mellitus without complications: Secondary | ICD-10-CM

## 2019-01-14 NOTE — Patient Instructions (Addendum)
Continue dressing changes as you are doing unless wound care recommends something different.   Continue titrating her Levemir at home as you are doing. Controlling diabetes is important for wound care.   Schedule an appointment with me in 2 weeks to check up on your wound or sooner if needed.   - Dr. Frederico Hamman

## 2019-01-15 ENCOUNTER — Encounter: Payer: Self-pay | Admitting: Internal Medicine

## 2019-01-15 DIAGNOSIS — E440 Moderate protein-calorie malnutrition: Secondary | ICD-10-CM | POA: Diagnosis not present

## 2019-01-15 DIAGNOSIS — R1310 Dysphagia, unspecified: Secondary | ICD-10-CM | POA: Diagnosis not present

## 2019-01-15 DIAGNOSIS — Z9181 History of falling: Secondary | ICD-10-CM | POA: Diagnosis not present

## 2019-01-15 DIAGNOSIS — R69 Illness, unspecified: Secondary | ICD-10-CM | POA: Diagnosis not present

## 2019-01-15 DIAGNOSIS — G309 Alzheimer's disease, unspecified: Secondary | ICD-10-CM | POA: Diagnosis not present

## 2019-01-15 DIAGNOSIS — E1165 Type 2 diabetes mellitus with hyperglycemia: Secondary | ICD-10-CM | POA: Diagnosis not present

## 2019-01-15 DIAGNOSIS — Z794 Long term (current) use of insulin: Secondary | ICD-10-CM | POA: Diagnosis not present

## 2019-01-15 DIAGNOSIS — Z87440 Personal history of urinary (tract) infections: Secondary | ICD-10-CM | POA: Diagnosis not present

## 2019-01-15 DIAGNOSIS — L02215 Cutaneous abscess of perineum: Secondary | ICD-10-CM | POA: Diagnosis not present

## 2019-01-15 DIAGNOSIS — J449 Chronic obstructive pulmonary disease, unspecified: Secondary | ICD-10-CM | POA: Diagnosis not present

## 2019-01-15 NOTE — Progress Notes (Signed)
   CC: Wound and T2DM follow up   HPI:  [name] is a 85 y.o. year-old female with PMH listed below who presents to clinic for wound and T2DM follow up. Please see problem based assessment and plan for further details.   Past Medical History:  Diagnosis Date  . Alzheimer's dementia ([place])    "dx'd 05/2015; final stages" (06/15/2016)  . Complication of anesthesia    "problems waking up from gallbladder OR in 1978"  . COPD (chronic obstructive pulmonary disease) ([place])   . High cholesterol   . History of blood transfusion 1996   "related to appendix OR"  . Infected surgical wound 07/2016   rt hip  . Type II diabetes mellitus ([place]) 1998   Review of Systems:   Review of Systems  Constitutional: Negative for chills and fever.  Skin: Positive for itching. Negative for rash.    Physical Exam:  Vitals:   01/14/19 1533  BP: 130/74  Pulse: (!) 55  Temp: 99.1 F (37.3 C)  TempSrc: Oral  SpO2: 99%  Height: 5\' 1"  (1.549 m)   General: frail, elderly female, not very talkative, in no acute distress  Ext: warm and well perfused, no peripheral edema Derm:  Wound appears nearly closed, some hyperpigmentation around it but no erythema or warmth, much improved from 2 weeks ago    Assessment & Plan:   See Encounters Tab for problem based charting.  Patient discussed with Dr. Lynnae January

## 2019-01-15 NOTE — Assessment & Plan Note (Addendum)
Patient presents accompanied by daughter who is her caregiver for wound care follow-up.  She had a perineal abscess in her right inner thigh that was drained.  She has a wound care nurse that goes to her house every 2 to 3 days who has been packing the wound with great improvement.  Daughter has not noticed any signs or symptoms of infection.  On exam wound appears almost closed with some surrounding hyperpigmentation but no signs of infection.  Recommended we continue wound care recommendations as well as working glycemic control  for better healing. Follow up in 2 weeks.

## 2019-01-15 NOTE — Assessment & Plan Note (Signed)
Sharon Butler's diabetes has been difficult to manage due to noncompliance with CBG checks at home.  She is currently on Levemir 20 units daily in AM but her sugars remain above 200.  Her daughter will continue to uptitrate Levemir by 2 units if CBGs are persistently above 250 in the morning.  Otherwise would avoid frequent CBG checks as patient is moving towards hospice.  Reviewed importance of glycemic control for wound healing.  Foot exam performed today with no wounds noticed bilaterally.

## 2019-01-18 DIAGNOSIS — E440 Moderate protein-calorie malnutrition: Secondary | ICD-10-CM | POA: Diagnosis not present

## 2019-01-18 DIAGNOSIS — G309 Alzheimer's disease, unspecified: Secondary | ICD-10-CM | POA: Diagnosis not present

## 2019-01-18 DIAGNOSIS — Z794 Long term (current) use of insulin: Secondary | ICD-10-CM | POA: Diagnosis not present

## 2019-01-18 DIAGNOSIS — Z87440 Personal history of urinary (tract) infections: Secondary | ICD-10-CM | POA: Diagnosis not present

## 2019-01-18 DIAGNOSIS — R69 Illness, unspecified: Secondary | ICD-10-CM | POA: Diagnosis not present

## 2019-01-18 DIAGNOSIS — R1310 Dysphagia, unspecified: Secondary | ICD-10-CM | POA: Diagnosis not present

## 2019-01-18 DIAGNOSIS — J449 Chronic obstructive pulmonary disease, unspecified: Secondary | ICD-10-CM | POA: Diagnosis not present

## 2019-01-18 DIAGNOSIS — E1165 Type 2 diabetes mellitus with hyperglycemia: Secondary | ICD-10-CM | POA: Diagnosis not present

## 2019-01-18 DIAGNOSIS — Z9181 History of falling: Secondary | ICD-10-CM | POA: Diagnosis not present

## 2019-01-18 DIAGNOSIS — L02215 Cutaneous abscess of perineum: Secondary | ICD-10-CM | POA: Diagnosis not present

## 2019-01-18 NOTE — Progress Notes (Signed)
Internal Medicine Clinic Attending  Case discussed with Dr. Santos at the time of the visit.  We reviewed the resident's history and exam and pertinent patient test results.  I agree with the assessment, diagnosis, and plan of care documented in the resident's note.    

## 2019-01-24 ENCOUNTER — Other Ambulatory Visit: Payer: Self-pay | Admitting: Internal Medicine

## 2019-01-24 DIAGNOSIS — E1165 Type 2 diabetes mellitus with hyperglycemia: Secondary | ICD-10-CM

## 2019-02-04 ENCOUNTER — Encounter: Payer: Medicare HMO | Admitting: Internal Medicine

## 2019-02-11 ENCOUNTER — Encounter: Payer: Self-pay | Admitting: Internal Medicine

## 2019-02-11 ENCOUNTER — Other Ambulatory Visit: Payer: Self-pay

## 2019-02-11 ENCOUNTER — Ambulatory Visit (INDEPENDENT_AMBULATORY_CARE_PROVIDER_SITE_OTHER): Payer: Medicare HMO | Admitting: Internal Medicine

## 2019-02-11 VITALS — Wt 95.6 lb

## 2019-02-11 DIAGNOSIS — L02215 Cutaneous abscess of perineum: Secondary | ICD-10-CM

## 2019-02-11 DIAGNOSIS — R69 Illness, unspecified: Secondary | ICD-10-CM | POA: Diagnosis not present

## 2019-02-11 DIAGNOSIS — S41112A Laceration without foreign body of left upper arm, initial encounter: Secondary | ICD-10-CM | POA: Diagnosis not present

## 2019-02-11 DIAGNOSIS — Z7901 Long term (current) use of anticoagulants: Secondary | ICD-10-CM | POA: Diagnosis not present

## 2019-02-11 DIAGNOSIS — W19XXXA Unspecified fall, initial encounter: Secondary | ICD-10-CM

## 2019-02-11 DIAGNOSIS — W01198A Fall on same level from slipping, tripping and stumbling with subsequent striking against other object, initial encounter: Secondary | ICD-10-CM

## 2019-02-11 DIAGNOSIS — F0390 Unspecified dementia without behavioral disturbance: Secondary | ICD-10-CM

## 2019-02-11 DIAGNOSIS — Z86718 Personal history of other venous thrombosis and embolism: Secondary | ICD-10-CM | POA: Diagnosis not present

## 2019-02-11 DIAGNOSIS — R262 Difficulty in walking, not elsewhere classified: Secondary | ICD-10-CM | POA: Diagnosis not present

## 2019-02-11 DIAGNOSIS — E440 Moderate protein-calorie malnutrition: Secondary | ICD-10-CM | POA: Diagnosis not present

## 2019-02-11 NOTE — Patient Instructions (Signed)
Chelsye,   Que bueno que esta bien y la Scientific laboratory technician. Haga una cita de seguimiento conmigo en 1-2 meses o antes si lo necesita.   - Dra. Frederico Hamman

## 2019-02-13 NOTE — Assessment & Plan Note (Signed)
Patient presents for 4-week wound follow up. She is accompanied by her daughter who is her primary care taker. She states Meaghan has ben doing fairly well at home. She has been eating and interacting with her family as much as she can. She had no had any complaints of pain at the wound site. No fevers or chills. On exam, her wound is completely healed. There is only slight hyperpigmentation. No further intervention. Advised frequent turns when she is in bed.

## 2019-02-13 NOTE — Progress Notes (Signed)
   CC: Wound follow up  HPI:  Sharon Butler is a 85 y.o. year-old female with PMH listed below who presents to clinic for wound follow up. Please see problem based assessment and plan for further details.   Past Medical History:  Diagnosis Date  . Alzheimer's dementia ([place])    "dx'd 05/2015; final stages" (06/15/2016)  . Complication of anesthesia    "problems waking up from gallbladder OR in 1978"  . COPD (chronic obstructive pulmonary disease) ([place])   . High cholesterol   . History of blood transfusion 1996   "related to appendix OR"  . Infected surgical wound 07/2016   rt hip  . Type II diabetes mellitus ([place]) 1998   Review of Systems:   Review of Systems  Constitutional: Negative for chills, fever, malaise/fatigue and weight loss.  Skin:       Hyperpigmentation over area of previous abscess     Physical Exam:  Vitals:   02/11/19 1507  Weight: 95 lb 9.6 oz (43.4 kg)    General: Chronically ill-appearing female, mildly verbal at baseline, no acute distress Derm: There is hyperpigmentation over previously affected area, but no open wounds noted, no erythema or warmth   Assessment & Plan:   See Encounters Tab for problem based charting.  Patient discussed with Dr. Evette Doffing

## 2019-02-13 NOTE — Assessment & Plan Note (Addendum)
Daughter reports patient had a fall at home.  She tripped over a rug that has since been removed from the home.  She did not hit her head or lose consciousness.  She has a small laceration over her L arm that is bandaged. 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  Dried blood noted on the bandage but no active bleeding.  I have had a discussion with daughter about risk versus benefits of continuing Xarelto given her end-stage dementia and plan to transition to palliative care.  Daughter would like to continue Xarelto for now.  Advised her to monitor for any signs or symptoms of bleeding and to continue constant supervision for Sharon Butler.

## 2019-02-14 NOTE — Progress Notes (Signed)
Internal Medicine Clinic Attending  Case discussed with Dr. Santos-Sanchez soon after the resident saw the patient.  We reviewed the resident's history and exam and pertinent patient test results.  I agree with the assessment, diagnosis, and plan of care documented in the resident's note.   

## 2019-03-14 DIAGNOSIS — R262 Difficulty in walking, not elsewhere classified: Secondary | ICD-10-CM | POA: Diagnosis not present

## 2019-03-14 DIAGNOSIS — R69 Illness, unspecified: Secondary | ICD-10-CM | POA: Diagnosis not present

## 2019-03-14 DIAGNOSIS — E440 Moderate protein-calorie malnutrition: Secondary | ICD-10-CM | POA: Diagnosis not present

## 2019-04-14 DIAGNOSIS — E440 Moderate protein-calorie malnutrition: Secondary | ICD-10-CM | POA: Diagnosis not present

## 2019-04-14 DIAGNOSIS — R69 Illness, unspecified: Secondary | ICD-10-CM | POA: Diagnosis not present

## 2019-04-14 DIAGNOSIS — R262 Difficulty in walking, not elsewhere classified: Secondary | ICD-10-CM | POA: Diagnosis not present

## 2019-05-14 DIAGNOSIS — R69 Illness, unspecified: Secondary | ICD-10-CM | POA: Diagnosis not present

## 2019-05-14 DIAGNOSIS — R262 Difficulty in walking, not elsewhere classified: Secondary | ICD-10-CM | POA: Diagnosis not present

## 2019-05-14 DIAGNOSIS — E440 Moderate protein-calorie malnutrition: Secondary | ICD-10-CM | POA: Diagnosis not present

## 2019-05-20 IMAGING — CT CT HEAD WITHOUT CONTRAST
4 series · 16 of 47 positions shown, 18 images · non-contrast
Comparison: None.

CLINICAL DATA: Change in mental status.

EXAM:
CT HEAD WITHOUT CONTRAST
TECHNIQUE: Contiguous axial images were obtained from the base of the skull
through the vertex without intravenous contrast.

[Series 3: head wo · axial · 0.38mm/px · z∈[-208,-92]mm · 7 of 31 slices shown, 9 images]
[im 4/31  brain]
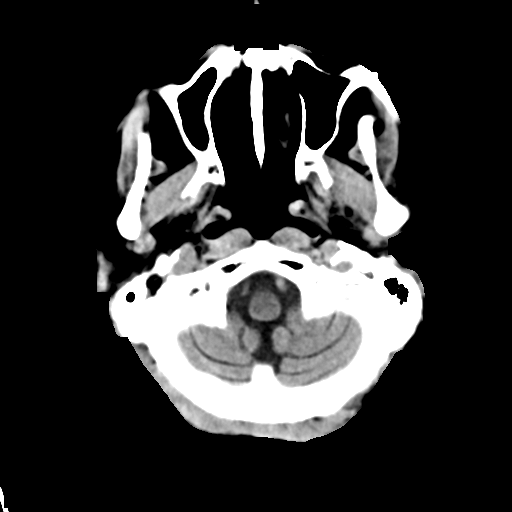
[im 4/31  bone]
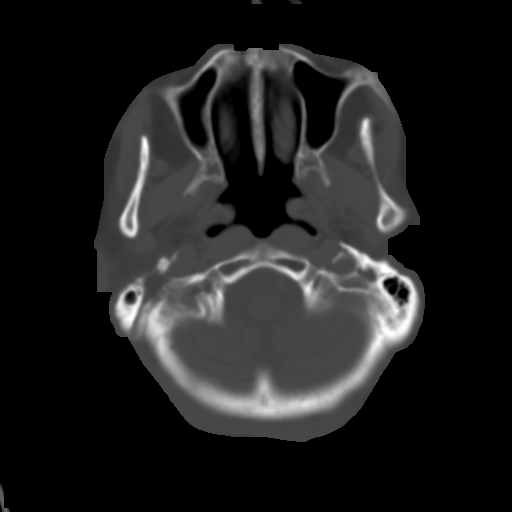
[im 8/31  brain]
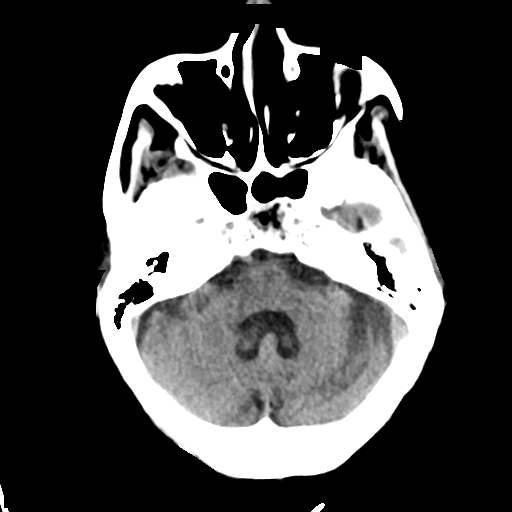
[im 12/31  brain]
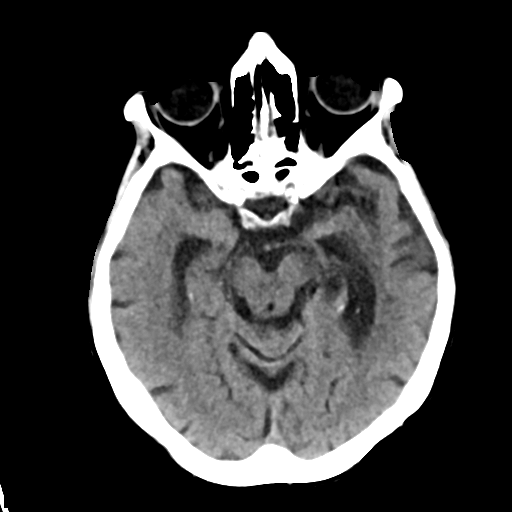
[im 16/31  brain]
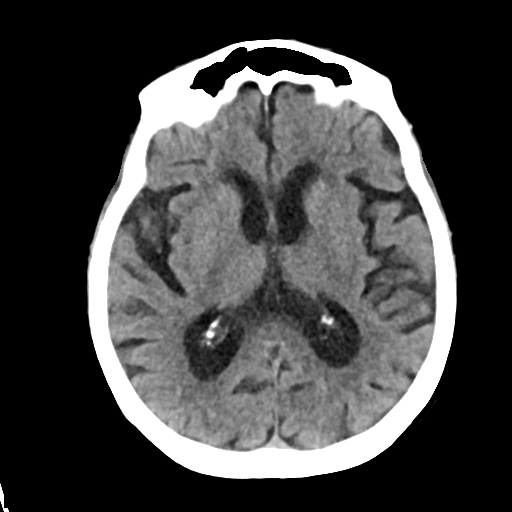
[im 19/31  brain]
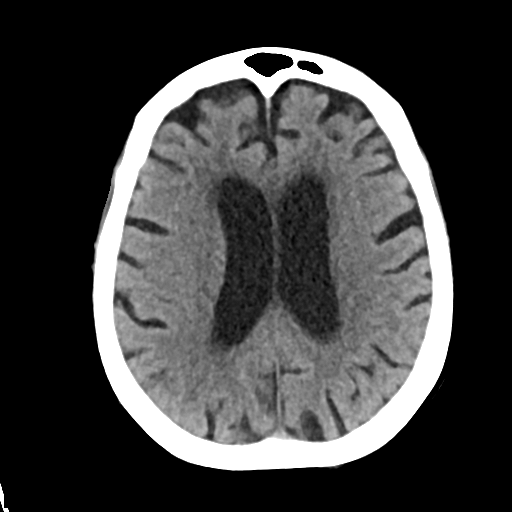
[im 19/31  bone]
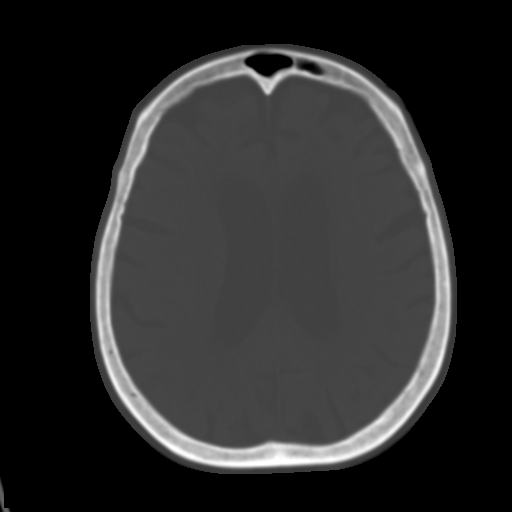
[im 23/31  brain]
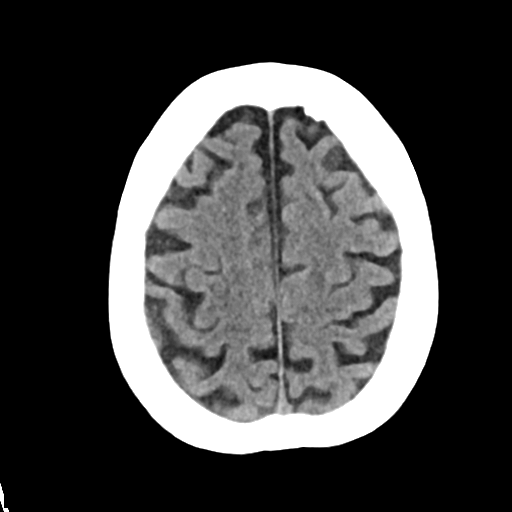
[im 27/31  brain]
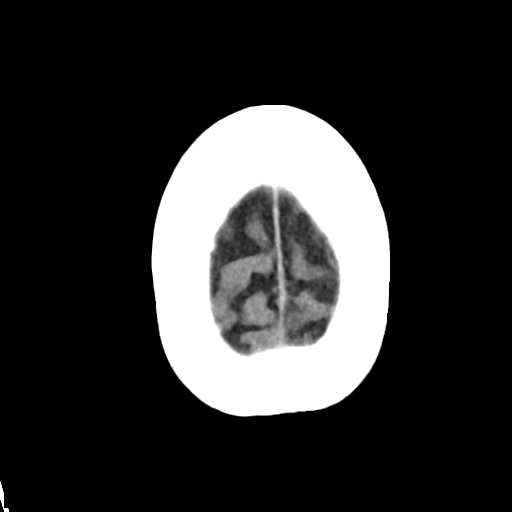

[Series 4: head bone · axial · 0.38mm/px · z∈[-208,-178]mm · 3 of 76 slices shown]
[im 8/76  bone]
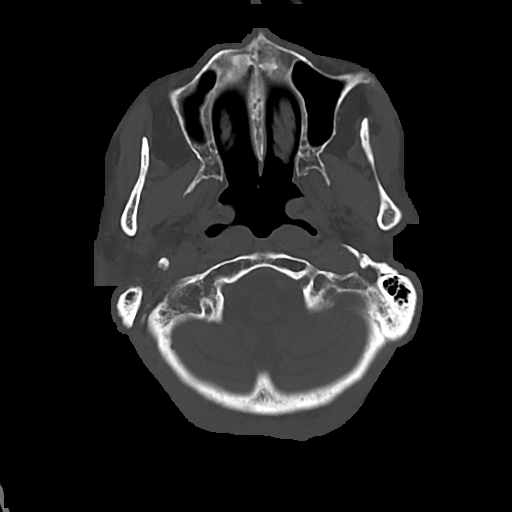
[im 16/76  bone]
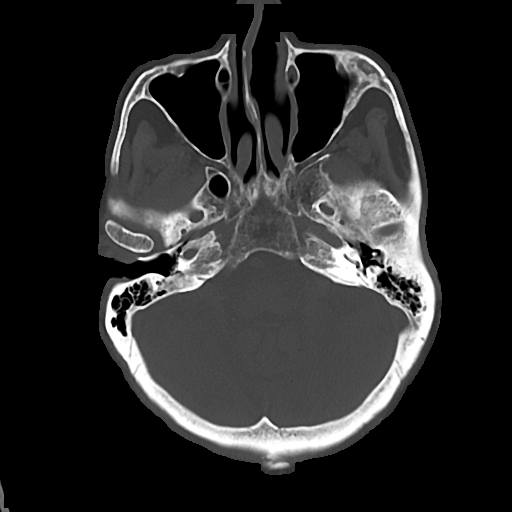
[im 23/76  bone]
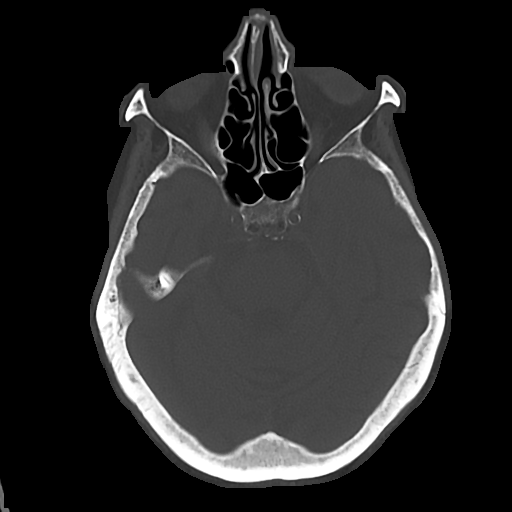

[Series 5: cor soft · coronal · 0.33mm/px · 3 of 59 slices shown]
[im 20/59  brain]
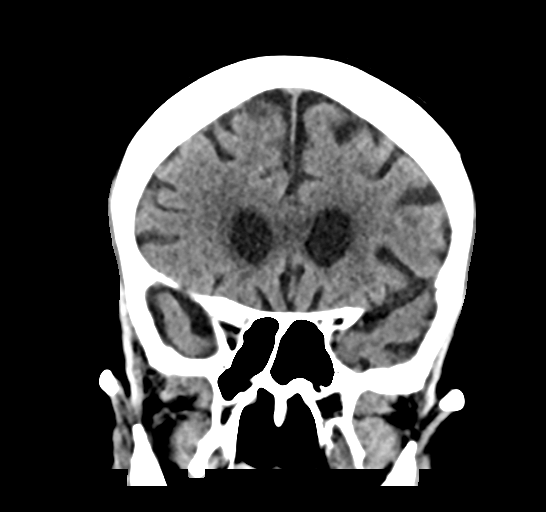
[im 26/59  brain]
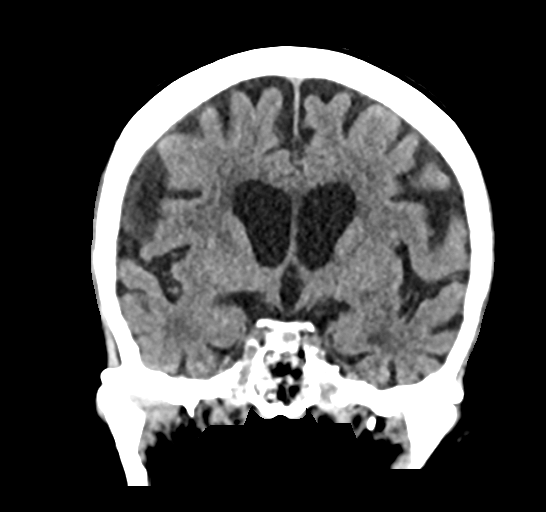
[im 33/59  brain]
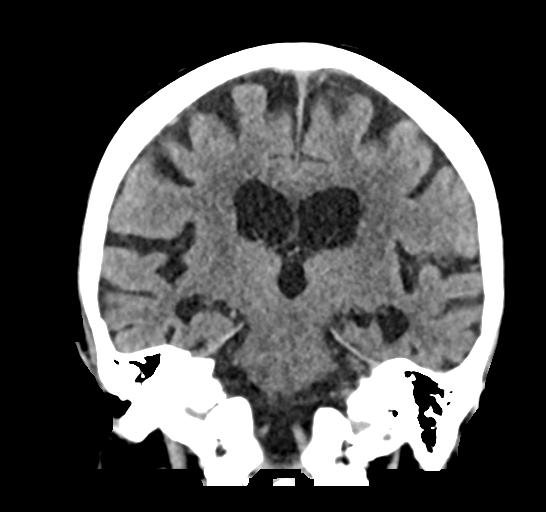

[Series 6: sag soft · sagittal · 0.33mm/px · 3 of 51 slices shown]
[im 17/51  brain]
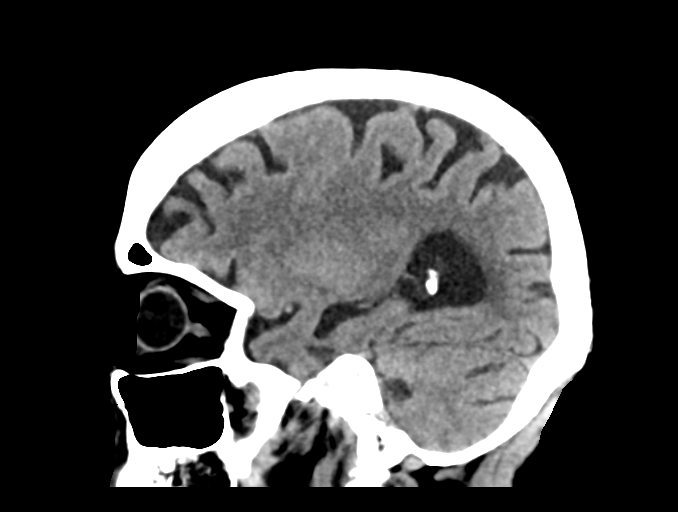
[im 26/51  brain]
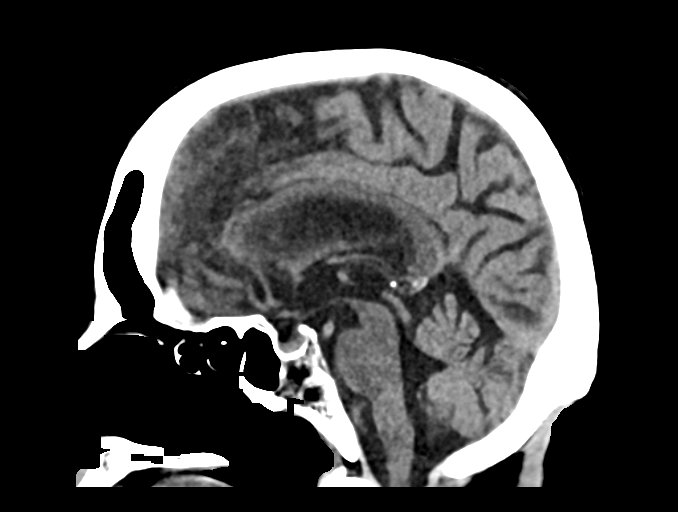
[im 34/51  brain]
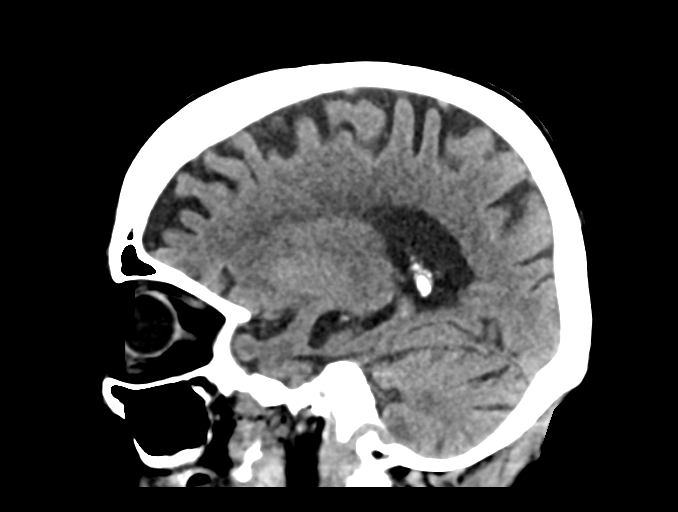

[16 of 47 positions shown; findings below may reference images not displayed]

FINDINGS: Brain: No evidence of acute stroke, acute hemorrhage, mass lesion,
or extra-axial fluid

Advanced atrophy, hydrocephalus ex vacuo. Hypoattenuation of white
matter, likely small vessel disease.

Vascular: Calcification of the cavernous internal carotid arteries
consistent with cerebrovascular atherosclerotic disease. No signs of
intracranial large vessel occlusion.

Skull: Calvarium intact.

Sinuses/Orbits: No sinus disease. Negative orbits, BILATERAL
cataract extraction.

Other: Poor dentition. Single RIGHT maxillary canine or premolar has
significant periodontal infection, surrounding osseous lucency; see
series 4, image 3.
IMPRESSION: Atrophy and small vessel disease. No acute intracranial findings.

Poor dentition, with significant periodontal disease in the RIGHT
maxilla.

## 2019-05-20 IMAGING — DX PORTABLE CHEST - 1 VIEW
1 series · 1 of 1 positions shown · non-contrast
Comparison: 11/24/2018; 07/05/2016

CLINICAL DATA: Cough

EXAM:
PORTABLE CHEST 1 VIEW

[chest ap]
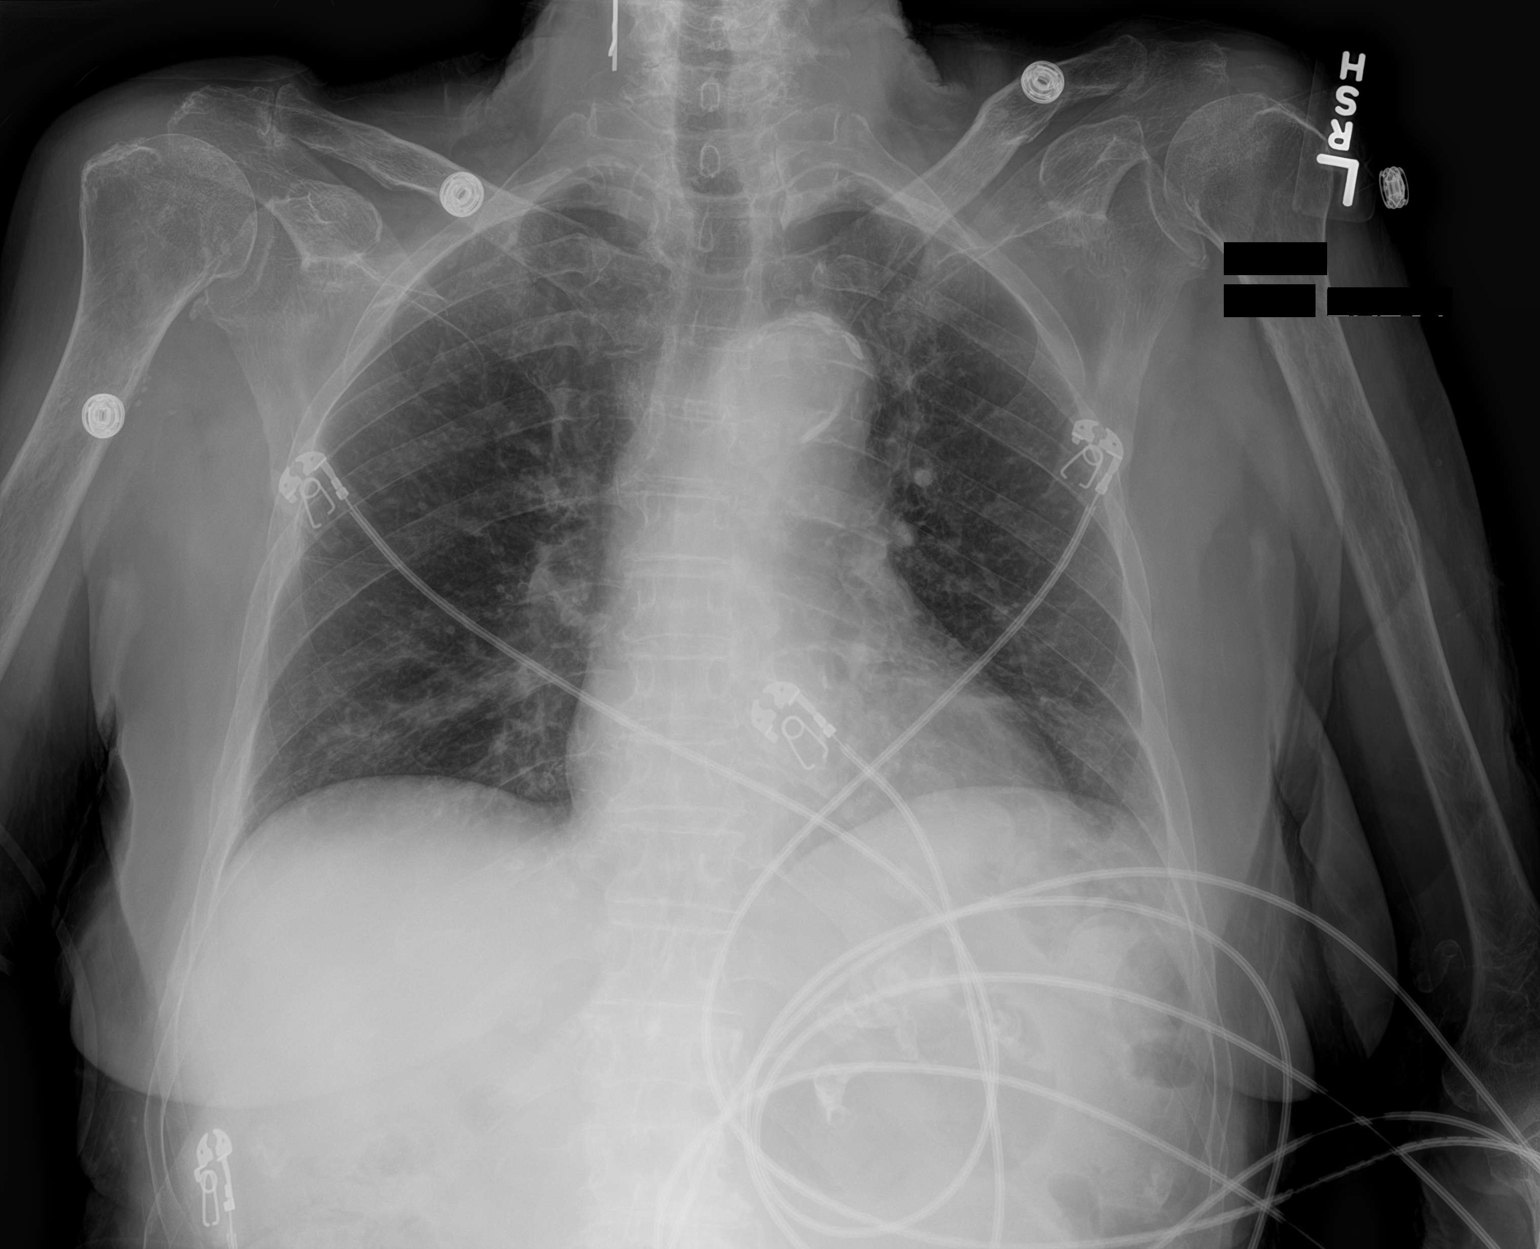

[1 of 1 positions shown; findings below may reference images not displayed]

FINDINGS: Grossly unchanged cardiac silhouette and mediastinal contours with
atherosclerotic plaque within the thoracic aorta. The lungs are
hyperexpanded with mild diffuse slightly nodular thickening of
pulmonary interstitium. Grossly unchanged bilateral infrahilar
heterogeneous opacities, left greater than right. No new focal
airspace opacities. No pleural effusion or pneumothorax. No evidence
of edema. No acute osseous abnormalities.
IMPRESSION: Grossly unchanged bilateral infrahilar opacities, atelectasis versus
infiltrate.

## 2019-06-03 ENCOUNTER — Encounter: Payer: Self-pay | Admitting: Internal Medicine

## 2019-06-03 ENCOUNTER — Ambulatory Visit (INDEPENDENT_AMBULATORY_CARE_PROVIDER_SITE_OTHER): Payer: Medicare HMO | Admitting: Internal Medicine

## 2019-06-03 ENCOUNTER — Ambulatory Visit (HOSPITAL_COMMUNITY)
Admission: RE | Admit: 2019-06-03 | Discharge: 2019-06-03 | Disposition: A | Discharge status: Home / Self Care | Payer: Medicare HMO | Source: Ambulatory Visit | Attending: Internal Medicine | Admitting: Internal Medicine

## 2019-06-03 VITALS — BP 146/55 | HR 50 | Temp 98.8°F | Ht 61.0 in | Wt 100.0 lb

## 2019-06-03 DIAGNOSIS — R001 Bradycardia, unspecified: Secondary | ICD-10-CM

## 2019-06-03 DIAGNOSIS — E118 Type 2 diabetes mellitus with unspecified complications: Secondary | ICD-10-CM

## 2019-06-03 DIAGNOSIS — Z87448 Personal history of other diseases of urinary system: Secondary | ICD-10-CM | POA: Diagnosis not present

## 2019-06-03 DIAGNOSIS — Z23 Encounter for immunization: Secondary | ICD-10-CM

## 2019-06-03 DIAGNOSIS — G309 Alzheimer's disease, unspecified: Secondary | ICD-10-CM

## 2019-06-03 DIAGNOSIS — R69 Illness, unspecified: Secondary | ICD-10-CM | POA: Diagnosis not present

## 2019-06-03 DIAGNOSIS — E1165 Type 2 diabetes mellitus with hyperglycemia: Secondary | ICD-10-CM

## 2019-06-03 DIAGNOSIS — J449 Chronic obstructive pulmonary disease, unspecified: Secondary | ICD-10-CM | POA: Diagnosis not present

## 2019-06-03 DIAGNOSIS — F0280 Dementia in other diseases classified elsewhere without behavioral disturbance: Secondary | ICD-10-CM | POA: Diagnosis not present

## 2019-06-03 DIAGNOSIS — Z794 Long term (current) use of insulin: Secondary | ICD-10-CM | POA: Diagnosis not present

## 2019-06-03 LAB — POCT GLYCOSYLATED HEMOGLOBIN (HGB A1C): Hemoglobin A1C: 10.5 % — AB (ref 4.0–5.6)

## 2019-06-03 LAB — GLUCOSE, CAPILLARY: Glucose-Capillary: 280 mg/dL — ABNORMAL HIGH (ref 70–99)

## 2019-06-03 NOTE — Patient Instructions (Addendum)
Dear [name],  Thank you for allowing [place] to provide your care today. Today we discussed your diabetes and heart rate  Today we made no changes to your medications  Please follow-up in 3 months.    Should you have any questions or concerns please call the internal medicine clinic at [phone].    Thank you for choosing [facility].   Bradicardia, en adultos Bradycardia, Adult La bradicardia es una frecuencia cardaca ms lenta que lo normal. El corazn late [employer] 89 y 100 veces por minuto en un adulto, en reposo. Si presenta bradicardia, la frecuencia cardaca en reposo es inferior a 60latidos por minuto. La bradicardia puede impedir que llegue la cantidad suficiente de oxgeno a ciertas zonas del cuerpo cuando est realizando [place]. Puede ser grave si impide que llegue la suficiente cantidad de oxgeno al cerebro y a otras partes del cuerpo. La bradicardia no representa un problema para todas las personas. Para algunos adultos sanos, una frecuencia cardaca lenta en reposo es normal. Cules son las causas? Esta afeccin puede ser causada por lo siguiente:  Problemas con el corazn, estos incluyen: ? Un problema con el sistema elctrico del corazn, por ejemplo, un bloqueo cardaco. Con un bloqueo cardaco, las seales elctricas entre las cavidades del corazn estn parcial o completamente cerradas, de modo que no pueden funcionar como deberan. ? Un problema con el marcapasos natural del corazn (ndulo sinusal). ? Enfermedad cardaca. ? Infarto de miocardio. ? Dao cardaco. ? Enfermedad de [place]. ? Infeccin en el corazn. ? Afeccin cardaca que est presente al nacer (defecto cardaco congnito).  Determinados medicamentos para el tratamiento de afecciones cardacas.  Determinadas afecciones, como hipotiroidismo y apnea obstructiva del sueo.  Problemas con el equilibrio de sustancias qumicas y otras sustancias en la sangre, como el  [place].  Traumatismos.  Radioterapia. Qu incrementa el riesgo? Es ms probable que tenga esta afeccin si:  Es mayor de 56aos de edad.  Tiene presin arterial alta (hipertensin), colesterol alto (hiperlipidemia) o diabetes.  Toma mucho alcohol, consume productos con tabaco o nicotina o [place] drogas. Cules son los signos o los sntomas? Los sntomas de esta afeccin incluyen:  Desvanecimiento.  Desmayo o sensacin de desvanecimiento.  Fatiga y debilidad.  Dificultad para realizar Scientist, forensic ejercicio.  Falta de aire.  Dolor en el pecho (angina).  Somnolencia.  Confusin.  Mareos. Cmo se diagnostica? Esta afeccin se puede diagnosticar en funcin de lo siguiente:  Sus sntomas.  Sus antecedentes mdicos.  Un examen fsico. Durante el examen, el mdico auscultar la frecuencia cardaca y controlar el pulso. El mdico tambin puede indicarle estudios para confirmar el diagnstico, como los siguientes:  Anlisis de [place].  Electrocardiograma (ECG). El electrocardiograma registra la actividad elctrica del corazn. El estudio puede mostrar el ritmo del latido cardaco y si la frecuencia cardaca se mantiene.  Un estudio en el que se utiliza un dispositivo porttil (grabadora de eventos) o Holter para registrar la actividad elctrica del corazn mientras realiza sus actividades cotidianas.  Una prueba de ejercicio. Cmo se trata? El tratamiento de esta afeccin depende de la intensidad de los sntomas y de la causa de Engineer, manufacturing systems. El tratamiento puede implicar lo siguiente:  Tratamiento de la afeccin preexistente.  Cambiar los medicamentos o la dosis de [place].  Implantar debajo de la piel un dispositivo a batera pequeo llamado marcapasos. Cuando se produce un episodio de bradicardia, este dispositivo puede utilizarse para aumentar la frecuencia cardaca y ayudar a que su corazn lata  a un ritmo constante. Siga estas indicaciones en su  casa: Estilo de vida   Siga las indicaciones del mdico para controlar cualquier enfermedad que contribuya a la bradicardia.  Siga una dieta cardiosaludable. Un especialista en nutricin (nutricionista) puede darle instrucciones sobre opciones y variantes de alimentos saludables.  Siga un programa de actividad fsica aprobado por el mdico.  Mantenga un peso saludable.  Trate de reducir o Geophysical data processor de estrs con actividades como el yoga o la meditacin. Si necesita ayuda para reducir [employer] de estrs, consulte al mdico.  No consuma ningn producto que contenga nicotina o tabaco, como cigarrillos, cigarrillos electrnicos y tabaco de Theatre manager. Si necesita ayuda para dejar de consumir, consulte al mdico.  No consuma drogas ilegales.  Limite el consumo de alcohol a no ms de por da si es mujer y no est [place], y por da si es hombre. Est atento a la cantidad de alcohol que hay en las bebidas que toma. En los [place], una medida equivale a una botella de cerveza de 12oz ( ), un vaso de vino de 5oz ( ) o un vaso de una bebida alcohlica de alta graduacin de 1oz (17ml). Indicaciones generales  [employer] de venta libre y los recetados solamente como se lo haya indicado el mdico.  Oceanographer a todas las visitas de control como se lo haya indicado el mdico. Esto es importante. Cmo se evita? En algunos casos, la bradicardia se puede evitar a travs de:  El tratamiento de problemas mdicos preexistentes.  Evitar las conductas o medicamentos que pueden Teacher, music. Comunquese con un mdico si:  Se siente mareado o siente que va a desvanecerse.  Casi se desmaya.  Se siente dbil o se fatiga con facilidad durante la actividad fsica.  Siente confusin o tiene problemas de [place]. Solicite ayuda inmediatamente si:  Se desmaya.  Tiene los siguientes sntomas: ? Latidos cardacos irregulares  (palpitaciones). ? Journalist, newspaper. ? Dificultad para respirar. Resumen  La bradicardia es una frecuencia cardaca ms lenta que lo normal. Si presenta bradicardia, la frecuencia cardaca en reposo es inferior a 60latidos por minuto.  El tratamiento de esta afeccin depende de la causa.  Siga las indicaciones del mdico para controlar cualquier enfermedad que contribuya a la bradicardia.  No consuma ningn producto que contenga nicotina o tabaco, como cigarrillos, cigarrillos electrnicos y tabaco de Theatre manager, y limite el consumo de alcohol.  Concurra a todas las visitas de control como se lo haya indicado el mdico. Esto es importante. Esta informacin no tiene Theme park manager el consejo del mdico. Asegrese de hacerle al mdico cualquier pregunta que tenga. Document Released: 10/27/2008 Document Revised: 02/19/2018 Document Reviewed: 02/19/2018 Elsevier Patient Education  2020 [employer].

## 2019-06-03 NOTE — Progress Notes (Signed)
   CC: Bradycardia  HPI: Sharon Butler is a 85 y.o. F w/ end stage Alzheimer's dementia, COPD, T2DM who presents with slow heart rate. Sharon Butler is accompanied by her daughter, Almarie Kurdziel, who provides history. She mentions that since her prior visit, she has been doing well at home. Since her course of doxycycline, her perineal abscess appears to have resolved and Sharon Butler appears comfortable and well. No complaints at this time.  Past Medical History:  Diagnosis Date  . Alzheimer's dementia ([place])    "dx'd 05/2015; final stages" (06/15/2016)  . Complication of anesthesia    "problems waking up from gallbladder OR in 1978"  . COPD (chronic obstructive pulmonary disease) ([place])   . High cholesterol   . History of blood transfusion 1996   "related to appendix OR"  . Infected surgical wound 07/2016   rt hip  . Type II diabetes mellitus ([place]) 1998    Review of Systems: Review of Systems  Unable to perform ROS: Dementia    Physical Exam: Vitals:   06/03/19 1607  BP: (!) 146/55  Pulse: (!) 50  Temp: 98.8 F (37.1 C)  TempSrc: Oral  SpO2: 100%  Weight: 100 lb (45.4 kg)  Height: 5\' 1"  (1.549 m)    Physical Exam  Constitutional: She appears well-developed and well-nourished. No distress.  HENT:  Mouth/Throat: Oropharynx is clear and moist.  Cardiovascular: Regular rhythm, normal heart sounds and intact distal pulses.  No murmur heard. Bradycardic  Respiratory: Effort normal and breath sounds normal. She has no wheezes. She has no rales.  GI: Soft. Bowel sounds are normal. She exhibits no distension. There is no abdominal tenderness.  Musculoskeletal: Normal range of motion.        General: No edema.  Neurological: She is alert.  Not oriented, not conversive  Skin: Skin is warm and dry.    Assessment & Plan:   Poorly controlled type 2 diabetes mellitus ([place]) Lab Results  Component Value Date   HGBA1C 10.5 (A) 06/03/2019   Continuing to have uncontrolled  diabetes but currently working with palliative for hospice. Hgb a1c stable at 10.5. Foot exam performed w/o any obvious infection or wounds. Daughter mentions adherence to insulin regimen.  - C/w current regimen @ Levemir 20   Bradycardia with 41-50 beats per minute On exam noted to be bradycardic w/ HR <50. Per chart review, had prior hx of intermittent sinus bradycardia. Discussed with daughter regarding getting EKG vs observation. Through shared decision making, decided to get EKG to help with prognosis, especially in the event of development of new heart block. EKG performed in clinic showing sinus bradycardia with no heart block. Reassured Ms.Martorana's daughter  - C/w monitor  Need for immunization against influenza Agrees to receive flu shot in clinic today.    Patient discussed with Dr. Philipp Ovens    -Gilberto Better, PGY2 [facility] Pager: [phone]

## 2019-06-04 ENCOUNTER — Encounter: Payer: Self-pay | Admitting: Internal Medicine

## 2019-06-04 ENCOUNTER — Other Ambulatory Visit: Payer: Self-pay | Admitting: Internal Medicine

## 2019-06-04 DIAGNOSIS — Z23 Encounter for immunization: Secondary | ICD-10-CM | POA: Insufficient documentation

## 2019-06-04 DIAGNOSIS — R001 Bradycardia, unspecified: Secondary | ICD-10-CM | POA: Insufficient documentation

## 2019-06-04 DIAGNOSIS — E1165 Type 2 diabetes mellitus with hyperglycemia: Secondary | ICD-10-CM

## 2019-06-04 NOTE — Assessment & Plan Note (Signed)
Agrees to receive flu shot in clinic today 

## 2019-06-04 NOTE — Assessment & Plan Note (Signed)
On exam noted to be bradycardic w/ HR <50. Per chart review, had prior hx of intermittent sinus bradycardia. Discussed with daughter regarding getting EKG vs observation. Through shared decision making, decided to get EKG to help with prognosis, especially in the event of development of new heart block. EKG performed in clinic showing sinus bradycardia with no heart block. Reassured Sharon Butler's daughter  - C/w monitor

## 2019-06-04 NOTE — Assessment & Plan Note (Signed)
Lab Results  Component Value Date   HGBA1C 10.5 (A) 06/03/2019   Continuing to have uncontrolled diabetes but currently working with palliative for hospice. Hgb a1c stable at 10.5. Foot exam performed w/o any obvious infection or wounds. Daughter mentions adherence to insulin regimen.  - C/w current regimen @ Levemir 20

## 2019-06-06 NOTE — Progress Notes (Signed)
Internal Medicine Clinic Attending ° °Case discussed with Dr. Lee at the time of the visit.  We reviewed the resident’s history and exam and pertinent patient test results.  I agree with the assessment, diagnosis, and plan of care documented in the resident’s note.  °

## 2019-06-14 DIAGNOSIS — R69 Illness, unspecified: Secondary | ICD-10-CM | POA: Diagnosis not present

## 2019-06-14 DIAGNOSIS — E440 Moderate protein-calorie malnutrition: Secondary | ICD-10-CM | POA: Diagnosis not present

## 2019-06-14 DIAGNOSIS — R262 Difficulty in walking, not elsewhere classified: Secondary | ICD-10-CM | POA: Diagnosis not present

## 2019-06-26 ENCOUNTER — Telehealth: Payer: Self-pay | Admitting: *Deleted

## 2019-06-26 NOTE — Telephone Encounter (Signed)
11/12 hr 46 11/19 hr 48 12/02 hr 44 bp 128/58 Tracey, [facility], calls and reports this, states pt is more alert recently, more talkative and amb on her own some She also ask about checking TSH?

## 2019-06-28 NOTE — Telephone Encounter (Signed)
They will be in mon at 1445

## 2019-06-28 NOTE — Telephone Encounter (Signed)
Can the patient come in for evaluation? Would want to make sure is not anything else. Like a cardiac problem.

## 2019-07-01 ENCOUNTER — Encounter: Payer: Medicare HMO | Admitting: Internal Medicine

## 2019-07-14 DIAGNOSIS — E440 Moderate protein-calorie malnutrition: Secondary | ICD-10-CM | POA: Diagnosis not present

## 2019-07-14 DIAGNOSIS — R262 Difficulty in walking, not elsewhere classified: Secondary | ICD-10-CM | POA: Diagnosis not present

## 2019-07-14 DIAGNOSIS — R69 Illness, unspecified: Secondary | ICD-10-CM | POA: Diagnosis not present

## 2019-07-15 ENCOUNTER — Ambulatory Visit (INDEPENDENT_AMBULATORY_CARE_PROVIDER_SITE_OTHER): Payer: Medicare HMO | Admitting: Internal Medicine

## 2019-07-15 VITALS — BP 149/48 | HR 48 | Temp 98.2°F | Wt 100.4 lb

## 2019-07-15 DIAGNOSIS — I4510 Unspecified right bundle-branch block: Secondary | ICD-10-CM

## 2019-07-15 DIAGNOSIS — R001 Bradycardia, unspecified: Secondary | ICD-10-CM

## 2019-07-15 NOTE — Patient Instructions (Signed)
Sharon Butler,   I am glad you are feeling well! Come back to see me if you have any issues.   Happy Holidays!   - Dr. Frederico Hamman

## 2019-07-16 ENCOUNTER — Encounter: Payer: Self-pay | Admitting: Internal Medicine

## 2019-07-16 NOTE — Progress Notes (Signed)
   CC: Bradycardia follow up  HPI:  [name] is a 85 y.o. year-old female with PMH listed below who presents to clinic for bradycardia follow up. Please see problem based assessment and plan for further details.   Past Medical History:  Diagnosis Date  . Alzheimer's dementia ([place])    "dx'd 05/2015; final stages" (06/15/2016)  . Complication of anesthesia    "problems waking up from gallbladder OR in 1978"  . COPD (chronic obstructive pulmonary disease) ([place])   . High cholesterol   . History of blood transfusion 1996   "related to appendix OR"  . Infected surgical wound 07/2016   rt hip  . Type II diabetes mellitus ([place]) 1998   Review of Systems:   Review of Systems  Constitutional: Negative for chills, fever, malaise/fatigue and weight loss.  Respiratory: Negative for cough and shortness of breath.   Cardiovascular: Negative for chest pain.  Musculoskeletal: Negative for back pain, falls, joint pain and myalgias.  Neurological: Negative for dizziness and headaches.    Physical Exam:  Vitals:   07/15/19 1434  BP: (!) 149/48  Pulse: (!) 48  Temp: 98.2 F (36.8 C)  TempSrc: Oral  SpO2: 96%  Weight: 100 lb 6.4 oz (45.5 kg)    General: elderly female, well-appearing, in NAD  Cardiac: bradycardic, nl S1/S2, no murmurs, rubs or gallops Pulm: CTAB, no wheezes or crackles, no increased work of breathing on room air   Assessment & Plan:   See Encounters Tab for problem based charting.  Patient discussed with Dr. Evette Doffing

## 2019-07-16 NOTE — Assessment & Plan Note (Signed)
Patient presents for follow-up of bradycardia.  She was seen last month for this when an EKG was done and showed sinus bradycardia with an incomplete right bundle branch block.  Her pulse today is 48.  She is asymptomatic.  Very talkative and active today.  Denies any complaints today.  Provided reassurance to her and her daughter who is her HCPOA. Will continue to monitor.

## 2019-07-16 NOTE — Progress Notes (Signed)
Internal Medicine Clinic Attending  Case discussed with Dr. Santos-Sanchez at the time of the visit.  We reviewed the resident's history and exam and pertinent patient test results.  I agree with the assessment, diagnosis, and plan of care documented in the resident's note.    

## 2019-07-30 ENCOUNTER — Telehealth: Payer: Self-pay | Admitting: *Deleted

## 2019-08-01 DIAGNOSIS — N390 Urinary tract infection, site not specified: Secondary | ICD-10-CM | POA: Diagnosis not present

## 2019-08-05 ENCOUNTER — Telehealth: Payer: Self-pay | Admitting: *Deleted

## 2019-08-05 NOTE — Telephone Encounter (Signed)
Received fax from [facility] of [facility] with cx result - given to Dr Criselda Peaches (Attending) this am; instructed to notify Dr Lovenia Kim who I sent a  text message to f/u. I will place result in PCP's mailbox.

## 2019-08-06 ENCOUNTER — Telehealth: Payer: Self-pay | Admitting: Internal Medicine

## 2019-08-06 DIAGNOSIS — N3000 Acute cystitis without hematuria: Secondary | ICD-10-CM

## 2019-08-06 MED ORDER — SULFAMETHOXAZOLE-TRIMETHOPRIM 800-160 MG PO TABS: 1.0000 | ORAL_TABLET | Freq: Two times a day (BID) | ORAL | 0 refills | Status: DC

## 2019-08-06 NOTE — Telephone Encounter (Signed)
Called Tracey RN, [facility] to let her know of Bactrim rx; stated she will call pt's daughter .

## 2019-08-06 NOTE — Telephone Encounter (Signed)
Patient with a history of dementia and recurrent UTIs. Daughter noted patient had decreased po intake, decreased responsiveness, and some confusion. Home health RN checked UA and culture which grew >100,000 colonies of klebsiella pneumoniae, resistant to ampicillin only. Results placed in resident box for review. Given her symptoms, will treat with bactrim x 3 days. Prescription sent to her pharmacy.

## 2019-08-06 NOTE — Telephone Encounter (Signed)
I  Called /talked to [place], [employer] - stated she had talked to [facility] last week;daughter was concerned of pt sleeping more, decrease appetite, slightly more confusion. So when they went to see pt , pt's urine was cloudy with foul odor,c/o hurting when going to the bathroom w/o knowing why. So u/a was ordered.

## 2019-08-06 NOTE — Telephone Encounter (Addendum)
No response from PCP yet. Given to The Attending.

## 2019-08-06 NOTE — Telephone Encounter (Signed)
Call from [facility], [facility] in reference of U/A result.  I paged Dr Evelene CroonMordecai Maes.

## 2019-08-14 DIAGNOSIS — R69 Illness, unspecified: Secondary | ICD-10-CM | POA: Diagnosis not present

## 2019-08-14 DIAGNOSIS — E440 Moderate protein-calorie malnutrition: Secondary | ICD-10-CM | POA: Diagnosis not present

## 2019-08-14 DIAGNOSIS — R262 Difficulty in walking, not elsewhere classified: Secondary | ICD-10-CM | POA: Diagnosis not present

## 2019-08-26 ENCOUNTER — Telehealth: Payer: Self-pay | Admitting: *Deleted

## 2019-08-26 NOTE — Telephone Encounter (Signed)
tracey from [facility] calls with Tobi Bastos RN who saw pt last week and states the daughter, caregiver, wants a lot of bloodwork done by [facility] nurse, daughter is also having some health issues that are concerning but states she cant go to hosp or md because no one would be there to help pt. Dr Lovenia Kim could you please call daughter and speak w/ her about her concerns r/t patient

## 2019-08-28 NOTE — Telephone Encounter (Signed)
Have called patient x2. No answer. Will try again later today.

## 2019-08-29 NOTE — Telephone Encounter (Signed)
Dr Lovenia Kim, tracey from [facility] states she and the other nurse for pt have been trying to get in touch w/ daughter and other person of notification and they get a message that states call cannot be reached for days now. Would you like a wellness check requested by triage to [employer] dept?

## 2019-08-30 NOTE — Telephone Encounter (Signed)
[employer] calls back, deputy made contact with family, all is well, he ask why they have not answered or returned ph calls and he said they didn't have a reason.  Will relay to tracey at [facility] on monday

## 2019-08-30 NOTE — Telephone Encounter (Signed)
Triage has called non emergency line for [place] [phone] option #3 Spoke w/ dispatcher about doing a wellness check by [employer] they will do so and call clinic w/ results

## 2019-08-30 NOTE — Telephone Encounter (Signed)
Thank you for letting me know

## 2019-08-30 NOTE — Telephone Encounter (Signed)
Yes, please. I called her again yesterday afternoon and got the same message.

## 2019-09-02 ENCOUNTER — Telehealth: Payer: Self-pay | Admitting: *Deleted

## 2019-09-02 NOTE — Telephone Encounter (Signed)
Pt's daughter called to apologize for not calling back. She states she cannot get pt out very easy right now and then they have made her father stay in the car by himself and she cant do this. She would like for [facility] to check labs BMP would be fine, dont know if they can do A1C.  Please advise

## 2019-09-02 NOTE — Telephone Encounter (Signed)
Pts daughter

## 2019-09-04 ENCOUNTER — Other Ambulatory Visit (INDEPENDENT_AMBULATORY_CARE_PROVIDER_SITE_OTHER): Payer: Medicare HMO | Admitting: Internal Medicine

## 2019-09-04 DIAGNOSIS — E1165 Type 2 diabetes mellitus with hyperglycemia: Secondary | ICD-10-CM | POA: Diagnosis not present

## 2019-09-04 NOTE — Telephone Encounter (Signed)
Placed order for CBC and BMP.

## 2019-09-04 NOTE — Telephone Encounter (Signed)
Done

## 2019-09-04 NOTE — Telephone Encounter (Signed)
Tracey, [facility] states pt's daughter also wanted lipids and A1C done, will you add these to the orders?

## 2019-09-11 DIAGNOSIS — E119 Type 2 diabetes mellitus without complications: Secondary | ICD-10-CM | POA: Diagnosis not present

## 2019-09-11 DIAGNOSIS — E785 Hyperlipidemia, unspecified: Secondary | ICD-10-CM | POA: Diagnosis not present

## 2019-09-13 NOTE — Telephone Encounter (Signed)
Lab results drawn 09/12/2019 handed to PCP for review and signature. Kinnie Feil, BSN, RN-BC

## 2019-09-14 DIAGNOSIS — R69 Illness, unspecified: Secondary | ICD-10-CM | POA: Diagnosis not present

## 2019-09-14 DIAGNOSIS — E440 Moderate protein-calorie malnutrition: Secondary | ICD-10-CM | POA: Diagnosis not present

## 2019-09-14 DIAGNOSIS — R262 Difficulty in walking, not elsewhere classified: Secondary | ICD-10-CM | POA: Diagnosis not present

## 2019-09-16 LAB — POCT GLYCOSYLATED HEMOGLOBIN (HGB A1C): Hemoglobin A1C: 10.4 % — AB (ref 4.0–5.6)

## 2019-09-21 ENCOUNTER — Other Ambulatory Visit: Payer: Self-pay | Admitting: Internal Medicine

## 2019-09-21 DIAGNOSIS — Z86718 Personal history of other venous thrombosis and embolism: Secondary | ICD-10-CM

## 2019-09-25 ENCOUNTER — Other Ambulatory Visit: Payer: Self-pay | Admitting: Internal Medicine

## 2019-09-25 DIAGNOSIS — Z86718 Personal history of other venous thrombosis and embolism: Secondary | ICD-10-CM

## 2019-10-12 DIAGNOSIS — E440 Moderate protein-calorie malnutrition: Secondary | ICD-10-CM | POA: Diagnosis not present

## 2019-10-12 DIAGNOSIS — R69 Illness, unspecified: Secondary | ICD-10-CM | POA: Diagnosis not present

## 2019-10-12 DIAGNOSIS — R262 Difficulty in walking, not elsewhere classified: Secondary | ICD-10-CM | POA: Diagnosis not present

## 2019-10-16 ENCOUNTER — Telehealth: Payer: Self-pay | Admitting: *Deleted

## 2019-10-16 MED ORDER — NITROFURANTOIN MONOHYD MACRO 100 MG PO CAPS: 100.0000 mg | ORAL_CAPSULE | Freq: Two times a day (BID) | ORAL | 0 refills | Status: AC

## 2019-10-16 NOTE — Telephone Encounter (Signed)
Sharon Butler calls and states pt is more alert today. She answered in 1 word sentences. She does have an area at coccyx that appears to be about 4cm, red w/ dark edging, daughter has been applying neosporin to area. Would you want to try something else?  Urine results given to dr Criselda Peaches

## 2019-10-16 NOTE — Telephone Encounter (Signed)
CBG 54 to 514 in the last week, last 3 days 50's and 60's. Not eating much. Drinking pretty good. Urine looks like chicken broth, not voiding very much at all.BM's are small and solid. [facility] did a urine Sunday, it was a voided specimen. She is asking about results, will track them down.

## 2019-10-16 NOTE — Telephone Encounter (Signed)
Called daughter and informed her of script

## 2019-10-16 NOTE — Telephone Encounter (Signed)
Discussed with Dr. Evelene Croon.  Will send in Macrobid 100mg  BID for 5 days.  , MD

## 2019-10-28 ENCOUNTER — Telehealth: Payer: Self-pay

## 2019-10-28 NOTE — Telephone Encounter (Signed)
Dr. Evelene Croon is the PCP and knows the family very well so I will defer to her judgment.

## 2019-10-28 NOTE — Telephone Encounter (Signed)
Received TC from Tammy with [facility] who states patient is currently with [facility], but has been declining.  Tammy states last week the patient was discussed and hospice MD's felt was appropriate to change her over to hospice care.  Patient's family is now ready for this.  Tammy wants to know if: 1.  Dr. Rogelia Boga is in agreement and 2.  If Dr. Rogelia Boga would like to remain PCP or if she is okay with letting one of the Hospice MD's assume care.  RN informed Tammy that per chart, Dr. Lovenia Kim is listed as pt's PCP.  Tammy states they have Dr. Donnelly Stager name listed.  Will forward to PCP and Dr. Rogelia Boga. SChaplin, RN,BSN

## 2019-10-28 NOTE — Telephone Encounter (Signed)
Patient and her family are very well known to me. I agree with changing patient to hospice. She has been declining for quite some time now.   I am also ok with letting MD at hospice assume care. If family prefers me to stay on as PCP it's ok with me too.

## 2019-10-31 NOTE — Telephone Encounter (Signed)
TC to Sharon Butler at [facility].  She was notified that Dr. Evelene Croon is indeed the PCP on record, not Dr. Rogelia Boga.  Sharon Butler also notified that per Dr. Evelene Croon, she is in agreement in changing pt over to hospice, as well as, letting hospice MD assume care (unless family wishes for Dr. Evelene Croon to remain PCP).    Sharon Butler states admission is scheduled for today and she believes hospice MD will assume care.  This RN asked Sharon Butler to notify [facility] after admission to notify us of final PCP decision and she states she will. SChaplin, RN,BSN

## 2019-11-12 DIAGNOSIS — E440 Moderate protein-calorie malnutrition: Secondary | ICD-10-CM | POA: Diagnosis not present

## 2019-11-12 DIAGNOSIS — R69 Illness, unspecified: Secondary | ICD-10-CM | POA: Diagnosis not present

## 2019-11-12 DIAGNOSIS — R262 Difficulty in walking, not elsewhere classified: Secondary | ICD-10-CM | POA: Diagnosis not present

## 2019-11-18 ENCOUNTER — Encounter: Payer: Self-pay | Admitting: *Deleted

## 2019-12-12 DIAGNOSIS — E440 Moderate protein-calorie malnutrition: Secondary | ICD-10-CM | POA: Diagnosis not present

## 2019-12-12 DIAGNOSIS — R69 Illness, unspecified: Secondary | ICD-10-CM | POA: Diagnosis not present

## 2019-12-12 DIAGNOSIS — R262 Difficulty in walking, not elsewhere classified: Secondary | ICD-10-CM | POA: Diagnosis not present

## 2020-03-26 NOTE — Telephone Encounter (Signed)
Review meds
